# Patient Record
Sex: Male | Born: 1950 | Race: White | Hispanic: No | Marital: Married | Smoking: Never smoker
Health system: Southern US, Community
[De-identification: ages and names within clinical notes are randomized; demographics above are authoritative.]

## PROBLEM LIST (undated history)

## (undated) DIAGNOSIS — H269 Unspecified cataract: Secondary | ICD-10-CM

## (undated) DIAGNOSIS — Z789 Other specified health status: Secondary | ICD-10-CM

## (undated) DIAGNOSIS — K429 Umbilical hernia without obstruction or gangrene: Secondary | ICD-10-CM

## (undated) DIAGNOSIS — C61 Malignant neoplasm of prostate: Secondary | ICD-10-CM

## (undated) DIAGNOSIS — E785 Hyperlipidemia, unspecified: Secondary | ICD-10-CM

## (undated) HISTORY — DX: Umbilical hernia without obstruction or gangrene: K42.9

## (undated) HISTORY — PX: COLONOSCOPY W/ POLYPECTOMY: SHX1380

## (undated) HISTORY — PX: TONSILLECTOMY: SUR1361

## (undated) HISTORY — DX: Hyperlipidemia, unspecified: E78.5

## (undated) HISTORY — DX: Unspecified cataract: H26.9

## (undated) HISTORY — PX: TONSILLECTOMY AND ADENOIDECTOMY: SHX28

## (undated) HISTORY — DX: Malignant neoplasm of prostate: C61

---

## 2011-08-03 ENCOUNTER — Encounter: Payer: Self-pay | Admitting: Internal Medicine

## 2011-08-08 ENCOUNTER — Ambulatory Visit (INDEPENDENT_AMBULATORY_CARE_PROVIDER_SITE_OTHER): Payer: BC Managed Care – PPO | Admitting: Internal Medicine

## 2011-08-08 ENCOUNTER — Encounter: Payer: Self-pay | Admitting: Internal Medicine

## 2011-08-08 DIAGNOSIS — Z1211 Encounter for screening for malignant neoplasm of colon: Secondary | ICD-10-CM

## 2011-08-08 DIAGNOSIS — R5383 Other fatigue: Secondary | ICD-10-CM

## 2011-08-08 DIAGNOSIS — Z13220 Encounter for screening for lipoid disorders: Secondary | ICD-10-CM

## 2011-08-08 DIAGNOSIS — R5381 Other malaise: Secondary | ICD-10-CM

## 2011-08-08 DIAGNOSIS — Z125 Encounter for screening for malignant neoplasm of prostate: Secondary | ICD-10-CM

## 2011-08-08 MED ORDER — LORATADINE 10 MG PO TABS: 10.0000 mg | ORAL_TABLET | Freq: Every day | ORAL | Status: DC

## 2011-08-08 NOTE — Progress Notes (Signed)
  Subjective:    [name], male    DOB: 04/18/1951, 60 y.o.   MRN: [id number]  HPI  Mr. Carlberg is a helathy 60 yr old male who is  Here for his annual physical exam.  He has no medical history, no new complaints.    Review of Systems  Constitutional: Negative for fever, chills, diaphoresis, activity change, appetite change, fatigue and unexpected weight change.  HENT: Negative for hearing loss, ear pain, nosebleeds, congestion, sore throat, facial swelling, rhinorrhea, sneezing, drooling, mouth sores, trouble swallowing, neck pain, neck stiffness, dental problem, voice change, postnasal drip, sinus pressure, tinnitus and ear discharge.   Eyes: Negative for photophobia, pain, discharge, redness, itching and visual disturbance.  Respiratory: Negative for apnea, cough, choking, chest tightness, shortness of breath, wheezing and stridor.   Cardiovascular: Negative for chest pain, palpitations and leg swelling.  Gastrointestinal: Negative for nausea, vomiting, abdominal pain, diarrhea, constipation, blood in stool, abdominal distention, anal bleeding and rectal pain.  Genitourinary: Negative for dysuria, urgency, frequency, hematuria, flank pain, decreased urine volume, scrotal swelling, difficulty urinating and testicular pain.  Musculoskeletal: Negative for myalgias, back pain, joint swelling, arthralgias and gait problem.  Skin: Negative for color change, rash and wound.  Neurological: Negative for dizziness, tremors, seizures, syncope, speech difficulty, weakness, light-headedness, numbness and headaches.  Psychiatric/Behavioral: Negative for suicidal ideas, hallucinations, behavioral problems, confusion, sleep disturbance, dysphoric mood, decreased concentration and agitation. The patient is not nervous/anxious.        Objective:   Physical Exam  Constitutional: He is oriented to person, place, and time.  HENT:  Head: Normocephalic and atraumatic.  Mouth/Throat: Oropharynx  is clear and moist.  Eyes: Conjunctivae and EOM are normal.  Neck: Normal range of motion. Neck supple. No JVD present. No thyromegaly present.  Cardiovascular: Normal rate, regular rhythm and normal heart sounds.   Pulmonary/Chest: Effort normal and breath sounds normal. He has no wheezes. He has no rales.  Abdominal: Soft. Bowel sounds are normal. He exhibits no mass. There is no tenderness. There is no rebound.  Genitourinary: Prostate normal and penis normal.  Musculoskeletal: Normal range of motion. He exhibits no edema.  Neurological: He is alert and oriented to person, place, and time.  Skin: Skin is warm and dry.  Psychiatric: He has a normal mood and affect.          Assessment & Plan:

## 2011-08-09 DIAGNOSIS — Z1211 Encounter for screening for malignant neoplasm of colon: Secondary | ICD-10-CM | POA: Insufficient documentation

## 2011-08-09 DIAGNOSIS — Z125 Encounter for screening for malignant neoplasm of prostate: Secondary | ICD-10-CM | POA: Insufficient documentation

## 2011-08-09 DIAGNOSIS — E785 Hyperlipidemia, unspecified: Secondary | ICD-10-CM | POA: Insufficient documentation

## 2011-08-09 NOTE — Assessment & Plan Note (Signed)
Prostate exam was normal.  He will return for a PSA early next week

## 2011-08-09 NOTE — Assessment & Plan Note (Signed)
He is up to date with screening for colon CA

## 2011-08-09 NOTE — Assessment & Plan Note (Signed)
Lipids have been normal in th past.,  Repeat fasting lipids due.

## 2011-08-14 ENCOUNTER — Other Ambulatory Visit (INDEPENDENT_AMBULATORY_CARE_PROVIDER_SITE_OTHER): Payer: BC Managed Care – PPO | Admitting: *Deleted

## 2011-08-14 DIAGNOSIS — R5381 Other malaise: Secondary | ICD-10-CM

## 2011-08-14 DIAGNOSIS — Z13220 Encounter for screening for lipoid disorders: Secondary | ICD-10-CM

## 2011-08-14 DIAGNOSIS — Z125 Encounter for screening for malignant neoplasm of prostate: Secondary | ICD-10-CM

## 2011-08-14 DIAGNOSIS — R5383 Other fatigue: Secondary | ICD-10-CM

## 2011-08-15 LAB — COMPREHENSIVE METABOLIC PANEL
ALT: 22 U/L (ref 0–53)
AST: 24 U/L (ref 0–37)
Albumin: 4.2 g/dL (ref 3.5–5.2)
Alkaline Phosphatase: 73 U/L (ref 39–117)
BUN: 16 mg/dL (ref 6–23)
CO2: 22 mEq/L (ref 19–32)
Calcium: 9.1 mg/dL (ref 8.4–10.5)
Chloride: 106 mEq/L (ref 96–112)
Creatinine, Ser: 0.9 mg/dL (ref 0.4–1.5)
GFR: 92.06 mL/min (ref >60.00)
Glucose, Bld: 102 mg/dL — ABNORMAL HIGH (ref 70–99)
Potassium: 4.3 mEq/L (ref 3.5–5.1)
Sodium: 139 mEq/L (ref 135–145)
Total Bilirubin: 0.4 mg/dL (ref 0.3–1.2)
Total Protein: 6.8 g/dL (ref 6.0–8.3)

## 2011-08-15 LAB — LIPID PANEL
Cholesterol: 199 mg/dL (ref 0–200)
HDL: 43.2 mg/dL (ref >39.00)
LDL Cholesterol: 119 mg/dL — ABNORMAL HIGH (ref 0–99)
Total CHOL/HDL Ratio: 5
Triglycerides: 186 mg/dL — ABNORMAL HIGH (ref 0.0–149.0)
VLDL: 37.2 mg/dL (ref 0.0–40.0)

## 2011-08-15 LAB — PSA, TOTAL AND FREE
PSA, Free Pct: 13 % — ABNORMAL LOW (ref >25)
PSA, Free: 0.34 ng/mL
PSA: 2.67 ng/mL (ref <=4.00)

## 2011-10-19 ENCOUNTER — Other Ambulatory Visit: Payer: Self-pay | Admitting: Internal Medicine

## 2012-06-12 ENCOUNTER — Other Ambulatory Visit: Payer: Self-pay | Admitting: Internal Medicine

## 2012-10-22 ENCOUNTER — Encounter: Payer: Self-pay | Admitting: Internal Medicine

## 2012-10-22 ENCOUNTER — Ambulatory Visit (INDEPENDENT_AMBULATORY_CARE_PROVIDER_SITE_OTHER): Payer: BC Managed Care – PPO | Admitting: Internal Medicine

## 2012-10-22 VITALS — BP 110/64 | HR 66 | Temp 98.6°F | Resp 12 | Ht 69.0 in | Wt 181.0 lb

## 2012-10-22 DIAGNOSIS — R5383 Other fatigue: Secondary | ICD-10-CM

## 2012-10-22 DIAGNOSIS — R5381 Other malaise: Secondary | ICD-10-CM

## 2012-10-22 DIAGNOSIS — Z13220 Encounter for screening for lipoid disorders: Secondary | ICD-10-CM

## 2012-10-22 DIAGNOSIS — M25519 Pain in unspecified shoulder: Secondary | ICD-10-CM

## 2012-10-22 DIAGNOSIS — Z125 Encounter for screening for malignant neoplasm of prostate: Secondary | ICD-10-CM

## 2012-10-22 DIAGNOSIS — M25511 Pain in right shoulder: Secondary | ICD-10-CM

## 2012-10-22 DIAGNOSIS — R350 Frequency of micturition: Secondary | ICD-10-CM

## 2012-10-22 DIAGNOSIS — Z23 Encounter for immunization: Secondary | ICD-10-CM

## 2012-10-22 DIAGNOSIS — Z0000 Encounter for general adult medical examination without abnormal findings: Secondary | ICD-10-CM

## 2012-10-22 LAB — FECAL OCCULT BLOOD, GUAIAC: Fecal Occult Blood: NEGATIVE

## 2012-10-22 NOTE — Patient Instructions (Addendum)
Your fecal occult blood test  Was negative (normal)  Ibuprofen can be taken 800 mg every 8 hours  To heal your shoulder .  No weight lifting or strenous activities with shoulder for 6 weeks  Please take Omeprazole 20 mg daily to protect stomach.  Take 20 minutes prior to all food.   If shoulder has not imporved in 4 to 6 weeks ,  Call for referral to orthopedics for steroid injection   Return next week for fasting labs and PSA  You received your TDap vaccine today (tetanus diphtheria and pertussis)

## 2012-10-22 NOTE — Progress Notes (Signed)
[name], male   DOB: [birth year], 61 y.o.   MRN: [id number]  Patient Active Problem List  Diagnosis  . Screening for prostate cancer  . Screening for colon cancer  . Screening for lipoid disorders  . Routine general medical examination at a health care facility    Subjective:  CC:   Chief Complaint  Patient presents with  . Annual Exam    HPI:   Edward Scites Brooksis a 61 y.o. male who presents for his annual exam.  He has been having mild right shoulder pain which occurred initially one  week ago.  His evaluation included normal x rays, heating pads and ibuprofen 200 mg once daily  2)he has been having  urinary urgency without nocturia.  Wonders if prostate is enlarged.  3) He has been making a concerted effort to manage his  Weight with a carbohydrate restricted diet and regular exercise.    History reviewed. No pertinent past medical history.  History reviewed. No pertinent past surgical history.       The following portions of the patient's history were reviewed and updated as appropriate: Allergies, current medications, and problem list.    Review of Systems:   12 Pt  review of systems was negative except those addressed in the HPI,     History   Social History  . Marital Status: Married    Spouse Name: N/A    Number of Children: N/A  . Years of Education: N/A   Occupational History  . Not on file.   Social History Main Topics  . Smoking status: Never Smoker   . Smokeless tobacco: Never Used  . Alcohol Use: Yes     Comment: moderate  . Drug Use: No  . Sexually Active: Not on file   Other Topics Concern  . Not on file   Social History Narrative  . No narrative on file    Objective:  BP 110/64  Pulse 66  Temp 98.6 F (37 C) (Oral)  Resp 12  Ht 5\' 9"  (1.753 m)  Wt 181 lb (82.101 kg)  BMI 26.73 kg/m2  SpO2 97%   General Appearance:    Alert, cooperative, no distress, appears stated age  Head:    Normocephalic, without  obvious abnormality, atraumatic  Eyes:    PERRL, conjunctiva/corneas clear, EOM's intact, fundi    benign, both eyes       Ears:    Normal TM's and external ear canals, both ears  Nose:   Nares normal, septum midline, mucosa normal, no drainage   or sinus tenderness  Throat:   Lips, mucosa, and tongue normal; teeth and gums normal  Neck:   Supple, symmetrical, trachea midline, no adenopathy;       thyroid:  No enlargement/tenderness/nodules; no carotid   bruit or JVD  Back:     Symmetric, no curvature, ROM normal, no CVA tenderness  Lungs:     Clear to auscultation bilaterally, respirations unlabored  Chest wall:    No tenderness or deformity  Heart:    Regular rate and rhythm, S1 and S2 normal, no murmur, rub   or gallop  Abdomen:     Soft, non-tender, bowel sounds active all four quadrants,    no masses, no organomegaly  Genitalia:    Normal male without lesion, discharge or tenderness  Rectal:    Normal tone, normal prostate, no masses or tenderness;   guaiac negative stool  Extremities:   Extremities normal, atraumatic, no cyanosis  or edema  Pulses:   2+ and symmetric all extremities  Skin:   Skin color, texture, turgor normal, no rashes or lesions  Lymph nodes:   Cervical, supraclavicular, and axillary nodes normal  Neurologic:   CNII-XII intact. Normal strength, sensation and reflexes      throughout    Assessment and Plan:  Routine general medical examination at a health care facility Comprehensive exam including Testicular and DRE were normal. Return in one year.   Screening for lipoid disorders LDL was 119 and trigs 186 last year.  He will return for fasting lbs next week  Shoulder pain, right With normal exam.  Likely mild tendonitis from weight lifting.  NSAIDs rest of shoulder. prior films requested.   Screening for prostate cancer DRE was normal.  PSA to be done next week    Updated Medication List Outpatient Encounter Prescriptions as of 10/22/2012    Medication Sig Dispense Refill  . BEE POLLEN PO Take 1 tablet by mouth daily.        Marland Kitchen loratadine (CLARITIN) 10 MG tablet TAKE 1 TABLET BY MOUTH EVERY DAY FOR ALLERGIES  30 tablet  7  . Specialty Vitamins Products (ULTRA MAN PO) Take 1 tablet by mouth daily.

## 2012-10-25 ENCOUNTER — Encounter: Payer: Self-pay | Admitting: Internal Medicine

## 2012-10-25 DIAGNOSIS — M25511 Pain in right shoulder: Secondary | ICD-10-CM | POA: Insufficient documentation

## 2012-10-25 DIAGNOSIS — Z125 Encounter for screening for malignant neoplasm of prostate: Secondary | ICD-10-CM | POA: Insufficient documentation

## 2012-10-25 DIAGNOSIS — Z8546 Personal history of malignant neoplasm of prostate: Secondary | ICD-10-CM | POA: Insufficient documentation

## 2012-10-25 DIAGNOSIS — Z0000 Encounter for general adult medical examination without abnormal findings: Secondary | ICD-10-CM | POA: Insufficient documentation

## 2012-10-25 NOTE — Assessment & Plan Note (Signed)
Comprehensive exam including Testicular and DRE were normal. Return in one year.

## 2012-10-25 NOTE — Assessment & Plan Note (Signed)
LDL was 119 and trigs 186 last year.  He will return for fasting lbs next week

## 2012-10-25 NOTE — Assessment & Plan Note (Signed)
With normal exam.  Likely mild tendonitis from weight lifting.  NSAIDs rest of shoulder. prior films requested.

## 2012-10-25 NOTE — Assessment & Plan Note (Signed)
DRE was normal.  PSA to be done next week

## 2012-10-27 ENCOUNTER — Other Ambulatory Visit (INDEPENDENT_AMBULATORY_CARE_PROVIDER_SITE_OTHER): Payer: BC Managed Care – PPO

## 2012-10-27 DIAGNOSIS — Z13220 Encounter for screening for lipoid disorders: Secondary | ICD-10-CM

## 2012-10-27 DIAGNOSIS — R5381 Other malaise: Secondary | ICD-10-CM

## 2012-10-27 DIAGNOSIS — R5383 Other fatigue: Secondary | ICD-10-CM

## 2012-10-27 DIAGNOSIS — Z125 Encounter for screening for malignant neoplasm of prostate: Secondary | ICD-10-CM

## 2012-10-27 LAB — CBC WITH DIFFERENTIAL/PLATELET
Basophils Absolute: 0 10*3/uL (ref 0.0–0.1)
Basophils Relative: 0.3 % (ref 0.0–3.0)
Eosinophils Absolute: 0.2 10*3/uL (ref 0.0–0.7)
Eosinophils Relative: 2.9 % (ref 0.0–5.0)
HCT: 47.3 % (ref 39.0–52.0)
Hemoglobin: 15.7 g/dL (ref 13.0–17.0)
Lymphocytes Relative: 18.5 % (ref 12.0–46.0)
Lymphs Abs: 1 10*3/uL (ref 0.7–4.0)
MCHC: 33.2 g/dL (ref 30.0–36.0)
MCV: 92.9 fl (ref 78.0–100.0)
Monocytes Absolute: 0.7 10*3/uL (ref 0.1–1.0)
Monocytes Relative: 12.7 % — ABNORMAL HIGH (ref 3.0–12.0)
Neutro Abs: 3.5 10*3/uL (ref 1.4–7.7)
Neutrophils Relative %: 65.6 % (ref 43.0–77.0)
Platelets: 215 10*3/uL (ref 150.0–400.0)
RBC: 5.08 Mil/uL (ref 4.22–5.81)
RDW: 12.9 % (ref 11.5–14.6)
WBC: 5.3 10*3/uL (ref 4.5–10.5)

## 2012-10-27 LAB — COMPREHENSIVE METABOLIC PANEL
ALT: 27 U/L (ref 0–53)
AST: 27 U/L (ref 0–37)
Albumin: 4.1 g/dL (ref 3.5–5.2)
Alkaline Phosphatase: 68 U/L (ref 39–117)
BUN: 19 mg/dL (ref 6–23)
CO2: 28 mEq/L (ref 19–32)
Calcium: 9.4 mg/dL (ref 8.4–10.5)
Chloride: 104 mEq/L (ref 96–112)
Creatinine, Ser: 0.8 mg/dL (ref 0.4–1.5)
GFR: 102.07 mL/min (ref >60.00)
Glucose, Bld: 98 mg/dL (ref 70–99)
Potassium: 4.4 mEq/L (ref 3.5–5.1)
Sodium: 137 mEq/L (ref 135–145)
Total Bilirubin: 0.6 mg/dL (ref 0.3–1.2)
Total Protein: 7.2 g/dL (ref 6.0–8.3)

## 2012-10-27 LAB — LIPID PANEL
Cholesterol: 192 mg/dL (ref 0–200)
HDL: 43.6 mg/dL (ref >39.00)
LDL Cholesterol: 120 mg/dL — ABNORMAL HIGH (ref 0–99)
Total CHOL/HDL Ratio: 4
Triglycerides: 144 mg/dL (ref 0.0–149.0)
VLDL: 28.8 mg/dL (ref 0.0–40.0)

## 2012-10-27 LAB — PSA: PSA: 3.66 ng/mL (ref 0.10–4.00)

## 2012-10-27 LAB — TSH: TSH: 1.86 u[IU]/mL (ref 0.35–5.50)

## 2012-12-04 ENCOUNTER — Encounter: Payer: Self-pay | Admitting: Internal Medicine

## 2012-12-04 ENCOUNTER — Ambulatory Visit (INDEPENDENT_AMBULATORY_CARE_PROVIDER_SITE_OTHER): Payer: BC Managed Care – PPO | Admitting: Internal Medicine

## 2012-12-04 VITALS — BP 108/74 | HR 75 | Temp 98.6°F | Resp 16 | Ht 69.0 in | Wt 181.0 lb

## 2012-12-04 DIAGNOSIS — G8929 Other chronic pain: Secondary | ICD-10-CM | POA: Insufficient documentation

## 2012-12-04 DIAGNOSIS — M75100 Unspecified rotator cuff tear or rupture of unspecified shoulder, not specified as traumatic: Secondary | ICD-10-CM

## 2012-12-04 DIAGNOSIS — M25519 Pain in unspecified shoulder: Secondary | ICD-10-CM

## 2012-12-04 DIAGNOSIS — M25511 Pain in right shoulder: Secondary | ICD-10-CM

## 2012-12-04 DIAGNOSIS — M7552 Bursitis of left shoulder: Secondary | ICD-10-CM | POA: Insufficient documentation

## 2012-12-04 DIAGNOSIS — M7550 Bursitis of unspecified shoulder: Secondary | ICD-10-CM

## 2012-12-04 DIAGNOSIS — IMO0002 Reserved for concepts with insufficient information to code with codable children: Secondary | ICD-10-CM

## 2012-12-04 MED ORDER — METHYLPREDNISOLONE ACETATE 40 MG/ML IJ SUSP: 40.0000 mg | Freq: Once | INTRAMUSCULAR | Status: DC

## 2012-12-04 MED ORDER — LIDOCAINE HCL 2 % IJ SOLN: 5.0000 mL | Freq: Once | INTRAMUSCULAR | Status: DC

## 2012-12-04 NOTE — Progress Notes (Signed)
Patient ID: DYAMI UMBACH, male   DOB: 09-25-1951, 62 y.o.   MRN: [id number]   Patient Active Problem List  Diagnosis  . Screening for prostate cancer  . Screening for colon cancer  . Screening for lipoid disorders  . Routine general medical examination at a health care facility  . Shoulder pain, right  . Subacromial or subdeltoid bursitis    Subjective:  CC:   Chief Complaint  Patient presents with  . Shoulder Problem    right  . cortizone injection    HPI:   Nickson Middlesworth Brooksis a 62 y.o. male who presents  History reviewed. No pertinent past medical history.  History reviewed. No pertinent past surgical history.     . lidocaine  5 mL Infiltration Once  . methylPREDNISolone acetate  40 mg Intra-articular Once     The following portions of the patient's history were reviewed and updated as appropriate: Allergies, current medications, and problem list.    Review of Systems:   12 Pt  review of systems was negative except those addressed in the HPI,     History   Social History  . Marital Status: Married    Spouse Name: N/A    Number of Children: N/A  . Years of Education: N/A   Occupational History  . Not on file.   Social History Main Topics  . Smoking status: Never Smoker   . Smokeless tobacco: Never Used  . Alcohol Use: Yes     Comment: moderate  . Drug Use: No  . Sexually Active: Not on file   Other Topics Concern  . Not on file   Social History Narrative  . No narrative on file    Objective:  BP 108/74  Pulse 75  Temp 98.6 F (37 C) (Oral)  Resp 16  Ht 5\' 9"  (1.753 m)  Wt 181 lb (82.101 kg)  BMI 26.73 kg/m2  SpO2 97%  General appearance: alert, cooperative and appears stated age Ears: normal TM's and external ear canals both ears Throat: lips, mucosa, and tongue normal; teeth and gums normal Neck: no adenopathy, no carotid bruit, supple, symmetrical, trachea midline and thyroid not enlarged, symmetric, no  tenderness/mass/nodules Back: symmetric, no curvature. ROM normal. No CVA tenderness. Lungs: clear to auscultation bilaterally Heart: regular rate and rhythm, S1, S2 normal, no murmur, click, rub or gallop Abdomen: soft, non-tender; bowel sounds normal; no masses,  no organomegaly Pulses: 2+ and symmetric Skin: Skin color, texture, turgor normal. No rashes or lesions Lymph nodes: Cervical, supraclavicular, and axillary nodes normal.  Assessment and Plan:  Subacromial or subdeltoid bursitis Right shoulder,  Aggravated by cleaning of window sills several months ago, with history of remote high school injury which was never really addressed. Recent x-rays were normal. He's had no response to steroids. After informed consent was obtained the requested steroid injection was given in the subacromial bursa using sterile procedure. There are no immediate complications. He received 40 mg of Depo-Medrol and 4 mL of lidocaine was instructed to avoid all overhead lifting for 2 weeks and to ice the shoulder as needed.    Updated Medication List Outpatient Encounter Prescriptions as of 12/04/2012  Medication Sig Dispense Refill  . BEE POLLEN PO Take 1 tablet by mouth daily.        Marland Kitchen loratadine (CLARITIN) 10 MG tablet TAKE 1 TABLET BY MOUTH EVERY DAY FOR ALLERGIES  30 tablet  7  . Specialty Vitamins Products (ULTRA MAN PO) Take 1 tablet by mouth daily.  Facility-Administered Encounter Medications as of 12/04/2012  Medication Dose Route Frequency Provider Last Rate Last Dose  . lidocaine (XYLOCAINE) 2 % (with pres) injection 100 mg  5 mL Infiltration Once Sherlene Shams, MD      . methylPREDNISolone acetate (DEPO-MEDROL) injection 40 mg  40 mg Intra-articular Once Sherlene Shams, MD         No orders of the defined types were placed in this encounter.    No Follow-up on file.

## 2012-12-04 NOTE — Assessment & Plan Note (Signed)
Right shoulder,  Aggravated by cleaning of window sills several months ago, prior remote inhury i nhigh school which was never really addressed bc x rays were normal.  No response to NSAIDs.  Steroid injection requested and given today 40 mg depo medrol 4 ml xylocaine .  No complications

## 2012-12-15 ENCOUNTER — Telehealth: Payer: Self-pay | Admitting: General Practice

## 2012-12-15 NOTE — Telephone Encounter (Signed)
Pt called stating that he needs a Paramedical Exam/Blood draw/urine samples. Can you schedule a 30 min. Thanks

## 2012-12-16 NOTE — Telephone Encounter (Signed)
Left message for pt to call back and schedule

## 2013-03-02 ENCOUNTER — Telehealth: Payer: Self-pay | Admitting: Internal Medicine

## 2013-03-02 DIAGNOSIS — M7551 Bursitis of right shoulder: Secondary | ICD-10-CM

## 2013-03-02 NOTE — Telephone Encounter (Signed)
Pt called and he would like to get referral to [facility] for his right shoulder pain or does he need to see dr Darrick Huntsman

## 2013-03-02 NOTE — Telephone Encounter (Signed)
Pt states that he had received a cortisone a few months ago. States that the arm still feels weak,with a dull ache. States that at this appt you had suggested a referral. Please advise.

## 2013-03-02 NOTE — Telephone Encounter (Signed)
Referral is in process as requested 

## 2013-03-03 NOTE — Telephone Encounter (Signed)
Noted  

## 2013-03-10 ENCOUNTER — Other Ambulatory Visit: Payer: Self-pay | Admitting: Internal Medicine

## 2013-03-10 NOTE — Telephone Encounter (Signed)
Med filled.  

## 2013-05-10 ENCOUNTER — Other Ambulatory Visit: Payer: Self-pay | Admitting: Internal Medicine

## 2013-10-26 ENCOUNTER — Encounter: Payer: Self-pay | Admitting: Internal Medicine

## 2013-10-26 ENCOUNTER — Ambulatory Visit (INDEPENDENT_AMBULATORY_CARE_PROVIDER_SITE_OTHER): Payer: BC Managed Care – PPO | Admitting: Internal Medicine

## 2013-10-26 VITALS — BP 114/72 | HR 73 | Temp 98.3°F | Resp 12 | Ht 68.0 in | Wt 184.5 lb

## 2013-10-26 DIAGNOSIS — Z1211 Encounter for screening for malignant neoplasm of colon: Secondary | ICD-10-CM

## 2013-10-26 DIAGNOSIS — Z8249 Family history of ischemic heart disease and other diseases of the circulatory system: Secondary | ICD-10-CM

## 2013-10-26 DIAGNOSIS — Z823 Family history of stroke: Secondary | ICD-10-CM

## 2013-10-26 DIAGNOSIS — Z125 Encounter for screening for malignant neoplasm of prostate: Secondary | ICD-10-CM

## 2013-10-26 DIAGNOSIS — R51 Headache: Secondary | ICD-10-CM

## 2013-10-26 DIAGNOSIS — R5381 Other malaise: Secondary | ICD-10-CM

## 2013-10-26 DIAGNOSIS — R519 Headache, unspecified: Secondary | ICD-10-CM

## 2013-10-26 DIAGNOSIS — Z0000 Encounter for general adult medical examination without abnormal findings: Secondary | ICD-10-CM

## 2013-10-26 DIAGNOSIS — E785 Hyperlipidemia, unspecified: Secondary | ICD-10-CM

## 2013-10-26 MED ORDER — ZOSTER VACCINE LIVE 19400 UNT/0.65ML ~~LOC~~ SOLR: 0.6500 mL | Freq: Once | SUBCUTANEOUS | Status: DC

## 2013-10-26 NOTE — Patient Instructions (Signed)
You had your annual  wellness exam today  We will schedule your MRI soon.  Please use the stool kit to send [place] back a sample to test for blood.  This is your colon CA screening test.   You do not need the Pneumonia vaccine until 65!!  If you decide to get the Shingles vaccine.  I have given you prescriptions for it becauseitwill be cheaper at the health Dept or at your local pharmacy   Please return at your convenience for fasting blood work We will contact you with the bloodwork results

## 2013-10-26 NOTE — Assessment & Plan Note (Signed)
history of  polyps on last one ,  Coming up due for 5 ys.  FOBt was negative  Last year.

## 2013-10-26 NOTE — Progress Notes (Signed)
Pre-visit discussion using our clinic review tool. No additional management support is needed unless otherwise documented below in the visit note.  

## 2013-10-27 DIAGNOSIS — Z8249 Family history of ischemic heart disease and other diseases of the circulatory system: Secondary | ICD-10-CM | POA: Insufficient documentation

## 2013-10-27 DIAGNOSIS — R519 Headache, unspecified: Secondary | ICD-10-CM | POA: Insufficient documentation

## 2013-10-27 NOTE — Progress Notes (Signed)
[name], male   DOB: 1951/02/25, 62 y.o.   MRN: [id number]  The patient is here for his annual male physical examination and management of other chronic and acute problems.  He has been having recurrent headaches occurring two to three times per week, often accompanied by vertigo.  Have been occurring for the past 3 months. No chronic NSAID use  No history of cervical spinal stenosis or neck inury.  Has strong maternal histroy of cerebral aneurysm (mother and grandmother) no priro MRI    The risk factors are reflected in the social history.  The roster of all physicians providing medical care to patient - is listed in the Snapshot section of the chart.  Activities of daily living:  The patient is 100% independent in all ADLs: dressing, toileting, feeding as well as independent mobility  Home safety : The patient has smoke detectors in the home. He wears seatbelts.  There are no firearms at home. There is no violence in the home.   There is no risks for hepatitis, STDs or HIV. There is no   history of blood transfusion. There is no travel history to infectious disease endemic areas of the world.  The patient has seen their dentist in the last six month and  their eye doctor in the last year.  They do not  have excessive sun exposure. They have seen a dermatoloigist in the last year. Discussed the need for sun protection: hats, long sleeves and use of sunscreen if there is significant sun exposure.   Diet: the importance of a healthy diet is discussed. They do have a healthy diet.  The benefits of regular aerobic exercise were discussed. He exercises a minimum of 30 minutes  5 days per week. Depression screen: there are no signs or vegative symptoms of depression- irritability, change in appetite, anhedonia, sadness/tearfullness.  The following portions of the patient's history were reviewed and updated as appropriate: allergies, current medications, past family history, past  medical history,  past surgical history, past social history  and problem list.  Visual acuity was not assessed per patient preference since he has regular follow up with his ophthalmologist. Hearing and body mass index were assessed and reviewed.   During the course of the visit the patient was educated and counseled about appropriate screening and preventive services including :  nutrition counseling, colorectal cancer screening, and recommended immunizations.    Objective:   BP 114/72  Pulse 73  Temp(Src) 98.3 F (36.8 C) (Oral)  Resp 12  Ht 5\' 8"  (1.727 m)  Wt 184 lb 8 oz (83.689 kg)  BMI 28.06 kg/m2  SpO2 98%  General Appearance:    Alert, cooperative, no distress, appears stated age  Head:    Normocephalic, without obvious abnormality, atraumatic  Eyes:    PERRL, conjunctiva/corneas clear, EOM's intact, fundi    benign, both eyes       Ears:    Normal TM's and external ear canals, both ears  Nose:   Nares normal, septum midline, mucosa normal, no drainage   or sinus tenderness  Throat:   Lips, mucosa, and tongue normal; teeth and gums normal  Neck:   Supple, symmetrical, trachea midline, no adenopathy;       thyroid:  No enlargement/tenderness/nodules; no carotid   bruit or JVD  Back:     Symmetric, no curvature, ROM normal, no CVA tenderness  Lungs:     Clear to auscultation bilaterally, respirations unlabored  Chest wall:  No tenderness or deformity  Heart:    Regular rate and rhythm, S1 and S2 normal, no murmur, rub   or gallop  Abdomen:     Soft, non-tender, bowel sounds active all four quadrants,    no masses, no organomegaly  Genitalia:    Normal male without lesion, discharge or tenderness  Rectal:    Normal tone, normal prostate, no masses or tenderness;   guaiac negative stool  Extremities:   Extremities normal, atraumatic, no cyanosis or edema  Pulses:   2+ and symmetric all extremities  Skin:   Skin color, texture, turgor normal, no rashes or lesions   Lymph nodes:   Cervical, supraclavicular, and axillary nodes normal  Neurologic:   CNII-XII intact. Normal strength, sensation and reflexes      throughout   Assessment and plan:  Screening for colon cancer history of  polyps on last one ,  Coming up due for 5 ys.  FOBt was negative  Last year.   Routine general medical examination at a health care facility Annual male exam was done including testicular and prostate exam. PSA is pending .  Colon ca screening was reviewed and options given.    Headache behind the eyes Neuro exam normal and he has no signs of sinus disease. Or cervical spine arthritis on exam.   Given strong FH of aneurysm with mother having ICH at age 60.,  will refer for brain MR/MRA>    Updated Medication List Outpatient Encounter Prescriptions as of 10/26/2013  Medication Sig  . loratadine (CLARITIN) 10 MG tablet TAKE 1 TABLET BY MOUTH EVERY DAY FOR ALLERGIES  . zoster vaccine live, PF, (ZOSTAVAX) 29562 UNT/0.65ML injection Inject 19,400 Units into the skin once.  . [DISCONTINUED] BEE POLLEN PO Take 1 tablet by mouth daily.    . [DISCONTINUED] Specialty Vitamins Products (ULTRA MAN PO) Take 1 tablet by mouth daily.

## 2013-10-27 NOTE — Assessment & Plan Note (Signed)
Annual male exam was done including testicular and prostate exam. PSA is pending .  Colon ca screening was reviewed and options given.   

## 2013-10-27 NOTE — Assessment & Plan Note (Signed)
Neuro exam normal and he has no signs of sinus disease. Or cervical spine arthritis on exam.   Given strong FH of aneurysm with mother having ICH at age 60.,  will refer for brain MR/MRA>

## 2013-11-04 ENCOUNTER — Other Ambulatory Visit (INDEPENDENT_AMBULATORY_CARE_PROVIDER_SITE_OTHER): Payer: BC Managed Care – PPO

## 2013-11-04 DIAGNOSIS — R5381 Other malaise: Secondary | ICD-10-CM

## 2013-11-04 DIAGNOSIS — Z125 Encounter for screening for malignant neoplasm of prostate: Secondary | ICD-10-CM

## 2013-11-04 DIAGNOSIS — E785 Hyperlipidemia, unspecified: Secondary | ICD-10-CM

## 2013-11-04 LAB — LIPID PANEL
Cholesterol: 205 mg/dL — ABNORMAL HIGH (ref 0–200)
HDL: 42.9 mg/dL (ref >39.00)
Total CHOL/HDL Ratio: 5
Triglycerides: 115 mg/dL (ref 0.0–149.0)
VLDL: 23 mg/dL (ref 0.0–40.0)

## 2013-11-04 LAB — CBC WITH DIFFERENTIAL/PLATELET
Basophils Absolute: 0 10*3/uL (ref 0.0–0.1)
Basophils Relative: 0.3 % (ref 0.0–3.0)
Eosinophils Absolute: 0.1 10*3/uL (ref 0.0–0.7)
Eosinophils Relative: 1.6 % (ref 0.0–5.0)
HCT: 47.4 % (ref 39.0–52.0)
Hemoglobin: 16.3 g/dL (ref 13.0–17.0)
Lymphocytes Relative: 20.3 % (ref 12.0–46.0)
Lymphs Abs: 1.2 10*3/uL (ref 0.7–4.0)
MCHC: 34.5 g/dL (ref 30.0–36.0)
MCV: 90.3 fl (ref 78.0–100.0)
Monocytes Absolute: 0.8 10*3/uL (ref 0.1–1.0)
Monocytes Relative: 13.1 % — ABNORMAL HIGH (ref 3.0–12.0)
Neutro Abs: 3.8 10*3/uL (ref 1.4–7.7)
Neutrophils Relative %: 64.7 % (ref 43.0–77.0)
Platelets: 223 10*3/uL (ref 150.0–400.0)
RBC: 5.24 Mil/uL (ref 4.22–5.81)
RDW: 12 % (ref 11.5–14.6)
WBC: 5.9 10*3/uL (ref 4.5–10.5)

## 2013-11-04 LAB — COMPREHENSIVE METABOLIC PANEL
ALT: 31 U/L (ref 0–53)
AST: 21 U/L (ref 0–37)
Albumin: 4 g/dL (ref 3.5–5.2)
Alkaline Phosphatase: 65 U/L (ref 39–117)
BUN: 19 mg/dL (ref 6–23)
CO2: 28 mEq/L (ref 19–32)
Calcium: 9.6 mg/dL (ref 8.4–10.5)
Chloride: 105 mEq/L (ref 96–112)
Creatinine, Ser: 0.9 mg/dL (ref 0.4–1.5)
GFR: 93.76 mL/min (ref >60.00)
Glucose, Bld: 105 mg/dL — ABNORMAL HIGH (ref 70–99)
Potassium: 5.2 mEq/L — ABNORMAL HIGH (ref 3.5–5.1)
Sodium: 138 mEq/L (ref 135–145)
Total Bilirubin: 0.7 mg/dL (ref 0.3–1.2)
Total Protein: 6.5 g/dL (ref 6.0–8.3)

## 2013-11-04 LAB — TSH: TSH: 2.72 u[IU]/mL (ref 0.35–5.50)

## 2013-11-04 LAB — LDL CHOLESTEROL, DIRECT: Direct LDL: 146.7 mg/dL

## 2013-11-04 LAB — PSA: PSA: 2.24 ng/mL (ref 0.10–4.00)

## 2013-11-05 ENCOUNTER — Telehealth: Payer: Self-pay | Admitting: Internal Medicine

## 2013-11-05 DIAGNOSIS — Z8249 Family history of ischemic heart disease and other diseases of the circulatory system: Secondary | ICD-10-CM

## 2013-11-05 NOTE — Telephone Encounter (Signed)
His MRI of brain was normal.  No anuerysms.

## 2013-11-05 NOTE — Telephone Encounter (Signed)
Left message for patient to call office,

## 2013-11-05 NOTE — Assessment & Plan Note (Signed)
MRA of circle of [place] was normal

## 2013-11-06 ENCOUNTER — Other Ambulatory Visit (INDEPENDENT_AMBULATORY_CARE_PROVIDER_SITE_OTHER): Payer: BC Managed Care – PPO

## 2013-11-06 DIAGNOSIS — Z1211 Encounter for screening for malignant neoplasm of colon: Secondary | ICD-10-CM

## 2013-11-06 LAB — FECAL OCCULT BLOOD, IMMUNOCHEMICAL: Fecal Occult Bld: NEGATIVE

## 2013-11-06 NOTE — Telephone Encounter (Signed)
Patient notified of results.

## 2013-11-08 MED ORDER — SIMVASTATIN 20 MG PO TABS: 20.0000 mg | ORAL_TABLET | Freq: Every day | ORAL | Status: DC

## 2013-11-08 NOTE — Addendum Note (Signed)
Addended by: Sherlene Shams on: 11/08/2013 10:04 PM   Modules accepted: Orders

## 2013-11-09 ENCOUNTER — Telehealth: Payer: Self-pay | Admitting: Internal Medicine

## 2013-11-09 ENCOUNTER — Encounter: Payer: Self-pay | Admitting: *Deleted

## 2013-11-09 NOTE — Telephone Encounter (Signed)
Pt left vm.  States he agreed to begin Zocor.  Has not been called to [facility] [address] [place].  Asking for call [phone] to confirm it has been called in.

## 2013-11-10 NOTE — Telephone Encounter (Signed)
Spoke with pt, had picked up Rx yesterday and has started.

## 2013-11-19 ENCOUNTER — Encounter: Payer: Self-pay | Admitting: Internal Medicine

## 2013-12-17 ENCOUNTER — Telehealth: Payer: Self-pay | Admitting: Internal Medicine

## 2013-12-17 NOTE — Telephone Encounter (Signed)
The patient is wanting a prescription for his shingle shot called to the pharmacy.

## 2013-12-17 NOTE — Telephone Encounter (Signed)
Please advise 

## 2013-12-18 MED ORDER — ZOSTER VACCINE LIVE 19400 UNT/0.65ML ~~LOC~~ SOLR: 0.6500 mL | Freq: Once | SUBCUTANEOUS | Status: DC

## 2013-12-18 NOTE — Telephone Encounter (Signed)
Done,

## 2013-12-29 ENCOUNTER — Telehealth: Payer: Self-pay | Admitting: *Deleted

## 2013-12-29 ENCOUNTER — Other Ambulatory Visit (INDEPENDENT_AMBULATORY_CARE_PROVIDER_SITE_OTHER): Payer: BC Managed Care – PPO

## 2013-12-29 DIAGNOSIS — E785 Hyperlipidemia, unspecified: Secondary | ICD-10-CM

## 2013-12-29 DIAGNOSIS — Z79899 Other long term (current) drug therapy: Secondary | ICD-10-CM

## 2013-12-29 LAB — COMPREHENSIVE METABOLIC PANEL
ALT: 26 U/L (ref 0–53)
AST: 24 U/L (ref 0–37)
Albumin: 4.2 g/dL (ref 3.5–5.2)
Alkaline Phosphatase: 77 U/L (ref 39–117)
BUN: 16 mg/dL (ref 6–23)
CO2: 26 mEq/L (ref 19–32)
Calcium: 9.4 mg/dL (ref 8.4–10.5)
Chloride: 109 mEq/L (ref 96–112)
Creatinine, Ser: 1 mg/dL (ref 0.4–1.5)
GFR: 84.76 mL/min (ref >60.00)
Glucose, Bld: 96 mg/dL (ref 70–99)
Potassium: 5.2 mEq/L — ABNORMAL HIGH (ref 3.5–5.1)
Sodium: 142 mEq/L (ref 135–145)
Total Bilirubin: 0.8 mg/dL (ref 0.3–1.2)
Total Protein: 6.8 g/dL (ref 6.0–8.3)

## 2013-12-29 LAB — LIPID PANEL
Cholesterol: 135 mg/dL (ref 0–200)
HDL: 43.6 mg/dL (ref >39.00)
LDL Cholesterol: 79 mg/dL (ref 0–99)
Total CHOL/HDL Ratio: 3
Triglycerides: 61 mg/dL (ref 0.0–149.0)
VLDL: 12.2 mg/dL (ref 0.0–40.0)

## 2013-12-29 NOTE — Telephone Encounter (Signed)
What labs and dx?  

## 2013-12-31 ENCOUNTER — Encounter: Payer: Self-pay | Admitting: Internal Medicine

## 2014-04-20 ENCOUNTER — Encounter: Payer: Self-pay | Admitting: Internal Medicine

## 2014-04-20 MED ORDER — PHENTERMINE HCL 37.5 MG PO TABS: 37.5000 mg | ORAL_TABLET | Freq: Every day | ORAL | Status: DC

## 2014-04-20 NOTE — Telephone Encounter (Signed)
phetermine rx printed.  E mail sent to patient

## 2014-06-14 LAB — HM COLONOSCOPY

## 2014-06-16 ENCOUNTER — Encounter: Payer: Self-pay | Admitting: *Deleted

## 2014-06-20 ENCOUNTER — Other Ambulatory Visit: Payer: Self-pay | Admitting: Internal Medicine

## 2014-06-20 DIAGNOSIS — D126 Benign neoplasm of colon, unspecified: Secondary | ICD-10-CM

## 2014-07-06 ENCOUNTER — Encounter: Payer: Self-pay | Admitting: Internal Medicine

## 2014-07-15 ENCOUNTER — Encounter: Payer: Self-pay | Admitting: Internal Medicine

## 2014-07-17 ENCOUNTER — Encounter: Payer: Self-pay | Admitting: Internal Medicine

## 2014-07-19 ENCOUNTER — Telehealth: Payer: Self-pay | Admitting: *Deleted

## 2014-07-19 DIAGNOSIS — R5383 Other fatigue: Secondary | ICD-10-CM

## 2014-07-19 DIAGNOSIS — R5381 Other malaise: Secondary | ICD-10-CM

## 2014-07-19 DIAGNOSIS — E785 Hyperlipidemia, unspecified: Secondary | ICD-10-CM

## 2014-07-19 NOTE — Telephone Encounter (Signed)
Pt is coming in tomorrow what labs and dx?  

## 2014-07-20 ENCOUNTER — Other Ambulatory Visit (INDEPENDENT_AMBULATORY_CARE_PROVIDER_SITE_OTHER): Payer: BC Managed Care – PPO

## 2014-07-20 DIAGNOSIS — Z139 Encounter for screening, unspecified: Secondary | ICD-10-CM

## 2014-07-20 DIAGNOSIS — R5381 Other malaise: Secondary | ICD-10-CM

## 2014-07-20 DIAGNOSIS — E785 Hyperlipidemia, unspecified: Secondary | ICD-10-CM

## 2014-07-20 DIAGNOSIS — R5383 Other fatigue: Secondary | ICD-10-CM

## 2014-07-20 LAB — CBC WITH DIFFERENTIAL/PLATELET
Basophils Absolute: 0 10*3/uL (ref 0.0–0.1)
Basophils Relative: 0.5 % (ref 0.0–3.0)
Eosinophils Absolute: 0.1 10*3/uL (ref 0.0–0.7)
Eosinophils Relative: 2.1 % (ref 0.0–5.0)
HCT: 45.4 % (ref 39.0–52.0)
Hemoglobin: 15.7 g/dL (ref 13.0–17.0)
Lymphocytes Relative: 19.5 % (ref 12.0–46.0)
Lymphs Abs: 1.1 10*3/uL (ref 0.7–4.0)
MCHC: 34.6 g/dL (ref 30.0–36.0)
MCV: 92 fl (ref 78.0–100.0)
Monocytes Absolute: 0.7 10*3/uL (ref 0.1–1.0)
Monocytes Relative: 13 % — ABNORMAL HIGH (ref 3.0–12.0)
Neutro Abs: 3.5 10*3/uL (ref 1.4–7.7)
Neutrophils Relative %: 64.9 % (ref 43.0–77.0)
Platelets: 221 10*3/uL (ref 150.0–400.0)
RBC: 4.93 Mil/uL (ref 4.22–5.81)
RDW: 12.8 % (ref 11.5–15.5)
WBC: 5.5 10*3/uL (ref 4.0–10.5)

## 2014-07-20 LAB — COMPREHENSIVE METABOLIC PANEL
ALT: 27 U/L (ref 0–53)
AST: 26 U/L (ref 0–37)
Albumin: 4 g/dL (ref 3.5–5.2)
Alkaline Phosphatase: 73 U/L (ref 39–117)
BUN: 20 mg/dL (ref 6–23)
CO2: 29 mEq/L (ref 19–32)
Calcium: 9.2 mg/dL (ref 8.4–10.5)
Chloride: 104 mEq/L (ref 96–112)
Creatinine, Ser: 0.8 mg/dL (ref 0.4–1.5)
GFR: 104.41 mL/min (ref >60.00)
Glucose, Bld: 92 mg/dL (ref 70–99)
Potassium: 4.4 mEq/L (ref 3.5–5.1)
Sodium: 139 mEq/L (ref 135–145)
Total Bilirubin: 0.6 mg/dL (ref 0.2–1.2)
Total Protein: 6.5 g/dL (ref 6.0–8.3)

## 2014-07-20 LAB — LIPID PANEL
Cholesterol: 142 mg/dL (ref 0–200)
HDL: 49.1 mg/dL
LDL Cholesterol: 81 mg/dL (ref 0–99)
NonHDL: 92.9
Total CHOL/HDL Ratio: 3
Triglycerides: 62 mg/dL (ref 0.0–149.0)
VLDL: 12.4 mg/dL (ref 0.0–40.0)

## 2014-07-20 LAB — TSH: TSH: 3.36 u[IU]/mL (ref 0.35–4.50)

## 2014-07-21 ENCOUNTER — Encounter: Payer: Self-pay | Admitting: Internal Medicine

## 2014-07-31 ENCOUNTER — Encounter: Payer: Self-pay | Admitting: Internal Medicine

## 2014-08-02 NOTE — Telephone Encounter (Signed)
Pt called in to make an appt for cortisone shot but dont see no open spots till end of September. Please advise

## 2014-08-04 ENCOUNTER — Telehealth: Payer: Self-pay | Admitting: Internal Medicine

## 2014-08-04 NOTE — Telephone Encounter (Signed)
9:00 am this Friday

## 2014-08-04 NOTE — Telephone Encounter (Signed)
Pt called in to make an appt for cortisone shot but dont see no open spots till end of September. Please advise

## 2014-08-04 NOTE — Telephone Encounter (Signed)
Earliest I can find is 08/16/14 @ 10 am. For cortisone shot ?

## 2014-08-06 ENCOUNTER — Encounter: Payer: Self-pay | Admitting: Internal Medicine

## 2014-08-06 ENCOUNTER — Ambulatory Visit (INDEPENDENT_AMBULATORY_CARE_PROVIDER_SITE_OTHER): Payer: BC Managed Care – PPO | Admitting: Internal Medicine

## 2014-08-06 VITALS — BP 110/72 | HR 80 | Temp 98.7°F | Resp 14 | Wt 172.5 lb

## 2014-08-06 DIAGNOSIS — M7551 Bursitis of right shoulder: Secondary | ICD-10-CM

## 2014-08-06 DIAGNOSIS — M25511 Pain in right shoulder: Secondary | ICD-10-CM

## 2014-08-06 DIAGNOSIS — IMO0002 Reserved for concepts with insufficient information to code with codable children: Secondary | ICD-10-CM

## 2014-08-06 DIAGNOSIS — M25519 Pain in unspecified shoulder: Secondary | ICD-10-CM

## 2014-08-06 DIAGNOSIS — D126 Benign neoplasm of colon, unspecified: Secondary | ICD-10-CM

## 2014-08-06 DIAGNOSIS — M75100 Unspecified rotator cuff tear or rupture of unspecified shoulder, not specified as traumatic: Secondary | ICD-10-CM

## 2014-08-06 MED ORDER — PHENTERMINE HCL 37.5 MG PO TABS: 37.5000 mg | ORAL_TABLET | Freq: Every day | ORAL | Status: DC

## 2014-08-06 MED ORDER — LIDOCAINE HCL 1 % IJ SOLN: 4.0000 mL | Freq: Once | INTRAMUSCULAR | Status: DC

## 2014-08-06 MED ORDER — TRIAMCINOLONE ACETONIDE 40 MG/ML IJ SUSP: 40.0000 mg | Freq: Once | INTRAMUSCULAR | Status: DC

## 2014-08-06 NOTE — Progress Notes (Signed)
[name], male   DOB: 21-Apr-1951, 63 y.o.   MRN: [id number]   Patient Active Problem List   Diagnosis Date Noted  . Adenomatous polyp of colon 06/20/2014  . Family history of cerebral aneurysm 10/27/2013  . Headache behind the eyes 10/27/2013  . Subacromial or subdeltoid bursitis 12/04/2012  . Routine general medical examination at a health care facility 10/25/2012  . Shoulder pain, right 10/25/2012  . Screening for prostate cancer 08/09/2011  . Screening for colon cancer 08/09/2011  . Screening for lipoid disorders 08/09/2011    Subjective:  CC:   Chief Complaint  Patient presents with  . Injections    HPI:   Edward Tran is a 63 y.o. male who presents for   History reviewed. No pertinent past medical history.  History reviewed. No pertinent past surgical history.  . lidocaine  4 mL Infiltration Once  . lidocaine  5 mL Infiltration Once  . methylPREDNISolone acetate  40 mg Intra-articular Once  . triamcinolone acetonide  40 mg Intra-articular Once     The following portions of the patient's history were reviewed and updated as appropriate: Allergies, current medications, and problem list.    Review of Systems:   Patient denies headache, fevers, malaise, unintentional weight loss, skin rash, eye pain, sinus congestion and sinus pain, sore throat, dysphagia,  hemoptysis , cough, dyspnea, wheezing, chest pain, palpitations, orthopnea, edema, abdominal pain, nausea, melena, diarrhea, constipation, flank pain, dysuria, hematuria, urinary  Frequency, nocturia, numbness, tingling, seizures,  Focal weakness, Loss of consciousness,  Tremor, insomnia, depression, anxiety, and suicidal ideation.     History   Social History  . Marital Status: Married    Spouse Name: N/A    Number of Children: N/A  . Years of Education: N/A   Occupational History  . Not on file.   Social History Main Topics  . Smoking status: Never Smoker   . Smokeless tobacco:  Never Used  . Alcohol Use: 2.4 oz/week    4 Cans of beer per week     Comment: moderate  . Drug Use: No  . Sexual Activity: Not on file   Other Topics Concern  . Not on file   Social History Narrative  . No narrative on file    Objective:  Filed Vitals:   08/06/14 0942  BP: 110/72  Pulse: 80  Temp: 98.7 F (37.1 C)  Resp: 14     General appearance: alert, cooperative and appears stated age MSK:  Right shoulder without erythema or deformity.   Assessment and Plan:  Adenomatous polyp of colon Found on colonoscopy in 2010.  Repeat July 2015 found two hyperplastic polyps.  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  .Steroid injection requested .  After informed consent was obtained, right shoulder was cleaned with betadine and isopropyl alcohol.  Topical anesthetic was applied and subacromial bursa was injected under sterile conditions with 40 mg depo medrol and 4 ml xylocaine .  No immediate complications were noted,  Patient was instructed to avoid raising arm above shoulder for the next two weeks and to ice shoulder for 15 minutes every 4 hours as needed.  Oral NSAIDs and tylenol can be resumed.    Updated Medication List Outpatient Encounter Prescriptions as of 08/06/2014  Medication Sig  . loratadine (CLARITIN) 10 MG tablet TAKE 1 TABLET BY MOUTH EVERY DAY FOR ALLERGIES  . phentermine (ADIPEX-P) 37.5 MG tablet Take 1  tablet (37.5 mg total) by mouth daily before breakfast.  . simvastatin (ZOCOR) 20 MG tablet Take 1 tablet (20 mg total) by mouth at bedtime.  . zoster vaccine live, PF, (ZOSTAVAX) 74081 UNT/0.65ML injection Inject 19,400 Units into the skin once.  . zoster vaccine live, PF, (ZOSTAVAX) 44818 UNT/0.65ML injection Inject 19,400 Units into the skin once.  . [DISCONTINUED] phentermine (ADIPEX-P) 37.5 MG tablet Take 1 tablet (37.5 mg total) by mouth daily before breakfast.     No orders of the  defined types were placed in this encounter.    No Follow-up on file.

## 2014-08-06 NOTE — Progress Notes (Signed)
Pre visit review using our clinic review tool, if applicable. No additional management support is needed unless otherwise documented below in the visit note. 

## 2014-08-06 NOTE — Patient Instructions (Signed)
You received a steroid injection to your subacromial bursa today  Please avoid elevating the arm above the shoulder for two weeks to ensure resolution of your bursitis  If it resturns we should evaluate the shoulder with MRI and orthopedic referral

## 2014-08-08 ENCOUNTER — Encounter: Payer: Self-pay | Admitting: Internal Medicine

## 2014-08-08 NOTE — Assessment & Plan Note (Addendum)
Found on colonoscopy in 2010.  Repeat July 2015 found two hyperplastic polyps.  Repeat in 5 yrs [facility]

## 2014-08-08 NOTE — Assessment & Plan Note (Signed)
Recurrent, No response to NSAIDs.  Steroid injection requested and given today 40 mg depo medrol 4 ml xylocaine .  No complications

## 2014-11-08 ENCOUNTER — Other Ambulatory Visit: Payer: Self-pay | Admitting: *Deleted

## 2014-11-08 MED ORDER — SIMVASTATIN 20 MG PO TABS: 20.0000 mg | ORAL_TABLET | Freq: Every day | ORAL | Status: DC

## 2014-11-23 ENCOUNTER — Other Ambulatory Visit: Payer: Self-pay | Admitting: Internal Medicine

## 2014-12-03 HISTORY — PX: COLONOSCOPY: SHX174

## 2014-12-21 ENCOUNTER — Other Ambulatory Visit (INDEPENDENT_AMBULATORY_CARE_PROVIDER_SITE_OTHER): Payer: BLUE CROSS/BLUE SHIELD

## 2014-12-21 ENCOUNTER — Other Ambulatory Visit: Payer: Self-pay | Admitting: Internal Medicine

## 2014-12-21 DIAGNOSIS — Z79899 Other long term (current) drug therapy: Secondary | ICD-10-CM

## 2014-12-21 DIAGNOSIS — Z8249 Family history of ischemic heart disease and other diseases of the circulatory system: Secondary | ICD-10-CM

## 2014-12-21 DIAGNOSIS — R519 Headache, unspecified: Secondary | ICD-10-CM

## 2014-12-21 DIAGNOSIS — R51 Headache: Principal | ICD-10-CM

## 2014-12-21 DIAGNOSIS — Z1159 Encounter for screening for other viral diseases: Secondary | ICD-10-CM

## 2014-12-21 DIAGNOSIS — E785 Hyperlipidemia, unspecified: Secondary | ICD-10-CM

## 2014-12-21 LAB — LIPID PANEL
Cholesterol: 152 mg/dL (ref 0–200)
HDL: 51.1 mg/dL (ref >39.00)
LDL Cholesterol: 77 mg/dL (ref 0–99)
NonHDL: 100.9
Total CHOL/HDL Ratio: 3
Triglycerides: 118 mg/dL (ref 0.0–149.0)
VLDL: 23.6 mg/dL (ref 0.0–40.0)

## 2014-12-21 NOTE — Addendum Note (Signed)
Addended by: Johnsie Cancel on: 12/21/2014 01:28 PM   Modules accepted: Orders

## 2014-12-21 NOTE — Addendum Note (Signed)
Addended by: Johnsie Cancel on: 12/21/2014 08:33 AM   Modules accepted: Orders

## 2014-12-22 LAB — COMPLETE METABOLIC PANEL WITH GFR

## 2014-12-22 LAB — HEPATITIS C ANTIBODY: HCV Ab: NEGATIVE

## 2014-12-27 ENCOUNTER — Ambulatory Visit (INDEPENDENT_AMBULATORY_CARE_PROVIDER_SITE_OTHER): Payer: BLUE CROSS/BLUE SHIELD | Admitting: Internal Medicine

## 2014-12-27 ENCOUNTER — Encounter: Payer: Self-pay | Admitting: Internal Medicine

## 2014-12-27 VITALS — BP 114/78 | HR 59 | Temp 98.2°F | Resp 16 | Ht 68.0 in | Wt 172.5 lb

## 2014-12-27 DIAGNOSIS — M7551 Bursitis of right shoulder: Secondary | ICD-10-CM

## 2014-12-27 DIAGNOSIS — Z0000 Encounter for general adult medical examination without abnormal findings: Secondary | ICD-10-CM

## 2014-12-27 DIAGNOSIS — E663 Overweight: Secondary | ICD-10-CM

## 2014-12-27 DIAGNOSIS — E785 Hyperlipidemia, unspecified: Secondary | ICD-10-CM

## 2014-12-27 DIAGNOSIS — Z125 Encounter for screening for malignant neoplasm of prostate: Secondary | ICD-10-CM

## 2014-12-27 DIAGNOSIS — Z79899 Other long term (current) drug therapy: Secondary | ICD-10-CM

## 2014-12-27 MED ORDER — TRAMADOL HCL 50 MG PO TABS: 50.0000 mg | ORAL_TABLET | Freq: Three times a day (TID) | ORAL | Status: DC | PRN

## 2014-12-27 MED ORDER — PREDNISONE (PAK) 10 MG PO TABS: ORAL_TABLET | ORAL | Status: DC

## 2014-12-27 NOTE — Patient Instructions (Signed)
Let's try a six day prednisone taper for your shoulder pain before we resort to your third injection   Health Maintenance A healthy lifestyle and preventative care can promote health and wellness.  Maintain regular health, dental, and eye exams.  Eat a healthy diet. Foods like vegetables, fruits, whole grains, low-fat dairy products, and lean protein foods contain the nutrients you need and are low in calories. Decrease your intake of foods high in solid fats, added sugars, and salt. Get information about a proper diet from your health care provider, if necessary.  Regular physical exercise is one of the most important things you can do for your health. Most adults should get at least 150 minutes of moderate-intensity exercise (any activity that increases your heart rate and causes you to sweat) each week. In addition, most adults need muscle-strengthening exercises on 2 or more days a week.   Maintain a healthy weight. The body mass index (BMI) is a screening tool to identify possible weight problems. It provides an estimate of body fat based on height and weight. Your health care provider can find your BMI and can help you achieve or maintain a healthy weight. For males 20 years and older:  A BMI below 18.5 is considered underweight.  A BMI of 18.5 to 24.9 is normal.  A BMI of 25 to 29.9 is considered overweight.  A BMI of 30 and above is considered obese.  Maintain normal blood lipids and cholesterol by exercising and minimizing your intake of saturated fat. Eat a balanced diet with plenty of fruits and vegetables. Blood tests for lipids and cholesterol should begin at age 28 and be repeated every 5 years. If your lipid or cholesterol levels are high, you are over age 27, or you are at high risk for heart disease, you may need your cholesterol levels checked more frequently.Ongoing high lipid and cholesterol levels should be treated with medicines if diet and exercise are not working.  If  you smoke, find out from your health care provider how to quit. If you do not use tobacco, do not start.  Lung cancer screening is recommended for adults aged 12-80 years who are at high risk for developing lung cancer because of a history of smoking. A yearly low-dose CT scan of the lungs is recommended for people who have at least a 30-pack-year history of smoking and are current smokers or have quit within the past 15 years. A pack year of smoking is smoking an average of 1 pack of cigarettes a day for 1 year (for example, a 30-pack-year history of smoking could mean smoking 1 pack a day for 30 years or 2 packs a day for 15 years). Yearly screening should continue until the smoker has stopped smoking for at least 15 years. Yearly screening should be stopped for people who develop a health problem that would prevent them from having lung cancer treatment.  If you choose to drink alcohol, do not have more than 2 drinks per day. One drink is considered to be 12 oz (360 mL) of beer, 5 oz (150 mL) of wine, or 1.5 oz (45 mL) of liquor.  Avoid the use of street drugs. Do not share needles with anyone. Ask for help if you need support or instructions about stopping the use of drugs.  High blood pressure causes heart disease and increases the risk of stroke. Blood pressure should be checked at least every 1-2 years. Ongoing high blood pressure should be treated with medicines if  weight loss and exercise are not effective.  If you are 19-63 years old, ask your health care provider if you should take aspirin to prevent heart disease.  Diabetes screening involves taking a blood sample to check your fasting blood sugar level. This should be done once every 3 years after age 43 if you are at a normal weight and without risk factors for diabetes. Testing should be considered at a younger age or be carried out more frequently if you are overweight and have at least 1 risk factor for diabetes.  Colorectal cancer can  be detected and often prevented. Most routine colorectal cancer screening begins at the age of 58 and continues through age 70. However, your health care provider may recommend screening at an earlier age if you have risk factors for colon cancer. On a yearly basis, your health care provider may provide home test kits to check for hidden blood in the stool. A small camera at the end of a tube may be used to directly examine the colon (sigmoidoscopy or colonoscopy) to detect the earliest forms of colorectal cancer. Talk to your health care provider about this at age 41 when routine screening begins. A direct exam of the colon should be repeated every 5-10 years through age 29, unless early forms of precancerous polyps or small growths are found.  People who are at an increased risk for hepatitis B should be screened for this virus. You are considered at high risk for hepatitis B if:  You were born in a country where hepatitis B occurs often. Talk with your health care provider about which countries are considered high risk.  Your parents were born in a high-risk country and you have not received a shot to protect against hepatitis B (hepatitis B vaccine).  You have HIV or AIDS.  You use needles to inject street drugs.  You live with, or have sex with, someone who has hepatitis B.  You are a man who has sex with other men (MSM).  You get hemodialysis treatment.  You take certain medicines for conditions like cancer, organ transplantation, and autoimmune conditions.  Hepatitis C blood testing is recommended for all people born from [birth year] through 1963 and any individual with known risk factors for hepatitis C.  Healthy men should no longer receive prostate-specific antigen (PSA) blood tests as part of routine cancer screening. Talk to your health care provider about prostate cancer screening.  Testicular cancer screening is not recommended for adolescents or adult males who have no symptoms.  Screening includes self-exam, a health care provider exam, and other screening tests. Consult with your health care provider about any symptoms you have or any concerns you have about testicular cancer.  Practice safe sex. Use condoms and avoid high-risk sexual practices to reduce the spread of sexually transmitted infections (STIs).  You should be screened for STIs, including gonorrhea and chlamydia if:  You are sexually active and are younger than 24 years.  You are older than 24 years, and your health care provider tells you that you are at risk for this type of infection.  Your sexual activity has changed since you were last screened, and you are at an increased risk for chlamydia or gonorrhea. Ask your health care provider if you are at risk.  If you are at risk of being infected with HIV, it is recommended that you take a prescription medicine daily to prevent HIV infection. This is called pre-exposure prophylaxis (PrEP). You are considered at risk  if:  You are a man who has sex with other men (MSM).  You are a heterosexual man who is sexually active with multiple partners.  You take drugs by injection.  You are sexually active with a partner who has HIV.  Talk with your health care provider about whether you are at high risk of being infected with HIV. If you choose to begin PrEP, you should first be tested for HIV. You should then be tested every 3 months for as long as you are taking PrEP.  Use sunscreen. Apply sunscreen liberally and repeatedly throughout the day. You should seek shade when your shadow is shorter than you. Protect yourself by wearing long sleeves, pants, a wide-brimmed hat, and sunglasses year round whenever you are outdoors.  Tell your health care provider of new moles or changes in moles, especially if there is a change in shape or color. Also, tell your health care provider if a mole is larger than the size of a pencil eraser.  A one-time screening for  abdominal aortic aneurysm (AAA) and surgical repair of large AAAs by ultrasound is recommended for men aged 59-75 years who are current or former smokers.  Stay current with your vaccines (immunizations). Document Released: 05/17/2008 Document Revised: 11/24/2013 Document Reviewed: 04/16/2011 [facility] Patient Information 2015 [place], [place]. This information is not intended to replace advice given to you by your health care provider. Make sure you discuss any questions you have with your health care provider.

## 2014-12-27 NOTE — Progress Notes (Signed)
[name], male   DOB: December 08, 1950, 64 y.o.   MRN: [id number]   The patient is here for his annual male physical examination and management of other chronic and acute problems.      1) He had complete relief of right shoulder pain for 3 months after his last corticosteroid  subacromail injection in September,  But the pain has returned with his workouts and he wants another injection .  Last ortho evaluation with injection was by [facility] in January 2014 . Wants to avoid additional costs right   2) Weight gain.  Wants to resume phentermine for help in losing several pounds he has gained over the last month or so despite following a careful diet and exercising regularly.         The risk factors are reflected in the social history.  The roster of all physicians providing medical care to patient - is listed in the Snapshot section of the chart.  Activities of daily living:  The patient is 100% independent in all ADLs: dressing, toileting, feeding as well as independent mobility  Home safety : The patient has smoke detectors in the home. He wears seatbelts.  There are no firearms at home. There is no violence in the home.   There is no risks for hepatitis, STDs or HIV. There is no   history of blood transfusion. There is no travel history to infectious disease endemic areas of the world.  The patient has seen their dentist in the last six month and  their eye doctor in the last year.  They do not  have excessive sun exposure. They have seen a dermatoloigist in the last year. Discussed the need for sun protection: hats, long sleeves and use of sunscreen if there is significant sun exposure.   Diet: the importance of a healthy diet is discussed. They do have a healthy diet.  The benefits of regular aerobic exercise were discussed. He exercises a minimum of 30 minutes  5 days per week. Depression screen: there are no signs or vegative symptoms of depression- irritability,  change in appetite, anhedonia, sadness/tearfullness.  The following portions of the patient's history were reviewed and updated as appropriate: allergies, current medications, past family history, past medical history,  past surgical history, past social history  and problem list.  Visual acuity was not assessed per patient preference since he has regular follow up with his ophthalmologist. Hearing and body mass index were assessed and reviewed.   During the course of the visit the patient was educated and counseled about appropriate screening and preventive services including :  nutrition counseling, colorectal cancer screening, and recommended immunizations.     Review of Systems:  Patient denies headache, fevers, malaise, unintentional weight loss, skin rash, eye pain, sinus congestion and sinus pain, sore throat, dysphagia,  hemoptysis , cough, dyspnea, wheezing, chest pain, palpitations, orthopnea, edema, abdominal pain, nausea, melena, diarrhea, constipation, flank pain, dysuria, hematuria, urinary  Frequency, nocturia, numbness, tingling, seizures,  Focal weakness, Loss of consciousness,  Tremor, insomnia, depression, anxiety, and suicidal ideation.    Objective: BP 114/78 mmHg  Pulse 59  Temp(Src) 98.2 F (36.8 C) (Oral)  Resp 16  Ht 5' 8"  (1.727 m)  Wt 172 lb 8 oz (78.245 kg)  BMI 26.23 kg/m2  SpO2 98%  General Appearance:    Alert, cooperative, no distress, appears stated age  Head:    Normocephalic, without obvious abnormality, atraumatic  Eyes:    PERRL, conjunctiva/corneas clear,  EOM's intact, fundi    benign, both eyes       Ears:    Normal TM's and external ear canals, both ears  Nose:   Nares normal, septum midline, mucosa normal, no drainage   or sinus tenderness  Throat:   Lips, mucosa, and tongue normal; teeth and gums normal  Neck:   Supple, symmetrical, trachea midline, no adenopathy;       thyroid:  No enlargement/tenderness/nodules; no carotid   bruit or JVD   Back:     Symmetric, no curvature, ROM normal, no CVA tenderness  Lungs:     Clear to auscultation bilaterally, respirations unlabored  Chest wall:    No tenderness or deformity  Heart:    Regular rate and rhythm, S1 and S2 normal, no murmur, rub   or gallop  Abdomen:     Soft, non-tender, bowel sounds active all four quadrants,    no masses, no organomegaly  Genitalia:    Normal male without lesion, discharge or tenderness  Rectal:    Normal tone, normal prostate, no masses or tenderness;   guaiac negative stool  Extremities:   Extremities normal, atraumatic, no cyanosis or edema  Pulses:   2+ and symmetric all extremities  Skin:   Skin color, texture, turgor normal, no rashes or lesions  Lymph nodes:   Cervical, supraclavicular, and axillary nodes normal  Neurologic:   CNII-XII intact. Normal strength, sensation and reflexes      throughout     Assessment and Plan:  Problem List Items Addressed This Visit    Hyperlipidemia    LDL and triglycerides are at goal on current medications. He has no side effects and liver enzymes are normal. Will resume simvastatin at 20 mg daily .  Lab Results  Component Value Date   CHOL 152 12/21/2014   HDL 51.10 12/21/2014   LDLCALC 77 12/21/2014   LDLDIRECT 146.7 11/04/2013   TRIG 118.0 12/21/2014   CHOLHDL 3 12/21/2014   Lab Results  Component Value Date   ALT 20 12/27/2014   AST 21 12/27/2014   ALKPHOS 69 12/27/2014   BILITOT 0.3 12/27/2014         Relevant Medications   simvastatin (ZOCOR) tablet   Overweight    Request for phentermine refill denied and reasons for denial discussed with patient.  The risks of addiction outweigh the benefits      Routine general medical examination at a health care facility    Annual male exam was done including testicular and prostate exam. PSA is normal .  Annual wellness  exam was done as well as a comprehensive physical exam and management of acute and chronic conditions .  During the course  of the visit the patient was educated and counseled about appropriate screening and preventive services including :  diabetes screening, lipid analysis with projected  10 year  risk for CAD , nutrition counseling, colorectal cancer screening, and recommended immunizations.  Printed recommendations for health maintenance screenings was given.    Lab Results  Component Value Date   PSA 3.14 12/27/2014   PSA 2.24 11/04/2013   PSA 3.66 10/27/2012         Screening for prostate cancer   Subacromial or subdeltoid bursitis    I am reluctant to inject shoulder again without more information about the nature of the injury.  Records from [facility] requested.  Prednisone taper given..  Advised to suspend advil/aleve for two weeks.       Other Visit  Diagnoses    Long-term use of high-risk medication    -  Primary    Relevant Orders    Comp Met (CMET) (Completed)    Prostate cancer screening        Relevant Orders    PSA (Completed)

## 2014-12-27 NOTE — Progress Notes (Signed)
Pre-visit discussion using our clinic review tool. No additional management support is needed unless otherwise documented below in the visit note.  

## 2014-12-28 LAB — COMPREHENSIVE METABOLIC PANEL
ALT: 20 U/L (ref 0–53)
AST: 21 U/L (ref 0–37)
Albumin: 4.1 g/dL (ref 3.5–5.2)
Alkaline Phosphatase: 69 U/L (ref 39–117)
BUN: 17 mg/dL (ref 6–23)
CO2: 26 mEq/L (ref 19–32)
Calcium: 9.6 mg/dL (ref 8.4–10.5)
Chloride: 104 mEq/L (ref 96–112)
Creatinine, Ser: 0.81 mg/dL (ref 0.40–1.50)
GFR: 102.78 mL/min (ref >60.00)
Glucose, Bld: 89 mg/dL (ref 70–99)
Potassium: 4.3 mEq/L (ref 3.5–5.1)
Sodium: 139 mEq/L (ref 135–145)
Total Bilirubin: 0.3 mg/dL (ref 0.2–1.2)
Total Protein: 6.4 g/dL (ref 6.0–8.3)

## 2014-12-28 LAB — PSA: PSA: 3.14 ng/mL (ref 0.10–4.00)

## 2014-12-29 ENCOUNTER — Encounter: Payer: Self-pay | Admitting: Internal Medicine

## 2014-12-29 DIAGNOSIS — E663 Overweight: Secondary | ICD-10-CM | POA: Insufficient documentation

## 2014-12-29 MED ORDER — SIMVASTATIN 20 MG PO TABS: 20.0000 mg | ORAL_TABLET | Freq: Every day | ORAL | Status: DC

## 2014-12-29 NOTE — Assessment & Plan Note (Addendum)
LDL and triglycerides are at goal on current medications. He has no side effects and liver enzymes are normal. Will resume simvastatin at 20 mg daily .  Lab Results  Component Value Date   CHOL 152 12/21/2014   HDL 51.10 12/21/2014   LDLCALC 77 12/21/2014   LDLDIRECT 146.7 11/04/2013   TRIG 118.0 12/21/2014   CHOLHDL 3 12/21/2014   Lab Results  Component Value Date   ALT 20 12/27/2014   AST 21 12/27/2014   ALKPHOS 69 12/27/2014   BILITOT 0.3 12/27/2014

## 2014-12-29 NOTE — Assessment & Plan Note (Signed)
Annual male exam was done including testicular and prostate exam. PSA is normal .  Annual wellness  exam was done as well as a comprehensive physical exam and management of acute and chronic conditions .  During the course of the visit the patient was educated and counseled about appropriate screening and preventive services including :  diabetes screening, lipid analysis with projected  10 year  risk for CAD , nutrition counseling, colorectal cancer screening, and recommended immunizations.  Printed recommendations for health maintenance screenings was given.    Lab Results  Component Value Date   PSA 3.14 12/27/2014   PSA 2.24 11/04/2013   PSA 3.66 10/27/2012

## 2014-12-29 NOTE — Assessment & Plan Note (Signed)
I am reluctant to inject shoulder again without more information about the nature of the injury.  Records from [facility] requested.  Prednisone taper given..  Advised to suspend advil/aleve for two weeks.

## 2014-12-29 NOTE — Assessment & Plan Note (Signed)
Request for phentermine refill denied and reasons for denial discussed with patient.  The risks of addiction outweigh the benefits

## 2015-02-08 ENCOUNTER — Telehealth: Payer: Self-pay | Admitting: Internal Medicine

## 2015-02-08 NOTE — Telephone Encounter (Signed)
Yes.  DO NOT SCHEDULE FRO Friday MORNING, I AM ATTENDING A FUNERAL

## 2015-02-08 NOTE — Telephone Encounter (Signed)
Left message to return call to office.

## 2015-02-08 NOTE — Telephone Encounter (Signed)
Shoulder injection ok to schedule?

## 2015-02-08 NOTE — Telephone Encounter (Signed)
No,  Refill denied,  bc phentermine is reserved for patients with BMI > 28 ,  Since it is a controlled substance , it should not be used casually

## 2015-02-08 NOTE — Telephone Encounter (Signed)
Patient called C/O  Shoulder pain stated was prescribed a prednisone taper and was advised to call back if this did not improve right  shoulder for Cortisone injection, patient would like to know if he could get the phentermine refilled due to weight gain from the prednisone Please advise.

## 2015-02-09 NOTE — Telephone Encounter (Signed)
Patient notified and appointment set.

## 2015-02-17 ENCOUNTER — Ambulatory Visit: Payer: BLUE CROSS/BLUE SHIELD | Admitting: Internal Medicine

## 2015-02-23 ENCOUNTER — Ambulatory Visit (INDEPENDENT_AMBULATORY_CARE_PROVIDER_SITE_OTHER): Payer: BLUE CROSS/BLUE SHIELD | Admitting: Internal Medicine

## 2015-02-23 ENCOUNTER — Encounter: Payer: Self-pay | Admitting: Internal Medicine

## 2015-02-23 VITALS — BP 114/60 | HR 58 | Temp 98.3°F | Resp 14 | Ht 68.0 in | Wt 176.0 lb

## 2015-02-23 DIAGNOSIS — M7551 Bursitis of right shoulder: Secondary | ICD-10-CM | POA: Diagnosis not present

## 2015-02-23 MED ORDER — TRIAMCINOLONE ACETONIDE 40 MG/ML IJ SUSP
20.0000 mg | Freq: Once | INTRAMUSCULAR | Status: DC
Start: 2015-02-23 — End: 2018-11-17

## 2015-02-23 MED ORDER — LIDOCAINE HCL 1 % IJ SOLN: 4.0000 mL | Freq: Once | INTRAMUSCULAR | Status: DC

## 2015-02-23 NOTE — Progress Notes (Signed)
[name], male   DOB: 1951-05-05, 64 y.o.   MRN: [id number]  Patient Active Problem List   Diagnosis Date Noted  . Overweight 12/29/2014  . Adenomatous polyp of colon 06/20/2014  . Family history of cerebral aneurysm 10/27/2013  . Headache behind the eyes 10/27/2013  . Subacromial or subdeltoid bursitis 12/04/2012  . Routine general medical examination at a health care facility 10/25/2012  . Shoulder pain, right 10/25/2012  . Screening for prostate cancer 08/09/2011  . Screening for colon cancer 08/09/2011  . Hyperlipidemia 08/09/2011    Subjective:  CC:   Chief Complaint  Patient presents with  . Shoulder Pain    Right shoulder.    HPI:   Edward Tran is a 64 y.o. male who presents for treatment of chronic Right shoulder pain,  secondary to subacromial bursitis. Presumed.  equesting steroid injection   No improvement after NSAIDs . Has not been lifting anything heavy or overusing arm.    No past medical history on file.  No past surgical history on file.  . lidocaine  4 mL Infiltration Once  . lidocaine  4 mL Other Once  . lidocaine  5 mL Infiltration Once  . methylPREDNISolone acetate  40 mg Intra-articular Once  . triamcinolone acetonide  20 mg Intramuscular Once  . triamcinolone acetonide  40 mg Intra-articular Once     The following portions of the patient's history were reviewed and updated as appropriate: Allergies, current medications, and problem list.    Review of Systems:   Patient denies headache, fevers, malaise, unintentional weight loss, skin rash, eye pain, sinus congestion and sinus pain, sore throat, dysphagia,  hemoptysis , cough, dyspnea, wheezing, chest pain, palpitations, orthopnea, edema, abdominal pain, nausea, melena, diarrhea, constipation, flank pain, dysuria, hematuria, urinary  Frequency, nocturia, numbness, tingling, seizures,  Focal weakness, Loss of consciousness,  Tremor, insomnia, depression, anxiety, and suicidal  ideation.     History   Social History  . Marital Status: Married    Spouse Name: N/A  . Number of Children: N/A  . Years of Education: N/A   Occupational History  . Not on file.   Social History Main Topics  . Smoking status: Never Smoker   . Smokeless tobacco: Never Used  . Alcohol Use: 2.4 oz/week    4 Cans of beer per week     Comment: moderate  . Drug Use: No  . Sexual Activity: Not on file   Other Topics Concern  . Not on file   Social History Narrative    Objective:  Filed Vitals:   02/23/15 1558  BP: 114/60  Pulse: 58  Temp: 98.3 F (36.8 C)  Resp: 14     General appearance: alert, cooperative and appears stated age Neck: no adenopathy, no carotid bruit, supple, symmetrical, trachea midline and thyroid not enlarged, symmetric, no tenderness/mass/nodules Back: symmetric, no curvature. ROM normal. No CVA tenderness. Lungs: clear to auscultation bilaterally Heart: regular rate and rhythm, S1, S2 normal, no murmur, click, rub or gallop Right shoulder: maximum tenderness at lateral subacromial space.  Assessment and Plan:  Subacromial or subdeltoid bursitis Patient requested a steroid injection due to persistent pain despite regular use of NSAIDs and activity modification.  Afer informed consent was obtained, Subacromial space was injected under sterile conditions with kenalog and  Xylocaine.  No immediate complications .     Updated Medication List Outpatient Encounter Prescriptions as of 02/23/2015  Medication Sig  . loratadine (CLARITIN) 10 MG tablet TAKE  1 TABLET BY MOUTH EVERY DAY FOR ALLERGIES  . Omega-3 Fatty Acids (FISH OIL PO) Take 1,400 mg by mouth daily.   . simvastatin (ZOCOR) 20 MG tablet Take 1 tablet (20 mg total) by mouth at bedtime.  . predniSONE (STERAPRED UNI-PAK) 10 MG tablet 6 tablets on Day 1 , then reduce by 1 tablet daily until gone (Patient not taking: Reported on 02/23/2015)  . traMADol (ULTRAM) 50 MG tablet Take 1 tablet (50 mg  total) by mouth every 8 (eight) hours as needed. (Patient not taking: Reported on 02/23/2015)     No orders of the defined types were placed in this encounter.    No Follow-up on file.

## 2015-02-23 NOTE — Patient Instructions (Signed)
Bursitis Bursitis is inflammation of a bursa. A bursa is a soft, fluid-filled sac. It cushions the soft tissue around a bone. Bursitis often occurs in the bursas near the shoulders, elbows, knees, pelvis, hips, heel, and Achilles tendon.  SYMPTOMS   Pain and tenderness in the affected area. Sometimes, pain radiates into surrounding areas. Specifically, pain with movement.  Limited range of motion of the affected joint.  Sometimes, painless swelling of the bursa.  Fever (when infected). CAUSES   Injury to a joint or bursa.  Overuse or strenuous exercise of a joint.  Gout (disease with inflamed joints).  Prolonged pressure on a joint containing bursas (resting on an elbow or kneeling).  Arthritis.  Acute or chronic infection.  Calcium deposits in shoulder tendons, with degeneration of the tendon. RISK INCREASES WITH:  Vigorous, repeated, or sudden increase in athletic training or activity level.  Failure to warm up properly.  Overstretching.  Improper exercise technique.  Playing sports on [place]f. PREVENTION  Avoid injuries or overuse of muscles.  Warm up and cool down properly. Do this before and after physical activity.  Maintain proper conditioning:  Joint flexibility.  Muscle strength and endurance.  Cardiovascular fitness.  Learn and use proper technique.  Wear protective equipment. PROGNOSIS  With proper treatment, symptoms often go away within 7 to 14 days.  RELATED COMPLICATIONS   Frequent recurrence of symptoms. This can result in a chronic, repetitive problem.  Joint stiffness.  Limited joint movement.  Infection of bursa.  Chronic inflammation or scarring of bursa. TREATMENT Treatment first involves protecting and resting the bursa and its joint. You may use ice or an elastic bandage to reduce inflammation. Anti-inflammatory medicines may help resolve the swelling. If symptoms persist despite treatment, a caregiver may withdraw fluid  from the bursa. They might also consider a corticosteroid injection. Sometimes, bursitis will persist in spite of nonsurgical treatment or will become infected. These cases may require removal (surgical excision) of the bursa.  MEDICATION   If pain medicine is needed, nonsteroidal anti-inflammatory medicines, such as aspirin and ibuprofen, or other minor pain relievers, such as acetaminophen, are often recommended.  Do not take pain medicine for 7 days before surgery.  Prescription pain relievers are usually only prescribed after surgery. Use only as directed and only as much as you need.  Ointments applied to the skin may be helpful.  Corticosteroid injections may be given. This is done to reduce inflammation in the bursa. HEAT AND COLD:  Cold treatment (icing) relieves pain and reduces inflammation. Cold treatment should be applied for 10 to 15 minutes every 2 to 3 hours for inflammation and pain, and immediately after any activity that aggravates your symptoms. Use ice packs or an ice massage.  Heat treatment may be used prior to performing the stretching and strengthening activities prescribed by your caregiver, physical therapist, or athletic trainer. Use a heat pack or a warm soak. SEEK MEDICAL CARE IF:   Symptoms get worse or do not improve in 2 weeks, despite treatment.  New, unexplained symptoms develop. (Drugs used in treatment may produce side effects.) Document Released: 11/19/2005 Document Revised: 04/05/2014 Document Reviewed: 03/03/2009 [facility] Patient Information 2015 [place], [place]. This information is not intended to replace advice given to you by your health care provider. Make sure you discuss any questions you have with your health care provider.

## 2015-02-26 NOTE — Assessment & Plan Note (Signed)
Patient requested a sterid injection due to persistent pain despite regular use of NSAIDs and activity modification.  Afer infomred consent was obtained, Subacromial space was injected under sterile conditions with kenalog and  Xylocaine.  No immediate complications .

## 2015-04-04 ENCOUNTER — Other Ambulatory Visit: Payer: Self-pay | Admitting: Internal Medicine

## 2015-06-19 ENCOUNTER — Other Ambulatory Visit: Payer: Self-pay | Admitting: Internal Medicine

## 2015-09-23 ENCOUNTER — Other Ambulatory Visit: Payer: Self-pay | Admitting: Internal Medicine

## 2015-12-26 ENCOUNTER — Other Ambulatory Visit: Payer: Self-pay | Admitting: Internal Medicine

## 2015-12-26 ENCOUNTER — Encounter: Payer: Self-pay | Admitting: Internal Medicine

## 2015-12-26 MED ORDER — SIMVASTATIN 20 MG PO TABS: 20.0000 mg | ORAL_TABLET | Freq: Every day | ORAL | Status: DC

## 2015-12-30 ENCOUNTER — Ambulatory Visit (INDEPENDENT_AMBULATORY_CARE_PROVIDER_SITE_OTHER): Payer: BLUE CROSS/BLUE SHIELD | Admitting: Internal Medicine

## 2015-12-30 ENCOUNTER — Encounter: Payer: Self-pay | Admitting: Internal Medicine

## 2015-12-30 VITALS — BP 120/82 | HR 70 | Temp 98.2°F | Resp 12 | Ht 68.0 in | Wt 183.0 lb

## 2015-12-30 DIAGNOSIS — E663 Overweight: Secondary | ICD-10-CM

## 2015-12-30 DIAGNOSIS — E559 Vitamin D deficiency, unspecified: Secondary | ICD-10-CM

## 2015-12-30 DIAGNOSIS — E785 Hyperlipidemia, unspecified: Secondary | ICD-10-CM | POA: Diagnosis not present

## 2015-12-30 DIAGNOSIS — Z114 Encounter for screening for human immunodeficiency virus [HIV]: Secondary | ICD-10-CM | POA: Diagnosis not present

## 2015-12-30 DIAGNOSIS — R5383 Other fatigue: Secondary | ICD-10-CM

## 2015-12-30 DIAGNOSIS — Z0000 Encounter for general adult medical examination without abnormal findings: Secondary | ICD-10-CM

## 2015-12-30 LAB — COMPREHENSIVE METABOLIC PANEL
ALT: 22 U/L (ref 0–53)
AST: 21 U/L (ref 0–37)
Albumin: 4.6 g/dL (ref 3.5–5.2)
Alkaline Phosphatase: 73 U/L (ref 39–117)
BUN: 20 mg/dL (ref 6–23)
CO2: 30 mEq/L (ref 19–32)
Calcium: 9.7 mg/dL (ref 8.4–10.5)
Chloride: 103 mEq/L (ref 96–112)
Creatinine, Ser: 1.06 mg/dL (ref 0.40–1.50)
GFR: 75.1 mL/min (ref >60.00)
Glucose, Bld: 99 mg/dL (ref 70–99)
Potassium: 4.5 mEq/L (ref 3.5–5.1)
Sodium: 138 mEq/L (ref 135–145)
Total Bilirubin: 0.4 mg/dL (ref 0.2–1.2)
Total Protein: 7.1 g/dL (ref 6.0–8.3)

## 2015-12-30 LAB — LIPID PANEL
Cholesterol: 150 mg/dL (ref 0–200)
HDL: 45.3 mg/dL (ref >39.00)
LDL Cholesterol: 71 mg/dL (ref 0–99)
NonHDL: 105.1
Total CHOL/HDL Ratio: 3
Triglycerides: 170 mg/dL — ABNORMAL HIGH (ref 0.0–149.0)
VLDL: 34 mg/dL (ref 0.0–40.0)

## 2015-12-30 LAB — VITAMIN D 25 HYDROXY (VIT D DEFICIENCY, FRACTURES): VITD: 22.06 ng/mL — ABNORMAL LOW (ref 30.00–100.00)

## 2015-12-30 LAB — TSH: TSH: 3.05 u[IU]/mL (ref 0.35–4.50)

## 2015-12-30 LAB — LDL CHOLESTEROL, DIRECT: Direct LDL: 79 mg/dL

## 2015-12-30 NOTE — Progress Notes (Signed)
[name], male    DOB: 1951-05-14  Age: 65 y.o. MRN: [id number]  The patient is here for annual  wellness examination and management of other chronic and acute problems.  Colonoscopy  2015 Hep c screen jan 2016 Weight gain of 7 lbs noted,     The risk factors are reflected in the social history.  The roster of all physicians providing medical care to patient - is listed in the Snapshot section of the chart.  Activities of daily living:  The patient is 100% independent in all ADLs: dressing, toileting, feeding as well as independent mobility  Home safety : The patient has smoke detectors in the home. They wear seatbelts.  There are no firearms at home. There is no violence in the home.   There is no risks for hepatitis, STDs or HIV. There is no   history of blood transfusion. They have no travel history to infectious disease endemic areas of the world.  The patient has seen their dentist in the last six month. They have seen their eye doctor in the last year. They admit to slight hearing difficulty with regard to whispered voices and some television programs.  They have deferred audiologic testing in the last year.  They do not  have excessive sun exposure. Discussed the need for sun protection: hats, long sleeves and use of sunscreen if there is significant sun exposure.   Diet: the importance of a healthy diet is discussed. They do have a healthy diet.  The benefits of regular aerobic exercise were discussed. He exercises at the gym doing cardiovascular activities  3 times per week ,  60 minutes.   Depression screen: there are no signs or vegative symptoms of depression- irritability, change in appetite, anhedonia, sadness/tearfullness.  Cognitive assessment: the patient manages all their financial and personal affairs and is actively engaged. They could relate day,date,year and events; recalled 2/3 objects at 3 minutes; performed clock-face test normally.  The following  portions of the patient's history were reviewed and updated as appropriate: allergies, current medications, past family history, past medical history,  past surgical history, past social history  and problem list.  Visual acuity was not assessed per patient preference since she has regular follow up with her ophthalmologist. Hearing and body mass index were assessed and reviewed.   During the course of the visit the patient was educated and counseled about appropriate screening and preventive services including : fall prevention , diabetes screening, nutrition counseling, colorectal cancer screening, and recommended immunizations.    CC: There were no encounter diagnoses.  History Edward Tran has no past medical history on file.   He has no past surgical history on file.   His family history includes COPD in his mother and sister; Cancer in his maternal grandmother and sister; Early death in his father; Stroke (age of onset: 76) in his mother; Stroke (age of onset: 25) in his maternal grandmother.He reports that he has never smoked. He has never used smokeless tobacco. He reports that he drinks about 2.4 oz of alcohol per week. He reports that he does not use illicit drugs.  Outpatient Prescriptions Prior to Visit  Medication Sig Dispense Refill  . loratadine (CLARITIN) 10 MG tablet TAKE 1 TABLET BY MOUTH EVERY DAY FOR ALLERGIES (OTC NOT COVERED) 30 tablet 5  . Omega-3 Fatty Acids (FISH OIL PO) Take 1,400 mg by mouth daily.     . simvastatin (ZOCOR) 20 MG tablet Take 1 tablet (20 mg total)  by mouth at bedtime. 30 tablet 0  . traMADol (ULTRAM) 50 MG tablet Take 1 tablet (50 mg total) by mouth every 8 (eight) hours as needed. (Patient not taking: Reported on 02/23/2015) 90 tablet 0  . predniSONE (STERAPRED UNI-PAK) 10 MG tablet 6 tablets on Day 1 , then reduce by 1 tablet daily until gone (Patient not taking: Reported on 02/23/2015) 21 tablet 0   Facility-Administered Medications Prior to Visit   Medication Dose Route Frequency Provider Last Rate Last Dose  . lidocaine (XYLOCAINE) 1 % (with pres) injection 4 mL  4 mL Infiltration Once Crecencio Mc, MD      . lidocaine (XYLOCAINE) 1 % (with pres) injection 4 mL  4 mL Other Once Crecencio Mc, MD      . lidocaine (XYLOCAINE) 2 % (with pres) injection 100 mg  5 mL Infiltration Once Crecencio Mc, MD      . methylPREDNISolone acetate (DEPO-MEDROL) injection 40 mg  40 mg Intra-articular Once Crecencio Mc, MD      . triamcinolone acetonide (KENALOG-40) injection 20 mg  20 mg Intramuscular Once Crecencio Mc, MD      . triamcinolone acetonide (KENALOG-40) injection 40 mg  40 mg Intra-articular Once Crecencio Mc, MD        Review of Systems   Patient denies headache, fevers, malaise, unintentional weight loss, skin rash, eye pain, sinus congestion and sinus pain, sore throat, dysphagia,  hemoptysis , cough, dyspnea, wheezing, chest pain, palpitations, orthopnea, edema, abdominal pain, nausea, melena, diarrhea, constipation, flank pain, dysuria, hematuria, urinary  Frequency, nocturia, numbness, tingling, seizures,  Focal weakness, Loss of consciousness,  Tremor, insomnia, depression, anxiety, and suicidal ideation.      Objective:  BP 120/82 mmHg  Pulse 70  Temp(Src) 98.2 F (36.8 C) (Oral)  Resp 12  Ht 5\' 8"  (1.727 m)  Wt 183 lb (83.008 kg)  BMI 27.83 kg/m2  SpO2 94%  Physical Exam   General appearance: alert, cooperative and appears stated age Ears: normal TM's and external ear canals both ears Throat: lips, mucosa, and tongue normal; teeth and gums normal Neck: no adenopathy, no carotid bruit, supple, symmetrical, trachea midline and thyroid not enlarged, symmetric, no tenderness/mass/nodules Back: symmetric, no curvature. ROM normal. No CVA tenderness. Lungs: clear to auscultation bilaterally Heart: regular rate and rhythm, S1, S2 normal, no murmur, click, rub or gallop Abdomen: soft, non-tender; bowel sounds normal;  no masses,  no organomegaly Pulses: 2+ and symmetric Skin: Skin color, texture, turgor normal. No rashes or lesions Lymph nodes: Cervical, supraclavicular, and axillary nodes normal.   Assessment & Plan:   Problem List Items Addressed This Visit    None      I have discontinued Mr. Golaszewski predniSONE. I am also having him maintain his Omega-3 Fatty Acids (FISH OIL PO), traMADol, loratadine, simvastatin, and Biotin. We will continue to administer methylPREDNISolone acetate, lidocaine, lidocaine, triamcinolone acetonide, triamcinolone acetonide, and lidocaine.  Meds ordered this encounter  Medications  . Biotin 5000 MCG CAPS    Sig: Take 1 capsule by mouth daily.    Medications Discontinued During This Encounter  Medication Reason  . predniSONE (STERAPRED UNI-PAK) 10 MG tablet Completed Course    Follow-up: No Follow-up on file.   Crecencio Mc, MD

## 2015-12-30 NOTE — Progress Notes (Signed)
Pre-visit discussion using our clinic review tool. No additional management support is needed unless otherwise documented below in the visit note.  

## 2015-12-30 NOTE — Patient Instructions (Signed)
It was good to see you!   Edward Tran now makes a frozen breakfast frittata that can be microwaved in 2 minutes and is very low carb.Edward Tran are similar to quiches without the crust)  Health Maintenance, Male A healthy lifestyle and preventative care can promote health and wellness.  Maintain regular health, dental, and eye exams.  Eat a healthy diet. Foods like vegetables, fruits, whole grains, low-fat dairy products, and lean protein foods contain the nutrients you need and are low in calories. Decrease your intake of foods high in solid fats, added sugars, and salt. Get information about a proper diet from your health care provider, if necessary.  Regular physical exercise is one of the most important things you can do for your health. Most adults should get at least 150 minutes of moderate-intensity exercise (any activity that increases your heart rate and causes you to sweat) each week. In addition, most adults need muscle-strengthening exercises on 2 or more days a week.   Maintain a healthy weight. The body mass index (BMI) is a screening tool to identify possible weight problems. It provides an estimate of body fat based on height and weight. Your health care provider can find your BMI and can help you achieve or maintain a healthy weight. For males 20 years and older:  A BMI below 18.5 is considered underweight.  A BMI of 18.5 to 24.9 is normal.  A BMI of 25 to 29.9 is considered overweight.  A BMI of 30 and above is considered obese.  Maintain normal blood lipids and cholesterol by exercising and minimizing your intake of saturated fat. Eat a balanced diet with plenty of fruits and vegetables. Blood tests for lipids and cholesterol should begin at age 13 and be repeated every 5 years. If your lipid or cholesterol levels are high, you are over age 75, or you are at high risk for heart disease, you may need your cholesterol levels checked more frequently.Ongoing high lipid and  cholesterol levels should be treated with medicines if diet and exercise are not working.  If you smoke, find out from your health care provider how to quit. If you do not use tobacco, do not start.  Lung cancer screening is recommended for adults aged 73-80 years who are at high risk for developing lung cancer because of a history of smoking. A yearly low-dose CT scan of the lungs is recommended for people who have at least a 30-pack-year history of smoking and are current smokers or have quit within the past 15 years. A pack year of smoking is smoking an average of 1 pack of cigarettes a day for 1 year (for example, a 30-pack-year history of smoking could mean smoking 1 pack a day for 30 years or 2 packs a day for 15 years). Yearly screening should continue until the smoker has stopped smoking for at least 15 years. Yearly screening should be stopped for people who develop a health problem that would prevent them from having lung cancer treatment.  If you choose to drink alcohol, do not have more than 2 drinks per day. One drink is considered to be 12 oz (360 mL) of beer, 5 oz (150 mL) of wine, or 1.5 oz (45 mL) of liquor.  Avoid the use of street drugs. Do not share needles with anyone. Ask for help if you need support or instructions about stopping the use of drugs.  High blood pressure causes heart disease and increases the risk of stroke. High blood  pressure is more likely to develop in:  People who have blood pressure in the end of the normal range (100-139/85-89 mm Hg).  People who are overweight or obese.  People who are African American.  If you are 52-59 years of age, have your blood pressure checked every 3-5 years. If you are 45 years of age or older, have your blood pressure checked every year. You should have your blood pressure measured twice--once when you are at a hospital or clinic, and once when you are not at a hospital or clinic. Record the average of the two measurements. To  check your blood pressure when you are not at a hospital or clinic, you can use:  An automated blood pressure machine at a pharmacy.  A home blood pressure monitor.  If you are 47-33 years old, ask your health care provider if you should take aspirin to prevent heart disease.  Diabetes screening involves taking a blood sample to check your fasting blood sugar level. This should be done once every 3 years after age 14 if you are at a normal weight and without risk factors for diabetes. Testing should be considered at a younger age or be carried out more frequently if you are overweight and have at least 1 risk factor for diabetes.  Colorectal cancer can be detected and often prevented. Most routine colorectal cancer screening begins at the age of 27 and continues through age 37. However, your health care provider may recommend screening at an earlier age if you have risk factors for colon cancer. On a yearly basis, your health care provider may provide home test kits to check for hidden blood in the stool. A small camera at the end of a tube may be used to directly examine the colon (sigmoidoscopy or colonoscopy) to detect the earliest forms of colorectal cancer. Talk to your health care provider about this at age 102 when routine screening begins. A direct exam of the colon should be repeated every 5-10 years through age 3, unless early forms of precancerous polyps or small growths are found.  People who are at an increased risk for hepatitis B should be screened for this virus. You are considered at high risk for hepatitis B if:  You were born in a country where hepatitis B occurs often. Talk with your health care provider about which countries are considered high risk.  Your parents were born in a high-risk country and you have not received a shot to protect against hepatitis B (hepatitis B vaccine).  You have HIV or AIDS.  You use needles to inject street drugs.  You live with, or have sex  with, someone who has hepatitis B.  You are a man who has sex with other men (MSM).  You get hemodialysis treatment.  You take certain medicines for conditions like cancer, organ transplantation, and autoimmune conditions.  Hepatitis C blood testing is recommended for all people born from [birth year] through 1963 and any individual with known risk factors for hepatitis C.  Healthy men should no longer receive prostate-specific antigen (PSA) blood tests as part of routine cancer screening. Talk to your health care provider about prostate cancer screening.  Testicular cancer screening is not recommended for adolescents or adult males who have no symptoms. Screening includes self-exam, a health care provider exam, and other screening tests. Consult with your health care provider about any symptoms you have or any concerns you have about testicular cancer.  Practice safe sex. Use condoms and avoid  high-risk sexual practices to reduce the spread of sexually transmitted infections (STIs).  You should be screened for STIs, including gonorrhea and chlamydia if:  You are sexually active and are younger than 24 years.  You are older than 24 years, and your health care provider tells you that you are at risk for this type of infection.  Your sexual activity has changed since you were last screened, and you are at an increased risk for chlamydia or gonorrhea. Ask your health care provider if you are at risk.  If you are at risk of being infected with HIV, it is recommended that you take a prescription medicine daily to prevent HIV infection. This is called pre-exposure prophylaxis (PrEP). You are considered at risk if:  You are a man who has sex with other men (MSM).  You are a heterosexual man who is sexually active with multiple partners.  You take drugs by injection.  You are sexually active with a partner who has HIV.  Talk with your health care provider about whether you are at high risk of being  infected with HIV. If you choose to begin PrEP, you should first be tested for HIV. You should then be tested every 3 months for as long as you are taking PrEP.  Use sunscreen. Apply sunscreen liberally and repeatedly throughout the day. You should seek shade when your shadow is shorter than you. Protect yourself by wearing long sleeves, pants, a wide-brimmed hat, and sunglasses year round whenever you are outdoors.  Tell your health care provider of new moles or changes in moles, especially if there is a change in shape or color. Also, tell your health care provider if a mole is larger than the size of a pencil eraser.  A one-time screening for abdominal aortic aneurysm (AAA) and surgical repair of large AAAs by ultrasound is recommended for men aged 78-75 years who are current or former smokers.  Stay current with your vaccines (immunizations).   This information is not intended to replace advice given to you by your health care provider. Make sure you discuss any questions you have with your health care provider.   Document Released: 05/17/2008 Document Revised: 12/10/2014 Document Reviewed: 04/16/2011 Elsevier Interactive Patient Education [employer].

## 2015-12-31 LAB — HIV ANTIBODY (ROUTINE TESTING W REFLEX): HIV 1&2 Ab, 4th Generation: NONREACTIVE

## 2016-01-01 ENCOUNTER — Encounter: Payer: Self-pay | Admitting: Internal Medicine

## 2016-01-01 ENCOUNTER — Other Ambulatory Visit: Payer: Self-pay | Admitting: Internal Medicine

## 2016-01-01 DIAGNOSIS — E559 Vitamin D deficiency, unspecified: Secondary | ICD-10-CM

## 2016-01-01 MED ORDER — ERGOCALCIFEROL 1.25 MG (50000 UT) PO CAPS: 50000.0000 [IU] | ORAL_CAPSULE | ORAL | Status: DC

## 2016-01-01 NOTE — Assessment & Plan Note (Signed)
LDL and triglycerides are at goal on current medications. He has no side effects and liver enzymes are normal. Will resume simvastatin at 20 mg daily .  Lab Results  Component Value Date   CHOL 150 12/30/2015   HDL 45.30 12/30/2015   LDLCALC 71 12/30/2015   LDLDIRECT 79.0 12/30/2015   TRIG 170.0* 12/30/2015   CHOLHDL 3 12/30/2015   Lab Results  Component Value Date   ALT 22 12/30/2015   AST 21 12/30/2015   ALKPHOS 73 12/30/2015   BILITOT 0.4 12/30/2015

## 2016-01-01 NOTE — Assessment & Plan Note (Signed)
I have addressed  BMI and recommended wt loss of 10% of body weigh over the next 6 months using a low glycemic index diet and regular exercise a minimum of 5 days per week.   

## 2016-01-01 NOTE — Assessment & Plan Note (Signed)

## 2016-01-18 NOTE — Telephone Encounter (Signed)
Mailed unread message to patient.  

## 2016-01-22 ENCOUNTER — Encounter: Payer: Self-pay | Admitting: Internal Medicine

## 2016-01-22 ENCOUNTER — Other Ambulatory Visit: Payer: Self-pay | Admitting: Internal Medicine

## 2016-01-26 ENCOUNTER — Encounter: Payer: Self-pay | Admitting: Internal Medicine

## 2016-01-26 ENCOUNTER — Other Ambulatory Visit: Payer: Self-pay

## 2016-01-26 MED ORDER — SIMVASTATIN 20 MG PO TABS: ORAL_TABLET | ORAL | Status: DC

## 2016-03-06 ENCOUNTER — Other Ambulatory Visit: Payer: Self-pay | Admitting: Internal Medicine

## 2016-05-18 ENCOUNTER — Other Ambulatory Visit: Payer: Self-pay | Admitting: Internal Medicine

## 2016-07-19 ENCOUNTER — Other Ambulatory Visit: Payer: Self-pay | Admitting: *Deleted

## 2016-07-19 ENCOUNTER — Other Ambulatory Visit: Payer: Self-pay | Admitting: Internal Medicine

## 2016-07-19 MED ORDER — LORATADINE 10 MG PO TABS: ORAL_TABLET | ORAL | 5 refills | Status: DC

## 2016-07-19 MED ORDER — SIMVASTATIN 20 MG PO TABS: ORAL_TABLET | ORAL | 1 refills | Status: DC

## 2016-07-19 NOTE — Progress Notes (Signed)
Refill loratadine request.

## 2016-07-20 ENCOUNTER — Other Ambulatory Visit: Payer: Self-pay

## 2016-07-20 MED ORDER — SIMVASTATIN 20 MG PO TABS: ORAL_TABLET | ORAL | 0 refills | Status: DC

## 2016-11-18 ENCOUNTER — Other Ambulatory Visit: Payer: Self-pay | Admitting: Internal Medicine

## 2016-12-17 ENCOUNTER — Other Ambulatory Visit: Payer: Self-pay | Admitting: Internal Medicine

## 2017-01-15 ENCOUNTER — Other Ambulatory Visit: Payer: Self-pay | Admitting: Radiology

## 2017-01-15 MED ORDER — SIMVASTATIN 20 MG PO TABS: ORAL_TABLET | ORAL | 0 refills | Status: DC

## 2017-01-16 ENCOUNTER — Other Ambulatory Visit: Payer: Self-pay | Admitting: Internal Medicine

## 2017-01-18 ENCOUNTER — Telehealth: Payer: Self-pay | Admitting: Internal Medicine

## 2017-01-18 ENCOUNTER — Encounter: Payer: Self-pay | Admitting: Internal Medicine

## 2017-01-18 ENCOUNTER — Ambulatory Visit (INDEPENDENT_AMBULATORY_CARE_PROVIDER_SITE_OTHER): Payer: BLUE CROSS/BLUE SHIELD | Admitting: Internal Medicine

## 2017-01-18 VITALS — BP 130/80 | HR 77 | Resp 16 | Ht 68.0 in | Wt 187.0 lb

## 2017-01-18 DIAGNOSIS — R5383 Other fatigue: Secondary | ICD-10-CM | POA: Diagnosis not present

## 2017-01-18 DIAGNOSIS — Z125 Encounter for screening for malignant neoplasm of prostate: Secondary | ICD-10-CM | POA: Diagnosis not present

## 2017-01-18 DIAGNOSIS — E7800 Pure hypercholesterolemia, unspecified: Secondary | ICD-10-CM | POA: Diagnosis not present

## 2017-01-18 DIAGNOSIS — Z79899 Other long term (current) drug therapy: Secondary | ICD-10-CM | POA: Diagnosis not present

## 2017-01-18 DIAGNOSIS — K429 Umbilical hernia without obstruction or gangrene: Secondary | ICD-10-CM

## 2017-01-18 DIAGNOSIS — E559 Vitamin D deficiency, unspecified: Secondary | ICD-10-CM | POA: Diagnosis not present

## 2017-01-18 DIAGNOSIS — Z0000 Encounter for general adult medical examination without abnormal findings: Secondary | ICD-10-CM | POA: Diagnosis not present

## 2017-01-18 DIAGNOSIS — E663 Overweight: Secondary | ICD-10-CM | POA: Diagnosis not present

## 2017-01-18 LAB — PSA: PSA: 2.4 ng/mL (ref <=4.0)

## 2017-01-18 LAB — COMPREHENSIVE METABOLIC PANEL
ALT: 22 U/L (ref 9–46)
AST: 23 U/L (ref 10–35)
Albumin: 4.6 g/dL (ref 3.6–5.1)
Alkaline Phosphatase: 66 U/L (ref 40–115)
BUN: 18 mg/dL (ref 7–25)
CO2: 29 mmol/L (ref 20–31)
Calcium: 9.6 mg/dL (ref 8.6–10.3)
Chloride: 103 mmol/L (ref 98–110)
Creat: 0.99 mg/dL (ref 0.70–1.25)
Glucose, Bld: 91 mg/dL (ref 65–99)
Potassium: 4.3 mmol/L (ref 3.5–5.3)
Sodium: 140 mmol/L (ref 135–146)
Total Bilirubin: 0.5 mg/dL (ref 0.2–1.2)
Total Protein: 7.2 g/dL (ref 6.1–8.1)

## 2017-01-18 LAB — TSH: TSH: 3.47 mIU/L (ref 0.40–4.50)

## 2017-01-18 NOTE — Progress Notes (Signed)
[name], male    DOB: 15-Feb-1951  Age: 66 y.o. MRN: [id number]  The patient is here for annual  examination and management of other chronic and acute problems.  Labs due Wt gain discussed "I eat healthy" all the time.  No snacking after dinner    The risk factors are reflected in the social history.  The roster of all physicians providing medical care to patient - is listed in the Snapshot section of the chart.  Home safety : The patient has smoke detectors in the home. They wear seatbelts.  There are no firearms at home. There is no violence in the home.   There is no risks for hepatitis, STDs or HIV. There is no   history of blood transfusion. They have no travel history to infectious disease endemic areas of the world.  The patient has seen their dentist in the last six month. They have seen their eye doctor in the last year.  They do not  have excessive sun exposure. Discussed the need for sun protection: hats, long sleeves and use of sunscreen if there is significant sun exposure.   Diet: the importance of a healthy diet is discussed. They do have a healthy diet.  The benefits of regular aerobic exercise were discussed.   Depression screen: there are no signs or vegative symptoms of depression- irritability, change in appetite, anhedonia, sadness/tearfullness.  The following portions of the patient's history were reviewed and updated as appropriate: allergies, current medications, past family history, past medical history,  past surgical history, past social history  and problem list.  Visual acuity was not assessed per patient preference since she has regular follow up with her ophthalmologist. Hearing and body mass index were assessed and reviewed.   During the course of the visit the patient was educated and counseled about appropriate screening and preventive services including : fall prevention , diabetes screening, nutrition counseling, colorectal cancer  screening, and recommended immunizations.    CC: The primary encounter diagnosis was Long-term use of high-risk medication. Diagnoses of Prostate cancer screening, Vitamin D deficiency, Fatigue, unspecified type, Umbilical hernia without obstruction and without gangrene, Overweight, Visit for preventive health examination, and Pure hypercholesterolemia were also pertinent to this visit.  History Edward Tran has no past medical history on file.   He has no past surgical history on file.   His family history includes COPD in his mother and sister; Cancer in his maternal grandmother and sister; Early death in his father; Stroke (age of onset: 55) in his mother; Stroke (age of onset: 62) in his maternal grandmother.He reports that he has never smoked. He has never used smokeless tobacco. He reports that he drinks about 2.4 oz of alcohol per week . He reports that he does not use drugs.  Outpatient Medications Prior to Visit  Medication Sig Dispense Refill  . Biotin 5000 MCG CAPS Take 1 capsule by mouth daily.    Marland Kitchen loratadine (CLARITIN) 10 MG tablet TAKE 1 TABLET BY MOUTH EVERY DAY FOR ALLERGIES (OTC NOT COVERED) 30 tablet 5  . Omega-3 Fatty Acids (FISH OIL PO) Take 1,400 mg by mouth daily.     . traMADol (ULTRAM) 50 MG tablet Take 1 tablet (50 mg total) by mouth every 8 (eight) hours as needed. 90 tablet 0  . ergocalciferol (DRISDOL) 50000 units capsule Take 1 capsule (50,000 Units total) by mouth once a week. 12 capsule 0  . simvastatin (ZOCOR) 20 MG tablet NEEDS TO KEEP APT FOR  FURTHER REFILLS  TAKE 1 TABLET (20 MG TOTAL) BY MOUTH AT BEDTIME. 30 tablet 0   Facility-Administered Medications Prior to Visit  Medication Dose Route Frequency Provider Last Rate Last Dose  . lidocaine (XYLOCAINE) 1 % (with pres) injection 4 mL  4 mL Infiltration Once Crecencio Mc, MD      . lidocaine (XYLOCAINE) 1 % (with pres) injection 4 mL  4 mL Other Once Crecencio Mc, MD      . lidocaine (XYLOCAINE) 2 % (with  pres) injection 100 mg  5 mL Infiltration Once Crecencio Mc, MD      . methylPREDNISolone acetate (DEPO-MEDROL) injection 40 mg  40 mg Intra-articular Once Crecencio Mc, MD      . triamcinolone acetonide (KENALOG-40) injection 20 mg  20 mg Intramuscular Once Crecencio Mc, MD      . triamcinolone acetonide (KENALOG-40) injection 40 mg  40 mg Intra-articular Once Crecencio Mc, MD        Review of Systems   Patient denies headache, fevers, malaise, unintentional weight loss, skin rash, eye pain, sinus congestion and sinus pain, sore throat, dysphagia,  hemoptysis , cough, dyspnea, wheezing, chest pain, palpitations, orthopnea, edema, abdominal pain, nausea, melena, diarrhea, constipation, flank pain, dysuria, hematuria, urinary  Frequency, nocturia, numbness, tingling, seizures,  Focal weakness, Loss of consciousness,  Tremor, insomnia, depression, anxiety, and suicidal ideation.      Objective:  BP 130/80   Pulse 77   Resp 16   Ht 5\' 8"  (1.727 m)   Wt 187 lb (84.8 kg)   SpO2 95%   BMI 28.43 kg/m   Physical Exam   General appearance: alert, cooperative and appears stated age Ears: normal TM's and external ear canals both ears Throat: lips, mucosa, and tongue normal; teeth and gums normal Neck: no adenopathy, no carotid bruit, supple, symmetrical, trachea midline and thyroid not enlarged, symmetric, no tenderness/mass/nodules Back: symmetric, no curvature. ROM normal. No CVA tenderness. Lungs: clear to auscultation bilaterally Heart: regular rate and rhythm, S1, S2 normal, no murmur, click, rub or gallop Abdomen: soft, non-tender; bowel sounds normal; small non reducible umbilical hernia no masses,  no organomegaly Pulses: 2+ and symmetric Skin: Skin color, texture, turgor normal. No rashes or lesions Lymph nodes: Cervical, supraclavicular, and axillary nodes normal.    Assessment & Plan:   Problem List Items Addressed This Visit    Hyperlipidemia    LDL and  triglycerides are overdue .  He has no side effects from simvastatin .  Will assess liver function and if normal resume/refill simvastatin at 20 mg daily .  Lab Results  Component Value Date   CHOL 150 12/30/2015   HDL 45.30 12/30/2015   LDLCALC 71 12/30/2015   LDLDIRECT 79.0 12/30/2015   TRIG 170.0 (H) 12/30/2015   CHOLHDL 3 12/30/2015   Lab Results  Component Value Date   ALT 22 01/18/2017   AST 23 01/18/2017   ALKPHOS 66 01/18/2017   BILITOT 0.5 01/18/2017           Relevant Medications   simvastatin (ZOCOR) 20 MG tablet   Overweight    I spent 15 minutes addressing  BMI and recommended wt loss of 10% of body weight over the next 6 months using a low fat, low starch, high protein  fruit/vegetable based Mediterranean diet and 30 minutes of aerobic exercise a minimum of 5 days per week.        Umbilical hernia without obstruction and without gangrene  Referring to Twilight for evaluation       Relevant Orders   Ambulatory referral to General Surgery   Visit for preventive health examination    Annual comprehensive preventive exam was done as well as an evaluation and management of chronic conditions .  During the course of the visit the patient was educated and counseled about appropriate screening and preventive services including :  diabetes screening, lipid analysis with projected  10 year  risk for CAD , nutrition counseling, breast, cervical and colorectal cancer screening, and recommended immunizations.  Printed recommendations for health maintenance screenings was given.      Vitamin D deficiency   Relevant Orders   VITAMIN D 25 Hydroxy (Vit-D Deficiency, Fractures) (Completed)    Other Visit Diagnoses    Long-term use of high-risk medication    -  Primary   Relevant Orders   Comprehensive metabolic panel (Completed)   Prostate cancer screening       Relevant Orders   PSA (Completed)   Fatigue, unspecified type       Relevant Orders   TSH (Completed)       I have discontinued Mr. Blish ergocalciferol. I have also changed his simvastatin. Additionally, I am having him maintain his Omega-3 Fatty Acids (FISH OIL PO), traMADol, Biotin, loratadine, and Cholecalciferol (VITAMIN D3 PO). We will continue to administer methylPREDNISolone acetate, lidocaine, lidocaine, triamcinolone acetonide, triamcinolone acetonide, and lidocaine.  Meds ordered this encounter  Medications  . Cholecalciferol (VITAMIN D3 PO)    Sig: Take 1 each by mouth daily.  . simvastatin (ZOCOR) 20 MG tablet    Sig: Take 1 tablet (20 mg total) by mouth daily at 6 PM.    Dispense:  90 tablet    Refill:  1    KEEP ON FILE FOR FUTURE REFILLS    Medications Discontinued During This Encounter  Medication Reason  . ergocalciferol (DRISDOL) 50000 units capsule Completed Course  . simvastatin (ZOCOR) 20 MG tablet Reorder    Follow-up: No Follow-up on file.   Crecencio Mc, MD

## 2017-01-18 NOTE — Patient Instructions (Addendum)
You are 15 lbs heaver than Sept 2015 !  Commit to 5 days of exercise per week   I will refill your simvastatin once I confirm that your lfts are normal   We did all your non fasting labs today so there  Is no rush to do the lipids    Referral to dr. Bary Castilla in process

## 2017-01-18 NOTE — Progress Notes (Signed)
Pre visit review using our clinic review tool, if applicable. No additional management support is needed unless otherwise documented below in the visit note. 

## 2017-01-18 NOTE — Telephone Encounter (Signed)
Pt stated he needs fasting labs. Need order for fasting lab. Pt is scheduled on 01/22/2017. Thank you!

## 2017-01-19 LAB — VITAMIN D 25 HYDROXY (VIT D DEFICIENCY, FRACTURES): Vit D, 25-Hydroxy: 37 ng/mL (ref 30–100)

## 2017-01-20 ENCOUNTER — Other Ambulatory Visit: Payer: Self-pay | Admitting: Internal Medicine

## 2017-01-20 DIAGNOSIS — K429 Umbilical hernia without obstruction or gangrene: Secondary | ICD-10-CM

## 2017-01-20 HISTORY — DX: Umbilical hernia without obstruction or gangrene: K42.9

## 2017-01-20 MED ORDER — SIMVASTATIN 20 MG PO TABS: 20.0000 mg | ORAL_TABLET | Freq: Every day | ORAL | 1 refills | Status: DC

## 2017-01-20 NOTE — Assessment & Plan Note (Signed)
I spent 15 minutes addressing  BMI and recommended wt loss of 10% of body weight over the next 6 months using a low fat, low starch, high protein  fruit/vegetable based Mediterranean diet and 30 minutes of aerobic exercise a minimum of 5 days per week.

## 2017-01-20 NOTE — Assessment & Plan Note (Signed)
Referring to Edward Tran for evaluation

## 2017-01-20 NOTE — Assessment & Plan Note (Addendum)
Annual comprehensive preventive exam was done as well as an evaluation and management of chronic conditions .  During the course of the visit the patient was educated and counseled about appropriate screening and preventive services including :  diabetes screening, lipid analysis with projected  10 year  risk for CAD , nutrition counseling, breast, cervical and colorectal cancer screening, and recommended immunizations.  Printed recommendations for health maintenance screenings was given 

## 2017-01-20 NOTE — Assessment & Plan Note (Signed)
LDL and triglycerides are overdue .  He has no side effects from simvastatin .  Will assess liver function and if normal resume/refill simvastatin at 20 mg daily .  Lab Results  Component Value Date   CHOL 150 12/30/2015   HDL 45.30 12/30/2015   LDLCALC 71 12/30/2015   LDLDIRECT 79.0 12/30/2015   TRIG 170.0 (H) 12/30/2015   CHOLHDL 3 12/30/2015   Lab Results  Component Value Date   ALT 22 01/18/2017   AST 23 01/18/2017   ALKPHOS 66 01/18/2017   BILITOT 0.5 01/18/2017

## 2017-01-21 ENCOUNTER — Encounter: Payer: Self-pay | Admitting: Internal Medicine

## 2017-01-21 ENCOUNTER — Telehealth: Payer: Self-pay | Admitting: Radiology

## 2017-01-21 DIAGNOSIS — E782 Mixed hyperlipidemia: Secondary | ICD-10-CM

## 2017-01-21 NOTE — Telephone Encounter (Signed)
See 01/18/17 labs. Does he need anything additional? Lipids, etc.

## 2017-01-21 NOTE — Telephone Encounter (Signed)
ordered

## 2017-01-21 NOTE — Telephone Encounter (Signed)
Pt coming in for labs tomorrow, please place future orders. Thank you.  

## 2017-01-21 NOTE — Addendum Note (Signed)
Addended by: Crecencio Mc on: 01/21/2017 11:41 AM   Modules accepted: Orders

## 2017-01-22 ENCOUNTER — Telehealth: Payer: Self-pay | Admitting: General Surgery

## 2017-01-22 ENCOUNTER — Other Ambulatory Visit (INDEPENDENT_AMBULATORY_CARE_PROVIDER_SITE_OTHER): Payer: BLUE CROSS/BLUE SHIELD

## 2017-01-22 DIAGNOSIS — E782 Mixed hyperlipidemia: Secondary | ICD-10-CM | POA: Diagnosis not present

## 2017-01-22 LAB — LIPID PANEL
Cholesterol: 143 mg/dL (ref 0–200)
HDL: 46.8 mg/dL (ref >39.00)
LDL Cholesterol: 78 mg/dL (ref 0–99)
NonHDL: 96.19
Total CHOL/HDL Ratio: 3
Triglycerides: 91 mg/dL (ref 0.0–149.0)
VLDL: 18.2 mg/dL (ref 0.0–40.0)

## 2017-01-22 NOTE — Telephone Encounter (Signed)
no

## 2017-01-22 NOTE — Telephone Encounter (Signed)
L/m for pt to ret call to schedule an appointment with Dr Bary Castilla for umbilical hernia ref from dr tullo/mth

## 2017-01-22 NOTE — Telephone Encounter (Signed)
Pt informed

## 2017-01-23 ENCOUNTER — Encounter: Payer: Self-pay | Admitting: Internal Medicine

## 2017-01-24 ENCOUNTER — Encounter: Payer: Self-pay | Admitting: Internal Medicine

## 2017-02-14 ENCOUNTER — Other Ambulatory Visit: Payer: Self-pay | Admitting: Internal Medicine

## 2017-03-21 ENCOUNTER — Other Ambulatory Visit: Payer: Self-pay | Admitting: Internal Medicine

## 2017-06-19 ENCOUNTER — Other Ambulatory Visit: Payer: Self-pay | Admitting: Internal Medicine

## 2017-07-17 ENCOUNTER — Other Ambulatory Visit: Payer: Self-pay | Admitting: Internal Medicine

## 2017-09-13 ENCOUNTER — Other Ambulatory Visit: Payer: Self-pay | Admitting: Internal Medicine

## 2017-09-14 ENCOUNTER — Other Ambulatory Visit: Payer: Self-pay | Admitting: Internal Medicine

## 2017-10-11 ENCOUNTER — Encounter: Payer: Self-pay | Admitting: Internal Medicine

## 2017-10-16 ENCOUNTER — Ambulatory Visit: Payer: BLUE CROSS/BLUE SHIELD | Admitting: Family Medicine

## 2017-10-16 NOTE — Telephone Encounter (Signed)
Patient was scheduled to see Delano Metz NP, reviewed mychart message and called patient because it seems as though the patient would like a cortisone injection. Delano Metz states she does not do cortisone injection. Patient states he does not see a reason to still keep appointment and would like to see Dr.Tullo for an injection as soon as possible.

## 2017-10-18 ENCOUNTER — Telehealth: Payer: Self-pay

## 2017-10-18 NOTE — Telephone Encounter (Signed)
Error

## 2017-10-23 NOTE — Telephone Encounter (Signed)
Pt is scheduled for 10/28/2017 with Dr. Derrel Nip for left shoulder injection. Pt is aware of appt date and time.

## 2017-10-28 ENCOUNTER — Encounter: Payer: Self-pay | Admitting: Internal Medicine

## 2017-10-28 ENCOUNTER — Ambulatory Visit: Payer: BLUE CROSS/BLUE SHIELD | Admitting: Internal Medicine

## 2017-10-28 DIAGNOSIS — M7552 Bursitis of left shoulder: Secondary | ICD-10-CM

## 2017-10-28 MED ORDER — MELOXICAM 15 MG PO TABS: 15.0000 mg | ORAL_TABLET | Freq: Every day | ORAL | 5 refills | Status: DC

## 2017-10-28 NOTE — Patient Instructions (Signed)
You received a steroid injection in your left subacromial bursa.  Feel free to use ice on your shoulder tonight if needed for  Soreness  Avoid strenuous use of your left arm (no overhead activity) for 48 hours,  And no weight lifting for one to two weeks  You can use the meloxicam once daily instead of motrin .  Ok to add up to 2000 mg tylenol daily as well

## 2017-10-28 NOTE — Progress Notes (Addendum)
Subjective:  [name], male    DOB: [birth year]  Age: 66 y.o. MRN: [id number]  CC: The encounter diagnosis was Subacromial bursitis of left shoulder joint.  HPI [name] presents for evaluation and treatment of left shoulder pain that has been present for 2 months.  The pain developed after working out with nautilus machines,. Has been persistent .   Denies neck pain, numbness and tingling.    Aggravated by  falling down a flight of stairs during an [place] stay [place] , had a right heel bruise.  Has been slow to resolve for the past 4 weeks.     Outpatient Medications Prior to Visit  Medication Sig Dispense Refill  . Cholecalciferol (VITAMIN D3 PO) Take 1 each by mouth daily.    Marland Kitchen loratadine (CLARITIN) 10 MG tablet TAKE 1 TABLET BY MOUTH EVERY DAY FOR ALLERGIES (OTC NOT COVERED) 30 tablet 1  . Omega-3 Fatty Acids (FISH OIL PO) Take 1,400 mg by mouth daily.     . simvastatin (ZOCOR) 20 MG tablet Take 1 tablet (20 mg total) by mouth daily at 6 PM. 90 tablet 1  . simvastatin (ZOCOR) 20 MG tablet TAKE 1 TABLET (20 MG TOTAL) BY MOUTH AT BEDTIME. 30 tablet 1  . Biotin 5000 MCG CAPS Take 1 capsule by mouth daily.    . traMADol (ULTRAM) 50 MG tablet Take 1 tablet (50 mg total) by mouth every 8 (eight) hours as needed. (Patient not taking: Reported on 10/28/2017) 90 tablet 0   Facility-Administered Medications Prior to Visit  Medication Dose Route Frequency Provider Last Rate Last Dose  . lidocaine (XYLOCAINE) 1 % (with pres) injection 4 mL  4 mL Infiltration Once Crecencio Mc, MD      . lidocaine (XYLOCAINE) 1 % (with pres) injection 4 mL  4 mL Other Once Crecencio Mc, MD      . triamcinolone acetonide (KENALOG-40) injection 20 mg  20 mg Intramuscular Once Crecencio Mc, MD      . triamcinolone acetonide (KENALOG-40) injection 40 mg  40 mg Intra-articular Once Crecencio Mc, MD      . lidocaine (XYLOCAINE) 2 % (with pres) injection 100 mg  5  mL Infiltration Once Crecencio Mc, MD      . methylPREDNISolone acetate (DEPO-MEDROL) injection 40 mg  40 mg Intra-articular Once Crecencio Mc, MD        Review of Systems;  Patient denies headache, fevers, malaise, unintentional weight loss, skin rash, eye pain, sinus congestion and sinus pain, sore throat, dysphagia,  hemoptysis , cough, dyspnea, wheezing, chest pain, palpitations, orthopnea, edema, abdominal pain, nausea, melena, diarrhea, constipation, flank pain, dysuria, hematuria, urinary  Frequency, nocturia, numbness, tingling, seizures,  Focal weakness, Loss of consciousness,  Tremor, insomnia, depression, anxiety, and suicidal ideation.      Objective:  BP 138/84 (BP Location: Right Arm, Patient Position: Sitting, Cuff Size: Normal)   Pulse 71   Temp 98.1 F (36.7 C) (Oral)   Resp 16   Wt 188 lb 6 oz (85.4 kg)   SpO2 96%   BMI 28.64 kg/m   BP Readings from Last 3 Encounters:  10/28/17 138/84  01/18/17 130/80  12/30/15 120/82    Wt Readings from Last 3 Encounters:  10/28/17 188 lb 6 oz (85.4 kg)  01/18/17 187 lb (84.8 kg)  12/30/15 183 lb (83 kg)    General appearance: alert, cooperative and appears stated age Neck: no  adenopathy, no carotid bruit, supple, symmetrical, trachea midline and thyroid not enlarged, symmetric, no tenderness/mass/nodules Back: symmetric, no curvature. ROM normal. No CVA tenderness. Lungs: clear to auscultation bilaterally Heart: regular rate and rhythm, S1, S2 normal, no murmur, click, rub or gallop MSK: left shoulder with point tenderness over the lateral shoulder,  triggered by abduction of arm. ROM limited to 60 degrees.    No results found for: HGBA1C  Lab Results  Component Value Date   CREATININE 0.99 01/18/2017   CREATININE 1.06 12/30/2015   CREATININE 0.81 12/27/2014    Lab Results  Component Value Date   WBC 5.5 07/20/2014   HGB 15.7 07/20/2014   HCT 45.4 07/20/2014   PLT 221.0 07/20/2014   GLUCOSE 91  01/18/2017   CHOL 143 01/22/2017   TRIG 91.0 01/22/2017   HDL 46.80 01/22/2017   LDLDIRECT 79.0 12/30/2015   LDLCALC 78 01/22/2017   ALT 22 01/18/2017   AST 23 01/18/2017   NA 140 01/18/2017   K 4.3 01/18/2017   CL 103 01/18/2017   CREATININE 0.99 01/18/2017   BUN 18 01/18/2017   CO2 29 01/18/2017   TSH 3.47 01/18/2017   PSA 2.4 01/18/2017    Patient was never admitted.  Assessment & Plan:   Problem List Items Addressed This Visit    Subacromial bursitis of left shoulder joint    Left shoulder,  Patient requesting steroid injection .   Informed consent for joint injection obtained.  Area was cleaned with betadine.  Subacromial bursa was injected with 20 mg Kenalog and 4 ml 1%  Xylocaine .  Procedure was tolerated well and shoulder was feeling less painful by the time she left the office.  he was advised to rest the arm for 48 hours , apply ice packs every 6 hours for 15 minutes, advised to call if she develops signs of infection  Kenalog [phone]         Relevant Medications   lidocaine (XYLOCAINE) 1 % (with pres) injection 5 mL   triamcinolone acetonide (KENALOG-40) injection 20 mg (Completed)      I have discontinued Mikai C. Nuttall's traMADol and Biotin. I am also having him start on meloxicam. Additionally, I am having him maintain his Omega-3 Fatty Acids (FISH OIL PO), Cholecalciferol (VITAMIN D3 PO), simvastatin, simvastatin, and loratadine. We will stop administering methylPREDNISolone acetate and lidocaine. Additionally, we administered triamcinolone acetonide. Additionally, we will continue to administer lidocaine, triamcinolone acetonide, triamcinolone acetonide, lidocaine, and lidocaine.  Meds ordered this encounter  Medications  . meloxicam (MOBIC) 15 MG tablet    Sig: Take 1 tablet (15 mg total) by mouth daily.    Dispense:  30 tablet    Refill:  5  . lidocaine (XYLOCAINE) 1 % (with pres) injection 5 mL  . triamcinolone acetonide (KENALOG-40) injection 20  mg    Medications Discontinued During This Encounter  Medication Reason  . Biotin 5000 MCG CAPS Patient Preference  . traMADol (ULTRAM) 50 MG tablet No longer needed (for PRN medications)  . methylPREDNISolone acetate (DEPO-MEDROL) injection 40 mg   . lidocaine (XYLOCAINE) 2 % (with pres) injection 100 mg     Follow-up: No Follow-up on file.   Crecencio Mc, MD

## 2017-10-29 ENCOUNTER — Encounter: Payer: Self-pay | Admitting: Internal Medicine

## 2017-10-29 MED ORDER — TRIAMCINOLONE ACETONIDE 40 MG/ML IJ SUSP
20.0000 mg | Freq: Once | INTRAMUSCULAR | Status: AC
  Administered 2017-11-01: 20 mg via INTRAMUSCULAR

## 2017-10-29 MED ORDER — LIDOCAINE HCL 1 % IJ SOLN: 5.0000 mL | Freq: Once | INTRAMUSCULAR | Status: DC

## 2017-10-29 NOTE — Assessment & Plan Note (Addendum)
Left shoulder,  Patient requesting steroid injection .   Informed consent for joint injection obtained.  Area was cleaned with betadine.  Subacromial bursa was injected with 20 mg Kenalog and 4 ml 1%  Xylocaine .  Procedure was tolerated well and shoulder was feeling less painful by the time she left the office.  he was advised to rest the arm for 48 hours , apply ice packs every 6 hours for 15 minutes, advised to call if she develops signs of infection  Kenalog [phone]

## 2017-11-01 DIAGNOSIS — M7552 Bursitis of left shoulder: Secondary | ICD-10-CM | POA: Diagnosis not present

## 2017-11-13 ENCOUNTER — Other Ambulatory Visit: Payer: Self-pay | Admitting: Internal Medicine

## 2017-11-15 ENCOUNTER — Other Ambulatory Visit: Payer: Self-pay | Admitting: Internal Medicine

## 2018-01-12 ENCOUNTER — Other Ambulatory Visit: Payer: Self-pay | Admitting: Internal Medicine

## 2018-01-28 ENCOUNTER — Ambulatory Visit (INDEPENDENT_AMBULATORY_CARE_PROVIDER_SITE_OTHER): Payer: BLUE CROSS/BLUE SHIELD | Admitting: Internal Medicine

## 2018-01-28 ENCOUNTER — Encounter: Payer: Self-pay | Admitting: Internal Medicine

## 2018-01-28 VITALS — BP 120/82 | HR 73 | Temp 98.4°F | Resp 15 | Ht 68.0 in | Wt 185.8 lb

## 2018-01-28 DIAGNOSIS — R5383 Other fatigue: Secondary | ICD-10-CM | POA: Diagnosis not present

## 2018-01-28 DIAGNOSIS — Z0000 Encounter for general adult medical examination without abnormal findings: Secondary | ICD-10-CM | POA: Diagnosis not present

## 2018-01-28 DIAGNOSIS — E559 Vitamin D deficiency, unspecified: Secondary | ICD-10-CM | POA: Diagnosis not present

## 2018-01-28 DIAGNOSIS — Z125 Encounter for screening for malignant neoplasm of prostate: Secondary | ICD-10-CM

## 2018-01-28 DIAGNOSIS — E7800 Pure hypercholesterolemia, unspecified: Secondary | ICD-10-CM

## 2018-01-28 DIAGNOSIS — K429 Umbilical hernia without obstruction or gangrene: Secondary | ICD-10-CM

## 2018-01-28 DIAGNOSIS — M7552 Bursitis of left shoulder: Secondary | ICD-10-CM

## 2018-01-28 MED ORDER — ZOSTER VAC RECOMB ADJUVANTED 50 MCG/0.5ML IM SUSR: 0.5000 mL | Freq: Once | INTRAMUSCULAR | 1 refills | Status: AC

## 2018-01-28 MED ORDER — SILDENAFIL CITRATE 20 MG PO TABS: 20.0000 mg | ORAL_TABLET | Freq: Three times a day (TID) | ORAL | 0 refills | Status: DC

## 2018-01-28 MED ORDER — OSELTAMIVIR PHOSPHATE 75 MG PO CAPS: 75.0000 mg | ORAL_CAPSULE | Freq: Every day | ORAL | 0 refills | Status: DC

## 2018-01-28 NOTE — Assessment & Plan Note (Signed)
He deferred surgical evaluation last year,  He remains asymptomatic and the hernia has not enlarged

## 2018-01-28 NOTE — Assessment & Plan Note (Signed)
Improved,  With avoidance of aggravating workouts.  Prn meloxicam

## 2018-01-28 NOTE — Assessment & Plan Note (Signed)
Annual comprehensive preventive exam was done as well as an evaluation and management of chronic conditions .  During the course of the visit the patient was educated and counseled about appropriate screening and preventive services including :  diabetes screening, lipid analysis with projected  10 year  risk for CAD  Which is 4.7 % using the Framingham risk calculator for women, , nutrition counseling, colorectal cancer screening, and recommended immunizations.  Printed recommendations for health maintenance screenings was given.  

## 2018-01-28 NOTE — Progress Notes (Signed)
[name], male    DOB: 1951/02/17  Age: 67 y.o. MRN: [id number]  The patient is here for annual PREVENTIVE  examination and management of other chronic and acute problems.   The risk factors are reflected in the social history.  The roster of all physicians providing medical care to patient - is listed in the Snapshot section of the chart.  Activities of daily living:  The patient is 100% independent in all ADLs: dressing, toileting, feeding as well as independent mobility  Home safety : The patient has smoke detectors in the home. They wear seatbelts.  There are no firearms at home. There is no violence in the home.   There is no risks for hepatitis, STDs or HIV. There is no   history of blood transfusion. They have no travel history to infectious disease endemic areas of the world.  The patient has seen their dentist in the last six month. They have seen their eye doctor in the last year.    Discussed the need for sun protection: hats, long sleeves and use of sunscreen if there is significant sun exposure.   Diet: the importance of a healthy diet is discussed. They do have a healthy diet.  The benefits of regular aerobic exercise were discussed. He exercises 4 times per week ,  60 minutes.   Depression screen: there are no signs or vegative symptoms of depression- irritability, change in appetite, anhedonia, sadness/tearfullness.  Cognitive assessment: the patient manages all their financial and personal affairs and is actively engaged. They could relate day,date,year and events; recalled 2/3 objects at 3 minutes; performed clock-face test normally.  The following portions of the patient's history were reviewed and updated as appropriate: allergies, current medications, past family history, past medical history,  past surgical history, past social history  and problem list.  Visual acuity was not assessed per patient preference since she has regular follow up with her  ophthalmologist. Hearing and body mass index were assessed and reviewed.   During the course of the visit the patient was educated and counseled about appropriate screening and preventive services including : fall prevention , diabetes screening, nutrition counseling, colorectal cancer screening, and recommended immunizations.    CC: The primary encounter diagnosis was Prostate cancer screening. Diagnoses of Pure hypercholesterolemia, Vitamin D deficiency, Fatigue, unspecified type, Visit for preventive health examination, Subacromial bursitis of left shoulder joint, and Umbilical hernia without obstruction and without gangrene were also pertinent to this visit.  BILATERAL SHOULDER PAIN has improved .  Uses meloxicam prn    History Edward Tran has no past medical history on file.   He has no past surgical history on file.   His family history includes COPD in his mother and sister; Cancer in his maternal grandmother and sister; Early death in his father; Stroke (age of onset: 55) in his mother; Stroke (age of onset: 19) in his maternal grandmother.He reports that  has never smoked. he has never used smokeless tobacco. He reports that he drinks about 2.4 oz of alcohol per week. He reports that he does not use drugs.  Outpatient Medications Prior to Visit  Medication Sig Dispense Refill  . Cholecalciferol (VITAMIN D3 PO) Take 1 each by mouth daily.    Marland Kitchen loratadine (CLARITIN) 10 MG tablet TAKE 1 TABLET BY MOUTH EVERY DAY FOR ALLERGIES (OTC NOT COVERED) 30 tablet 5  . meloxicam (MOBIC) 15 MG tablet Take 1 tablet (15 mg total) by mouth daily. 30 tablet 5  .  Omega-3 Fatty Acids (FISH OIL PO) Take 1,400 mg by mouth daily.     . simvastatin (ZOCOR) 20 MG tablet Take 1 tablet (20 mg total) by mouth daily at 6 PM. 90 tablet 1  . simvastatin (ZOCOR) 20 MG tablet TAKE 1 TABLET (20 MG TOTAL) BY MOUTH AT BEDTIME. (Patient not taking: Reported on 01/28/2018) 90 tablet 1   Facility-Administered Medications Prior  to Visit  Medication Dose Route Frequency Provider Last Rate Last Dose  . lidocaine (XYLOCAINE) 1 % (with pres) injection 4 mL  4 mL Infiltration Once Crecencio Mc, MD      . lidocaine (XYLOCAINE) 1 % (with pres) injection 4 mL  4 mL Other Once Crecencio Mc, MD      . lidocaine (XYLOCAINE) 1 % (with pres) injection 5 mL  5 mL Other Once Crecencio Mc, MD      . triamcinolone acetonide (KENALOG-40) injection 20 mg  20 mg Intramuscular Once Crecencio Mc, MD      . triamcinolone acetonide (KENALOG-40) injection 40 mg  40 mg Intra-articular Once Crecencio Mc, MD        Review of Systems   Patient denies headache, fevers, malaise, unintentional weight loss, skin rash, eye pain, sinus congestion and sinus pain, sore throat, dysphagia,  hemoptysis , cough, dyspnea, wheezing, chest pain, palpitations, orthopnea, edema, abdominal pain, nausea, melena, diarrhea, constipation, flank pain, dysuria, hematuria, urinary  Frequency, nocturia, numbness, tingling, seizures,  Focal weakness, Loss of consciousness,  Tremor, insomnia, depression, anxiety, and suicidal ideation.     Objective:  BP 120/82 (BP Location: Left Arm, Patient Position: Sitting, Cuff Size: Normal)   Pulse 73   Temp 98.4 F (36.9 C) (Oral)   Resp 15   Ht 5\' 8"  (1.727 m)   Wt 185 lb 12.8 oz (84.3 kg)   SpO2 96%   BMI 28.25 kg/m   Physical Exam   General appearance: alert, cooperative and appears stated age Ears: normal TM's and external ear canals both ears Throat: lips, mucosa, and tongue normal; teeth and gums normal Neck: no adenopathy, no carotid bruit, supple, symmetrical, trachea midline and thyroid not enlarged, symmetric, no tenderness/mass/nodules Back: symmetric, no curvature. ROM normal. No CVA tenderness. Lungs: clear to auscultation bilaterally Heart: regular rate and rhythm, S1, S2 normal, no murmur, click, rub or gallop Abdomen: soft, non-tender; bowel sounds normal; no masses,  no  organomegaly Pulses: 2+ and symmetric Skin: Skin color, texture, turgor normal. No rashes or lesions Lymph nodes: Cervical, supraclavicular, and axillary nodes normal.    Assessment & Plan:   Problem List Items Addressed This Visit    Hyperlipidemia    LDL and triglycerides are overdue .  He has no side effects from simvastatin .  Will assess liver function and if normal resume/refill simvastatin at 20 mg daily .        Relevant Medications   sildenafil (REVATIO) 20 MG tablet   Other Relevant Orders   Lipid Profile   Comprehensive metabolic panel   Visit for preventive health examination    Annual comprehensive preventive exam was done as well as an evaluation and management of chronic conditions .  During the course of the visit the patient was educated and counseled about appropriate screening and preventive services including :  diabetes screening, lipid analysis with projected  10 year  risk for CAD  Which is 4.7 % using the Framingham risk calculator for women, , nutrition counseling, colorectal cancer screening, and recommended immunizations.  Printed recommendations for health maintenance screenings was given.        Subacromial bursitis of left shoulder joint    Improved,  With avoidance of aggravating workouts.  Prn meloxicam       Vitamin D deficiency   Relevant Orders   VITAMIN D 25 Hydroxy (Vit-D Deficiency, Fractures)   Umbilical hernia without obstruction and without gangrene    He deferred surgical evaluation last year,  He remains asymptomatic and the hernia has not enlarged       Other Visit Diagnoses    Prostate cancer screening    -  Primary   Relevant Orders   PSA   Fatigue, unspecified type       Relevant Orders   TSH      I am having Keedan C. Chaloux start on sildenafil, oseltamivir, and Zoster Vaccine Adjuvanted. I am also having him maintain his Omega-3 Fatty Acids (FISH OIL PO), Cholecalciferol (VITAMIN D3 PO), simvastatin, meloxicam, and  loratadine. We will continue to administer lidocaine, triamcinolone acetonide, triamcinolone acetonide, lidocaine, and lidocaine.  Meds ordered this encounter  Medications  . sildenafil (REVATIO) 20 MG tablet    Sig: Take 1 tablet (20 mg total) by mouth 3 (three) times daily.    Dispense:  10 tablet    Refill:  0  . oseltamivir (TAMIFLU) 75 MG capsule    Sig: Take 1 capsule (75 mg total) by mouth daily.    Dispense:  10 capsule    Refill:  0  . Zoster Vaccine Adjuvanted [facility]) injection    Sig: Inject 0.5 mLs into the muscle once for 1 dose.    Dispense:  1 each    Refill:  1    Medications Discontinued During This Encounter  Medication Reason  . simvastatin (ZOCOR) 20 MG tablet Duplicate    Follow-up: Return in about 6 months (around 07/28/2018) for nofasting labs only .   Crecencio Mc, MD

## 2018-01-28 NOTE — Patient Instructions (Addendum)
Start the tamiflu if you have a  flu exposure (once daily for ten days)   increase dose to twice daily if you develop flu symptoms, and call for refill    Trial of generic Viagra . Start with one tablet 30 minutes prior to intercourse   Your next colonoscopy is due  In July 2020   Sildenafil tablets (Viagra) What is this medicine? SILDENAFIL (sil DEN a fil) is used to treat erection problems in men. This medicine may be used for other purposes; ask your health care provider or pharmacist if you have questions. COMMON BRAND NAME(S): Viagra What should I tell my health care provider before I take this medicine? They need to know if you have any of these conditions: -bleeding disorders -eye or vision problems, including a rare inherited eye disease called retinitis pigmentosa -anatomical deformation of the penis, Peyronie's disease, or history of priapism (painful and prolonged erection) -heart disease, angina, a history of heart attack, irregular heart beats, or other heart problems -high or low blood pressure -history of blood diseases, like sickle cell anemia or leukemia -history of stomach bleeding -kidney disease -liver disease -stroke -an unusual or allergic reaction to sildenafil, other medicines, foods, dyes, or preservatives -pregnant or trying to get pregnant -breast-feeding How should I use this medicine? Take this medicine by mouth with a glass of water. Follow the directions on the prescription label. The dose is usually taken 1 hour before sexual activity. You should not take the dose more than once per day. Do not take your medicine more often than directed. Talk to your pediatrician regarding the use of this medicine in children. This medicine is not used in children for this condition. Overdosage: If you think you have taken too much of this medicine contact a poison control center or emergency room at once. NOTE: This medicine is only for you. Do not share this  medicine with others. What if I miss a dose? This does not apply. Do not take double or extra doses. What may interact with this medicine? Do not take this medicine with any of the following medications: -cisapride -nitrates like amyl nitrite, isosorbide dinitrate, isosorbide mononitrate, nitroglycerin -riociguat This medicine may also interact with the following medications: -antiviral medicines for HIV or AIDS -bosentan -certain medicines for benign prostatic hyperplasia (BPH) -certain medicines for blood pressure -certain medicines for fungal infections like ketoconazole and itraconazole -cimetidine -erythromycin -rifampin This list may not describe all possible interactions. Give your health care provider a list of all the medicines, herbs, non-prescription drugs, or dietary supplements you use. Also tell them if you smoke, drink alcohol, or use illegal drugs. Some items may interact with your medicine. What should I watch for while using this medicine? If you notice any changes in your vision while taking this drug, call your doctor or health care professional as soon as possible. Stop using this medicine and call your health care provider right away if you have a loss of sight in one or both eyes. Contact your doctor or health care professional right away if you have an erection that lasts longer than 4 hours or if it becomes painful. This may be a sign of a serious problem and must be treated right away to prevent permanent damage. If you experience symptoms of nausea, dizziness, chest pain or arm pain upon initiation of sexual activity after taking this medicine, you should refrain from further activity and call your doctor or health care professional as soon as possible. Do  not drink alcohol to excess (examples, 5 glasses of wine or 5 shots of whiskey) when taking this medicine. When taken in excess, alcohol can increase your chances of getting a headache or getting dizzy, increasing  your heart rate or lowering your blood pressure. Using this medicine does not protect you or your partner against HIV infection (the virus that causes AIDS) or other sexually transmitted diseases. What side effects may I notice from receiving this medicine? Side effects that you should report to your doctor or health care professional as soon as possible: -allergic reactions like skin rash, itching or hives, swelling of the face, lips, or tongue -breathing problems -changes in hearing -changes in vision -chest pain -fast, irregular heartbeat -prolonged or painful erection -seizures Side effects that usually do not require medical attention (report to your doctor or health care professional if they continue or are bothersome): -back pain -dizziness -flushing -headache -indigestion -muscle aches -nausea -stuffy or runny nose This list may not describe all possible side effects. Call your doctor for medical advice about side effects. You may report side effects to FDA at 1-800-FDA-1088. Where should I keep my medicine? Keep out of reach of children. Store at room temperature between 15 and 30 degrees C (59 and 86 degrees F). Throw away any unused medicine after the expiration date. NOTE: This sheet is a summary. It may not cover all possible information. If you have questions about this medicine, talk to your doctor, pharmacist, or health care provider.  2018 Elsevier/Gold Standard (2015-11-02 12:00:25)

## 2018-01-28 NOTE — Assessment & Plan Note (Signed)
LDL and triglycerides are overdue .  He has no side effects from simvastatin .  Will assess liver function and if normal resume/refill simvastatin at 20 mg daily .

## 2018-01-29 ENCOUNTER — Encounter: Payer: Self-pay | Admitting: Internal Medicine

## 2018-01-29 DIAGNOSIS — Z125 Encounter for screening for malignant neoplasm of prostate: Secondary | ICD-10-CM

## 2018-01-29 LAB — COMPREHENSIVE METABOLIC PANEL
AG Ratio: 1.4 (calc) (ref 1.0–2.5)
ALT: 21 U/L (ref 9–46)
AST: 24 U/L (ref 10–35)
Albumin: 4.3 g/dL (ref 3.6–5.1)
Alkaline phosphatase (APISO): 70 U/L (ref 40–115)
BUN: 15 mg/dL (ref 7–25)
CO2: 24 mmol/L (ref 20–32)
Calcium: 9.9 mg/dL (ref 8.6–10.3)
Chloride: 104 mmol/L (ref 98–110)
Creat: 0.97 mg/dL (ref 0.70–1.25)
Globulin: 3 g/dL (calc) (ref 1.9–3.7)
Glucose, Bld: 95 mg/dL (ref 65–99)
Potassium: 4.5 mmol/L (ref 3.5–5.3)
Sodium: 140 mmol/L (ref 135–146)
Total Bilirubin: 0.5 mg/dL (ref 0.2–1.2)
Total Protein: 7.3 g/dL (ref 6.1–8.1)

## 2018-01-29 LAB — LIPID PANEL
Cholesterol: 157 mg/dL (ref <200)
HDL: 53 mg/dL (ref >40)
LDL Cholesterol (Calc): 72 mg/dL (calc)
Non-HDL Cholesterol (Calc): 104 mg/dL (calc) (ref <130)
Total CHOL/HDL Ratio: 3 (calc) (ref <5.0)
Triglycerides: 228 mg/dL — ABNORMAL HIGH (ref <150)

## 2018-01-29 LAB — TSH: TSH: 2.95 mIU/L (ref 0.40–4.50)

## 2018-01-29 LAB — PSA: PSA: 3.6 ng/mL (ref <=4.0)

## 2018-01-29 LAB — VITAMIN D 25 HYDROXY (VIT D DEFICIENCY, FRACTURES): Vit D, 25-Hydroxy: 32 ng/mL (ref 30–100)

## 2018-02-01 ENCOUNTER — Encounter: Payer: Self-pay | Admitting: Internal Medicine

## 2018-02-03 ENCOUNTER — Other Ambulatory Visit: Payer: Self-pay | Admitting: Internal Medicine

## 2018-02-03 MED ORDER — SILDENAFIL CITRATE 20 MG PO TABS: 20.0000 mg | ORAL_TABLET | Freq: Three times a day (TID) | ORAL | 0 refills | Status: DC

## 2018-02-03 NOTE — Telephone Encounter (Signed)
script has been mailed.

## 2018-02-07 ENCOUNTER — Encounter: Payer: Self-pay | Admitting: Internal Medicine

## 2018-02-08 ENCOUNTER — Encounter: Payer: Self-pay | Admitting: Internal Medicine

## 2018-02-24 ENCOUNTER — Encounter: Payer: Self-pay | Admitting: Urology

## 2018-02-24 ENCOUNTER — Ambulatory Visit (INDEPENDENT_AMBULATORY_CARE_PROVIDER_SITE_OTHER): Payer: BLUE CROSS/BLUE SHIELD | Admitting: Urology

## 2018-02-24 VITALS — BP 132/77 | HR 75 | Resp 16 | Ht 69.0 in | Wt 187.8 lb

## 2018-02-24 DIAGNOSIS — R972 Elevated prostate specific antigen [PSA]: Secondary | ICD-10-CM | POA: Diagnosis not present

## 2018-02-24 MED ORDER — TAMSULOSIN HCL 0.4 MG PO CAPS: 0.4000 mg | ORAL_CAPSULE | Freq: Every day | ORAL | 0 refills | Status: DC

## 2018-02-24 NOTE — Progress Notes (Signed)
02/24/2018 3:45 PM   Edward Tran 03-30-1951 [id number]  Referring provider: Crecencio Mc, MD [facility] [place], [place] [address]  Chief complaint: Sent by Dr. Derrel Nip for abnormal PSA  HPI: Edward Tran is a 67 year old male seen in consultation at the request of Dr. Derrel Nip for evaluation of an abnormal PSA velocity.    His most recent PSA results are as follows:  11/2013    2.24 12/2014      3.14 01/2017      2.4 01/2018      3.6  He denies previous history of urologic problems or prior urologic evaluation.  He has mild lower urinary tract symptoms which are not bothersome including nocturia x1 and occasional decreased force and caliber of his urinary stream.  There is no family history of prostate cancer.   PMH: History reviewed. No pertinent past medical history.  Surgical History: Past Surgical History:  Procedure Laterality Date  . TONSILLECTOMY AND ADENOIDECTOMY      Home Medications:  Allergies as of 02/24/2018   No Known Allergies     Medication List        Accurate as of 02/24/18  3:45 PM. Always use your most recent med list.          FISH OIL PO Take 1,400 mg by mouth daily.   loratadine 10 MG tablet Commonly known as:  CLARITIN TAKE 1 TABLET BY MOUTH EVERY DAY FOR ALLERGIES (OTC NOT COVERED)   meloxicam 15 MG tablet Commonly known as:  MOBIC Take 1 tablet (15 mg total) by mouth daily.   oseltamivir 75 MG capsule Commonly known as:  TAMIFLU Take 1 capsule (75 mg total) by mouth daily.   sildenafil 20 MG tablet Commonly known as:  REVATIO Take 1 tablet (20 mg total) by mouth 3 (three) times daily.   simvastatin 20 MG tablet Commonly known as:  ZOCOR Take 1 tablet (20 mg total) by mouth daily at 6 PM.   VITAMIN D3 PO Take 1 each by mouth daily.       Allergies: No Known Allergies  Family History: Family History  Problem Relation Age of Onset  . Stroke Mother 24       brain aneurysm rupture   . COPD Mother   .  Early death Father   . Stroke Maternal Grandmother 62  . Cancer Maternal Grandmother   . Cancer Sister        cervical ca mets to lung   . COPD Sister     Social History:  reports that he has never smoked. He has never used smokeless tobacco. He reports that he drinks about 2.4 oz of alcohol per week. He reports that he does not use drugs.  ROS: UROLOGY Frequent Urination?: No Hard to postpone urination?: No Burning/pain with urination?: No Get up at night to urinate?: Yes Leakage of urine?: No Urine stream starts and stops?: No Trouble starting stream?: No Do you have to strain to urinate?: No Blood in urine?: No Urinary tract infection?: No Sexually transmitted disease?: No Injury to kidneys or bladder?: No Painful intercourse?: No Weak stream?: No Erection problems?: No Penile pain?: No  Gastrointestinal Nausea?: No Vomiting?: No Indigestion/heartburn?: No Diarrhea?: No Constipation?: No  Constitutional Fever: No Night sweats?: No Weight loss?: No Fatigue?: No  Skin Skin rash/lesions?: No Itching?: No  Eyes Blurred vision?: No Double vision?: No  Ears/Nose/Throat Sore throat?: No Sinus problems?: No  Hematologic/Lymphatic Swollen glands?: No Easy bruising?: No  Cardiovascular Leg swelling?: No Chest pain?: No  Respiratory Cough?: No Shortness of breath?: No  Endocrine Excessive thirst?: No  Musculoskeletal Back pain?: No Joint pain?: No  Neurological Headaches?: No Dizziness?: No  Psychologic Depression?: No Anxiety?: No  Physical Exam: BP 132/77   Pulse 75   Resp 16   Ht 5\' 9"  (1.753 m)   Wt 187 lb 12.8 oz (85.2 kg)   SpO2 98%   BMI 27.73 kg/m   Constitutional:  Alert and oriented, No acute distress. HEENT: [place] AT, moist mucus membranes.  Trachea midline, no masses. Cardiovascular: No clubbing, cyanosis, or edema. Respiratory: Normal respiratory effort, no increased work of breathing. GI: Abdomen is soft, nontender,  nondistended, no abdominal masses GU: No CVA tenderness.  Prostate 50 g, smooth without nodules or induration.  Slight asymmetry present with L>R however consistency is normal and uniform throughout Lymph: No cervical or inguinal lymphadenopathy. Skin: No rashes, bruises or suspicious lesions. Neurologic: Grossly intact, no focal deficits, moving all 4 extremities. Psychiatric: Normal mood and affect.  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  We discussed potential causes including BPH, inflammation and prostate cancer which would be less common.  Since PSA velocity is based on 3 successive readings will repeat his PSA in approximately 1 month after a 30-day alpha-blocker course.    Edward Tran, [facility] [address], [place], [place] [address] [phone]

## 2018-03-25 ENCOUNTER — Other Ambulatory Visit: Payer: BLUE CROSS/BLUE SHIELD

## 2018-03-25 DIAGNOSIS — R972 Elevated prostate specific antigen [PSA]: Secondary | ICD-10-CM

## 2018-03-26 ENCOUNTER — Encounter: Payer: Self-pay | Admitting: Urology

## 2018-03-26 ENCOUNTER — Telehealth: Payer: Self-pay | Admitting: Urology

## 2018-03-26 ENCOUNTER — Telehealth: Payer: Self-pay

## 2018-03-26 ENCOUNTER — Ambulatory Visit (INDEPENDENT_AMBULATORY_CARE_PROVIDER_SITE_OTHER): Payer: BLUE CROSS/BLUE SHIELD | Admitting: Urology

## 2018-03-26 VITALS — BP 132/84 | HR 69 | Resp 16 | Ht 69.0 in | Wt 186.4 lb

## 2018-03-26 DIAGNOSIS — R972 Elevated prostate specific antigen [PSA]: Secondary | ICD-10-CM | POA: Diagnosis not present

## 2018-03-26 LAB — PSA: Prostate Specific Ag, Serum: 4 ng/mL (ref 0.0–4.0)

## 2018-03-26 NOTE — Telephone Encounter (Signed)
Pt informed and has an appointment scheduled for today 03/26/18.

## 2018-03-26 NOTE — Telephone Encounter (Signed)
lmom for pt to call office and schedule appointment to discuss lab work

## 2018-03-26 NOTE — Telephone Encounter (Signed)
Pt called office after he left his appointment, stated on his drive home he thought about what was talked about during his appt, pt would like to proceed with Biopsy instead of waiting to be seen in July.  Please advise. Thanks.

## 2018-03-26 NOTE — Telephone Encounter (Signed)
-----   Message from Abbie Sons, MD sent at 03/26/2018  7:01 AM EDT ----- Repeat PSA was higher at 4.0.  His PSA is elevated above baseline.  Would recommend follow-up appointment to discuss options.

## 2018-03-27 ENCOUNTER — Encounter: Payer: Self-pay | Admitting: Urology

## 2018-03-27 NOTE — Telephone Encounter (Signed)
Pt called today and stated that he never got his rx for Tamsulosin, I checked your last notes and no mention of new rx was documented. Okay to send?  Also pt has been scheduled for biopsy for first week of June.

## 2018-03-27 NOTE — Progress Notes (Signed)
03/26/2018 7:29 AM   Edward Tran 31-Aug-1951 [id number]  Referring provider: Crecencio Mc, MD [place], [place] [address]  Chief Complaint  Patient presents with  . Follow-up    HPI: 67 year old male presents for follow-up and abnormal PSA velocity.  He was initially seen on 02/24/2018 with a rise in his PSA from 2.4-3.6 over the past year.  His DRE was benign.  He had mild lower urinary tract symptoms and was started on tamsulosin x30 days with recommendations of a follow-up PSA.  His repeat PSA was higher at 4.0.  He presents today to discuss management options.   PMH: History reviewed. No pertinent past medical history.  Surgical History: Past Surgical History:  Procedure Laterality Date  . TONSILLECTOMY AND ADENOIDECTOMY      Home Medications:  Allergies as of 03/26/2018   No Known Allergies     Medication List        Accurate as of 03/26/18 11:59 PM. Always use your most recent med list.          FISH OIL PO Take 1,400 mg by mouth daily.   loratadine 10 MG tablet Commonly known as:  CLARITIN TAKE 1 TABLET BY MOUTH EVERY DAY FOR ALLERGIES (OTC NOT COVERED)   meloxicam 15 MG tablet Commonly known as:  MOBIC Take 1 tablet (15 mg total) by mouth daily.   oseltamivir 75 MG capsule Commonly known as:  TAMIFLU Take 1 capsule (75 mg total) by mouth daily.   sildenafil 20 MG tablet Commonly known as:  REVATIO Take 1 tablet (20 mg total) by mouth 3 (three) times daily.   simvastatin 20 MG tablet Commonly known as:  ZOCOR Take 1 tablet (20 mg total) by mouth daily at 6 PM.   tamsulosin 0.4 MG Caps capsule Commonly known as:  FLOMAX Take 1 capsule (0.4 mg total) by mouth daily after breakfast.   VITAMIN D3 PO Take 1 each by mouth daily.       Allergies: No Known Allergies  Family History: Family History  Problem Relation Age of Onset  . Stroke Mother 58       brain aneurysm rupture   . COPD Mother   . Early death  Father   . Stroke Maternal Grandmother 62  . Cancer Maternal Grandmother   . Cancer Sister        cervical ca mets to lung   . COPD Sister     Social History:  reports that he has never smoked. He has never used smokeless tobacco. He reports that he drinks about 2.4 oz of alcohol per week. He reports that he does not use drugs.  ROS: UROLOGY Frequent Urination?: No Hard to postpone urination?: No Burning/pain with urination?: No Get up at night to urinate?: No Leakage of urine?: No Urine stream starts and stops?: No Trouble starting stream?: No Do you have to strain to urinate?: No Blood in urine?: No Urinary tract infection?: No Sexually transmitted disease?: No Injury to kidneys or bladder?: No Painful intercourse?: No Weak stream?: No Erection problems?: No Penile pain?: No  Gastrointestinal Nausea?: No Vomiting?: No Indigestion/heartburn?: No Diarrhea?: No Constipation?: No  Constitutional Fever: No Night sweats?: No Weight loss?: No Fatigue?: No  Skin Skin rash/lesions?: No Itching?: No  Eyes Blurred vision?: No Double vision?: No  Ears/Nose/Throat Sore throat?: No Sinus problems?: No  Hematologic/Lymphatic Swollen glands?: No Easy bruising?: No  Cardiovascular Leg swelling?: No Chest pain?: No  Respiratory Cough?: No  Shortness of breath?: No  Endocrine Excessive thirst?: No  Musculoskeletal Back pain?: No Joint pain?: No  Neurological Headaches?: No Dizziness?: No  Psychologic Depression?: No Anxiety?: No  Physical Exam: BP 132/84   Pulse 69   Resp 16   Ht 5\' 9"  (1.753 m)   Wt 186 lb 6.4 oz (84.6 kg)   SpO2 98%   BMI 27.53 kg/m   Constitutional:  Alert and oriented, No acute distress. HEENT: [place] AT, moist mucus membranes.  Trachea midline, no masses. Cardiovascular: No clubbing, cyanosis, or edema. Respiratory: Normal respiratory effort, no increased work of breathing. GI: Abdomen is soft, nontender, nondistended, no  abdominal masses GU: No CVA tenderness Lymph: No cervical or inguinal lymphadenopathy. Skin: No rashes, bruises or suspicious lesions. Neurologic: Grossly intact, no focal deficits, moving all 4 extremities. Psychiatric: Normal mood and affect.  Laboratory Data: Lab Results  Component Value Date   WBC 5.5 07/20/2014   HGB 15.7 07/20/2014   HCT 45.4 07/20/2014   MCV 92.0 07/20/2014   PLT 221.0 07/20/2014    Lab Results  Component Value Date   CREATININE 0.97 01/28/2018    Lab Results  Component Value Date   PSA 3.6 01/28/2018   PSA 2.4 01/18/2017   PSA 3.14 12/27/2014      Assessment & Plan:   67 year old male with an abnormal PSA velocity.  Although PSA is a prostate cancer screening test he was informed that cancer is not the most common cause of an elevated PSA. Other potential causes including BPH and inflammation were discussed. He was informed that the only way to adequately diagnose prostate cancer would be a transrectal ultrasound and biopsy of the prostate. The procedure was discussed including potential risks of bleeding and infection/sepsis. He was also informed that a negative biopsy does not conclusively rule out the possibility that prostate cancer may be present and that continued monitoring is required. The use of newer adjunctive blood tests including PHI and 4kScore were discussed. The use of multiparametric prostate MRI was also discussed however is not typically used for initial evaluation of an elevated PSA. Continued periodic surveillance was also discussed.  He initially opted for surveillance with a follow-up PSA/possible 4K in 3 months however after he got home call back and decided he wants to proceed with prostate biopsy.    Abbie Sons, [place] [address] [place], [place] [address] [phone]

## 2018-03-28 MED ORDER — TAMSULOSIN HCL 0.4 MG PO CAPS: 0.4000 mg | ORAL_CAPSULE | Freq: Every day | ORAL | 5 refills | Status: DC

## 2018-03-28 NOTE — Telephone Encounter (Signed)
I sent the Rx.

## 2018-03-28 NOTE — Addendum Note (Signed)
Addended by: Abbie Sons on: 03/28/2018 07:52 AM   Modules accepted: Orders

## 2018-05-07 ENCOUNTER — Encounter: Payer: Self-pay | Admitting: Urology

## 2018-05-07 ENCOUNTER — Other Ambulatory Visit: Payer: Self-pay | Admitting: Urology

## 2018-05-07 ENCOUNTER — Ambulatory Visit (INDEPENDENT_AMBULATORY_CARE_PROVIDER_SITE_OTHER): Payer: BLUE CROSS/BLUE SHIELD | Admitting: Urology

## 2018-05-07 VITALS — BP 164/78 | HR 72 | Ht 69.0 in | Wt 182.6 lb

## 2018-05-07 DIAGNOSIS — R972 Elevated prostate specific antigen [PSA]: Secondary | ICD-10-CM | POA: Diagnosis not present

## 2018-05-07 MED ORDER — GENTAMICIN SULFATE 40 MG/ML IJ SOLN
80.0000 mg | Freq: Once | INTRAMUSCULAR | Status: AC
  Administered 2018-05-07: 80 mg via INTRAMUSCULAR

## 2018-05-07 MED ORDER — LEVOFLOXACIN 500 MG PO TABS: 500.0000 mg | ORAL_TABLET | Freq: Once | ORAL | Status: DC

## 2018-05-07 NOTE — Progress Notes (Signed)
Prostate Biopsy Procedure   Informed consent was obtained after discussing risks/benefits of the procedure.  A time out was performed to ensure correct patient identity.  Pre-Procedure: - Last PSA Level: 03/2018   4.0 Lab Results  Component Value Date   PSA 3.6 01/28/2018   PSA 2.4 01/18/2017   PSA 3.14 12/27/2014   - Gentamicin given prophylactically - Levaquin 500 mg administered PO -Transrectal Ultrasound performed revealing a 52 gm prostate -No significant hypoechoic or median lobe noted  Procedure: - Prostate block performed using 10 cc 1% lidocaine and biopsies taken from sextant areas, a total of 12 under ultrasound guidance.  Post-Procedure: - Patient tolerated the procedure well - He was counseled to seek immediate medical attention if experiences any severe pain, significant bleeding, or fevers - Return in one week to discuss biopsy results  John Giovanni, MD

## 2018-05-09 ENCOUNTER — Other Ambulatory Visit: Payer: Self-pay | Admitting: Internal Medicine

## 2018-05-14 ENCOUNTER — Other Ambulatory Visit: Payer: Self-pay | Admitting: Urology

## 2018-05-14 LAB — PATHOLOGY REPORT

## 2018-05-20 ENCOUNTER — Telehealth: Payer: Self-pay | Admitting: Urology

## 2018-05-20 NOTE — Telephone Encounter (Signed)
Pt LM stating he sees his bx results on his mychart but doesn't understand them. Pt asks if he can get his results over the phone and should he keep his appt for tomorrow. Please advise pt at [phone]. Thanks.

## 2018-05-21 ENCOUNTER — Ambulatory Visit (INDEPENDENT_AMBULATORY_CARE_PROVIDER_SITE_OTHER): Payer: BLUE CROSS/BLUE SHIELD | Admitting: Urology

## 2018-05-21 ENCOUNTER — Encounter: Payer: Self-pay | Admitting: Urology

## 2018-05-21 VITALS — BP 150/90 | HR 63 | Ht 69.0 in | Wt 186.0 lb

## 2018-05-21 DIAGNOSIS — C61 Malignant neoplasm of prostate: Secondary | ICD-10-CM

## 2018-05-21 NOTE — Telephone Encounter (Signed)
He was seen today in follow-up

## 2018-05-22 ENCOUNTER — Encounter: Payer: Self-pay | Admitting: Urology

## 2018-05-22 DIAGNOSIS — C61 Malignant neoplasm of prostate: Secondary | ICD-10-CM | POA: Insufficient documentation

## 2018-05-22 NOTE — Progress Notes (Signed)
05/21/2018 8:09 AM   Edward Tran [birth year] [id number]  Referring provider: Crecencio Mc, MD [place],  [address]  Chief Complaint  Patient presents with  . Results    HPI: 67 year old male presents for prostate biopsy follow-up.  Biopsy was performed on 05/07/2018 for PSA of 4.0.  Prostate volume was calculated at 52 g.  He had no post biopsy complaints.  Standard 12 core biopsies were performed.  Pathology: The left mid core showed focal Gleason 3+3 adenocarcinoma involving 5% of the submitted tissue.  There was a small focus of atypical glands suspicious for carcinoma at the left base.  The remaining biopsies were benign.   PMH: History reviewed. No pertinent past medical history.  Surgical History: Past Surgical History:  Procedure Laterality Date  . TONSILLECTOMY AND ADENOIDECTOMY      Home Medications:  Allergies as of 05/21/2018   No Known Allergies     Medication List        Accurate as of 05/21/18 11:59 PM. Always use your most recent med list.          FISH OIL PO Take 1,400 mg by mouth daily.   loratadine 10 MG tablet Commonly known as:  CLARITIN TAKE 1 TABLET BY MOUTH EVERY DAY FOR ALLERGIES (OTC NOT COVERED)   meloxicam 15 MG tablet Commonly known as:  MOBIC Take 1 tablet (15 mg total) by mouth daily.   oseltamivir 75 MG capsule Commonly known as:  TAMIFLU Take 1 capsule (75 mg total) by mouth daily.   sildenafil 20 MG tablet Commonly known as:  REVATIO Take 1 tablet (20 mg total) by mouth 3 (three) times daily.   simvastatin 20 MG tablet Commonly known as:  ZOCOR Take 1 tablet (20 mg total) by mouth daily at 6 PM.   tamsulosin 0.4 MG Caps capsule Commonly known as:  FLOMAX Take 1 capsule (0.4 mg total) by mouth daily after breakfast.   VITAMIN D3 PO Take 1 each by mouth daily.       Allergies: No Known Allergies  Family History: Family History  Problem Relation Age of Onset  . Stroke  Mother 48       brain aneurysm rupture   . COPD Mother   . Early death Father   . Stroke Maternal Grandmother 62  . Cancer Maternal Grandmother   . Cancer Sister        cervical ca mets to lung   . COPD Sister     Social History:  reports that he has never smoked. He has never used smokeless tobacco. He reports that he drinks about 2.4 oz of alcohol per week. He reports that he does not use drugs.  ROS: UROLOGY Frequent Urination?: No Hard to postpone urination?: No Burning/pain with urination?: No Get up at night to urinate?: No Leakage of urine?: No Urine stream starts and stops?: No Trouble starting stream?: No Do you have to strain to urinate?: No Blood in urine?: No Urinary tract infection?: No Sexually transmitted disease?: No Injury to kidneys or bladder?: No Painful intercourse?: No Weak stream?: No Erection problems?: No Penile pain?: No  Gastrointestinal Nausea?: No Vomiting?: No Indigestion/heartburn?: No Diarrhea?: No Constipation?: No  Constitutional Fever: No Night sweats?: No Weight loss?: No Fatigue?: No  Skin Skin rash/lesions?: No Itching?: No  Eyes Blurred vision?: No Double vision?: No  Ears/Nose/Throat Sore throat?: No Sinus problems?: No  Hematologic/Lymphatic Swollen glands?: No Easy bruising?: No  Cardiovascular Leg swelling?:  No Chest pain?: No  Respiratory Cough?: No Shortness of breath?: No  Endocrine Excessive thirst?: No  Musculoskeletal Back pain?: No Joint pain?: No  Neurological Headaches?: No Dizziness?: No  Psychologic Depression?: No Anxiety?: No  Physical Exam: BP (!) 150/90 (BP Location: Left Arm, Patient Position: Sitting, Cuff Size: Normal)   Pulse 63   Ht 5\' 9"  (1.753 m)   Wt 186 lb (84.4 kg)   BMI 27.47 kg/m   Constitutional:  Alert and oriented, No acute distress.   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  The pathology report was discussed in detail.  Curative treatment options were discussed including radical prostatectomy, radiation modalities including brachytherapy and HIFU.  Active surveillance was also discussed.  He would like to think over these options but is leaning towards active surveillance.  He was provided literature on prostate cancer treatments.  He will call back if he elects to proceed with curative treatment and will otherwise return in 3 months for a PSA/DRE.   Return in about 3 months (around 08/21/2018) for Recheck, PSA.  Greater than 50% of this 15-minute visit was spent counseling the patient.   Abbie Sons, [place] [address] [place], [place] [address] [phone]

## 2018-05-26 ENCOUNTER — Encounter: Payer: Self-pay | Admitting: Internal Medicine

## 2018-05-26 DIAGNOSIS — C61 Malignant neoplasm of prostate: Secondary | ICD-10-CM

## 2018-06-08 ENCOUNTER — Encounter: Payer: Self-pay | Admitting: Internal Medicine

## 2018-06-10 ENCOUNTER — Encounter: Payer: Self-pay | Admitting: Internal Medicine

## 2018-06-12 ENCOUNTER — Encounter: Payer: Self-pay | Admitting: Internal Medicine

## 2018-06-12 ENCOUNTER — Other Ambulatory Visit: Payer: Self-pay | Admitting: Internal Medicine

## 2018-06-25 ENCOUNTER — Other Ambulatory Visit: Payer: BLUE CROSS/BLUE SHIELD

## 2018-06-30 ENCOUNTER — Ambulatory Visit: Payer: BLUE CROSS/BLUE SHIELD | Admitting: Urology

## 2018-07-08 ENCOUNTER — Encounter: Payer: Self-pay | Admitting: Internal Medicine

## 2018-07-28 ENCOUNTER — Other Ambulatory Visit: Payer: BLUE CROSS/BLUE SHIELD

## 2018-08-14 ENCOUNTER — Other Ambulatory Visit: Payer: Self-pay | Admitting: Family Medicine

## 2018-08-14 DIAGNOSIS — C61 Malignant neoplasm of prostate: Secondary | ICD-10-CM

## 2018-08-15 ENCOUNTER — Encounter: Payer: Self-pay | Admitting: Internal Medicine

## 2018-08-15 ENCOUNTER — Ambulatory Visit (INDEPENDENT_AMBULATORY_CARE_PROVIDER_SITE_OTHER): Payer: BLUE CROSS/BLUE SHIELD | Admitting: Internal Medicine

## 2018-08-15 DIAGNOSIS — M65311 Trigger thumb, right thumb: Secondary | ICD-10-CM | POA: Diagnosis not present

## 2018-08-15 MED ORDER — DICLOFENAC SODIUM 75 MG PO TBEC: 75.0000 mg | DELAYED_RELEASE_TABLET | Freq: Two times a day (BID) | ORAL | 0 refills | Status: DC

## 2018-08-15 NOTE — Progress Notes (Signed)
Subjective:  [name], male    DOB: 04/13/1951  Age: 67 y.o. MRN: [id number]  CC: The encounter diagnosis was Trigger finger of right thumb.  HPI Edward Tran presents for evaluation of right thumb pain accompanied by restricted ROM.  No antecedent injury .  Wakes up in the morning and cannot extend thumb without assistance. Then thumb "pops' open. Similar mobility issues developing with index and middle finger of same hand.  Denies joint swellling,  Redness        Outpatient Medications Prior to Visit  Medication Sig Dispense Refill  . Cholecalciferol (VITAMIN D3 PO) Take 1 each by mouth daily.    Marland Kitchen loratadine (CLARITIN) 10 MG tablet TAKE 1 TABLET BY MOUTH EVERY DAY FOR ALLERGIES (OTC NOT COVERED) 30 tablet 5  . Omega-3 Fatty Acids (FISH OIL PO) Take 1,400 mg by mouth daily.     . sildenafil (REVATIO) 20 MG tablet Take 1 tablet (20 mg total) by mouth 3 (three) times daily. 30 tablet 0  . simvastatin (ZOCOR) 20 MG tablet Take 1 tablet (20 mg total) by mouth daily at 6 PM. 90 tablet 1  . tamsulosin (FLOMAX) 0.4 MG CAPS capsule Take 1 capsule (0.4 mg total) by mouth daily after breakfast. 30 capsule 5  . meloxicam (MOBIC) 15 MG tablet Take 1 tablet (15 mg total) by mouth daily. (Patient not taking: Reported on 08/15/2018) 30 tablet 5  . oseltamivir (TAMIFLU) 75 MG capsule Take 1 capsule (75 mg total) by mouth daily. (Patient not taking: Reported on 08/15/2018) 10 capsule 0  . simvastatin (ZOCOR) 20 MG tablet TAKE 1 TABLET BY MOUTH EVERYDAY AT BEDTIME (Patient not taking: Reported on 08/15/2018) 90 tablet 1   Facility-Administered Medications Prior to Visit  Medication Dose Route Frequency Provider Last Rate Last Dose  . levofloxacin (LEVAQUIN) tablet 500 mg  500 mg Oral Once Stoioff, Scott C, MD      . lidocaine (XYLOCAINE) 1 % (with pres) injection 4 mL  4 mL Infiltration Once Crecencio Mc, MD      . lidocaine (XYLOCAINE) 1 % (with pres) injection 4 mL  4 mL Other Once  Deborra Medina L, MD      . lidocaine (XYLOCAINE) 1 % (with pres) injection 5 mL  5 mL Other Once Crecencio Mc, MD      . triamcinolone acetonide (KENALOG-40) injection 20 mg  20 mg Intramuscular Once Crecencio Mc, MD      . triamcinolone acetonide (KENALOG-40) injection 40 mg  40 mg Intra-articular Once Crecencio Mc, MD        Review of Systems;  Patient denies headache, fevers, malaise, unintentional weight loss, skin rash, eye pain, sinus congestion and sinus pain, sore throat, dysphagia,  hemoptysis , cough, dyspnea, wheezing, chest pain, palpitations, orthopnea, edema, abdominal pain, nausea, melena, diarrhea, constipation, flank pain, dysuria, hematuria, urinary  Frequency, nocturia, numbness, tingling, seizures,  Focal weakness, Loss of consciousness,  Tremor, insomnia, depression, anxiety, and suicidal ideation.      Objective:  BP 136/88 (BP Location: Left Arm, Patient Position: Sitting, Cuff Size: Normal)   Pulse 60   Temp 98 F (36.7 C) (Oral)   Resp 14   Ht 5\' 9"  (1.753 m)   Wt 188 lb (85.3 kg)   SpO2 97%   BMI 27.76 kg/m   BP Readings from Last 3 Encounters:  08/15/18 136/88  05/21/18 (!) 150/90  05/07/18 (!) 164/78    Wt Readings from  Last 3 Encounters:  08/15/18 188 lb (85.3 kg)  05/21/18 186 lb (84.4 kg)  05/07/18 182 lb 9.6 oz (82.8 kg)    General appearance: alert, cooperative and appears stated age Lungs: clear to auscultation bilaterally Heart: regular rate and rhythm, S1, S2 normal, no murmur, click, rub or gallop Skin: Skin color, texture, turgor normal. No rashes or lesions Lymph nodes: Cervical, supraclavicular, and axillary nodes normal. MSK: trigger finger behavior noted with thumb and fingers of right hand.    No results found for: HGBA1C  Lab Results  Component Value Date   CREATININE 0.97 01/28/2018   CREATININE 0.99 01/18/2017   CREATININE 1.06 12/30/2015    Lab Results  Component Value Date   WBC 5.5 07/20/2014   HGB 15.7  07/20/2014   HCT 45.4 07/20/2014   PLT 221.0 07/20/2014   GLUCOSE 95 01/28/2018   CHOL 157 01/28/2018   TRIG 228 (H) 01/28/2018   HDL 53 01/28/2018   LDLDIRECT 79.0 12/30/2015   LDLCALC 72 01/28/2018   ALT 21 01/28/2018   AST 24 01/28/2018   NA 140 01/28/2018   K 4.5 01/28/2018   CL 104 01/28/2018   CREATININE 0.97 01/28/2018   BUN 15 01/28/2018   CO2 24 01/28/2018   TSH 2.95 01/28/2018   PSA 3.6 01/28/2018    Patient was never admitted.  Assessment & Plan:   Problem List Items Addressed This Visit    Trigger finger of thumb    Trial of NSAID (diclofenac) bid and ice.  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  Behavior modification   Refer to Ortho if no improvement in 3 weeks          I have discontinued [name]'s meloxicam and oseltamivir. I am also having him start on diclofenac. Additionally, I am having him maintain his Omega-3 Fatty Acids (FISH OIL PO), Cholecalciferol (VITAMIN D3 PO), simvastatin, sildenafil, tamsulosin, and loratadine. We will continue to administer lidocaine, triamcinolone acetonide, triamcinolone acetonide, lidocaine, lidocaine, and levofloxacin.  Meds ordered this encounter  Medications  . diclofenac (VOLTAREN) 75 MG EC tablet    Sig: Take 1 tablet (75 mg total) by mouth 2 (two) times daily.    Dispense:  60 tablet    Refill:  0    Medications Discontinued During This Encounter  Medication Reason  . meloxicam (MOBIC) 15 MG tablet Patient has not taken in last 30 days  . oseltamivir (TAMIFLU) 75 MG capsule Patient has not taken in last 30 days  . simvastatin (ZOCOR) 20 MG tablet Duplicate  . meloxicam (MOBIC) 15 MG tablet Patient has not taken in last 30 days    Follow-up: No follow-ups on file.   Crecencio Mc, MD

## 2018-08-15 NOTE — Patient Instructions (Signed)
Trial of diclofenac instead of ibuprofen ,  Twice daily   You can add up to 2000 mg of acetominophen (tylenol) every day safely  In divided doses (500 mg every 6 hours  Or 1000 mg every 12 hours.)   If no improvement  in 3 weeks,  Call for orthopedics referral    Trigger Finger Trigger finger (stenosing tenosynovitis) is a condition that causes a finger to get stuck in a bent position. Each finger has a tough, cord-like tissue that connects muscle to bone (tendon), and each tendon is surrounded by a tunnel of tissue (tendon sheath). To move your finger, your tendon needs to slide freely through the sheath. Trigger finger happens when the tendon or the sheath thickens, making it difficult to move your finger. Trigger finger can affect any finger or a thumb. It may affect more than one finger. Mild cases may clear up with rest and medicine. Severe cases require more treatment. What are the causes? Trigger finger is caused by a thickened finger tendon or tendon sheath. The cause of this thickening is not known. What increases the risk? The following factors may make you more likely to develop this condition:  Doing activities that require a strong grip.  Having rheumatoid arthritis, gout, or diabetes.  Being 56-52 years old.  Being a woman.  What are the signs or symptoms? Symptoms of this condition include:  Pain when bending or straightening your finger.  Tenderness or swelling where your finger attaches to the palm of your hand.  A lump in the palm of your hand or on the inside of your finger.  Hearing a popping sound when you try to straighten your finger.  Feeling a popping, catching, or locking sensation when you try to straighten your finger.  Being unable to straighten your finger.  How is this diagnosed? This condition is diagnosed based on your symptoms and a physical exam. How is this treated? This condition may be treated by:  Resting your finger and avoiding  activities that make symptoms worse.  Wearing a finger splint to keep your finger in a slightly bent position.  Taking NSAIDs to relieve pain and swelling.  Injecting medicine (steroids) into the tendon sheath to reduce swelling and irritation. Injections may need to be repeated.  Having surgery to open the tendon sheath. This may be done if other treatments do not work and you cannot straighten your finger. You may need physical therapy after surgery.  Follow these instructions at home:  Use moist heat to help reduce pain and swelling as told by your health care provider.  Rest your finger and avoid activities that make pain worse. Return to normal activities as told by your health care provider.  If you have a splint, wear it as told by your health care provider.  Take over-the-counter and prescription medicines only as told by your health care provider.  Keep all follow-up visits as told by your health care provider. This is important. Contact a health care provider if:  Your symptoms are not improving with home care. Summary  Trigger finger (stenosing tenosynovitis) causes your finger to get stuck in a bent position, and it can make it difficult and painful to straighten your finger.  This condition develops when a finger tendon or tendon sheath thickens.  Treatment starts with resting, wearing a splint, and taking NSAIDs.  In severe cases, surgery to open the tendon sheath may be needed. This information is not intended to replace advice given to  you by your health care provider. Make sure you discuss any questions you have with your health care provider. Document Released: 09/08/2004 Document Revised: 10/30/2016 Document Reviewed: 10/30/2016 Elsevier Interactive Patient Education  2017 [employer].

## 2018-08-17 DIAGNOSIS — M65319 Trigger thumb, unspecified thumb: Secondary | ICD-10-CM | POA: Insufficient documentation

## 2018-08-17 NOTE — Assessment & Plan Note (Signed)
Trial of NSAID (diclofenac) bid and ice.  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  Behavior modification   Refer to Ortho if no improvement in 3 weeks

## 2018-08-18 ENCOUNTER — Other Ambulatory Visit: Payer: BLUE CROSS/BLUE SHIELD

## 2018-08-18 DIAGNOSIS — C61 Malignant neoplasm of prostate: Secondary | ICD-10-CM

## 2018-08-19 LAB — PSA: Prostate Specific Ag, Serum: 3.5 ng/mL (ref 0.0–4.0)

## 2018-08-20 ENCOUNTER — Encounter: Payer: Self-pay | Admitting: Urology

## 2018-08-20 ENCOUNTER — Ambulatory Visit (INDEPENDENT_AMBULATORY_CARE_PROVIDER_SITE_OTHER): Payer: BLUE CROSS/BLUE SHIELD | Admitting: Urology

## 2018-08-20 VITALS — BP 145/95 | HR 71 | Ht 69.0 in | Wt 186.2 lb

## 2018-08-20 DIAGNOSIS — C61 Malignant neoplasm of prostate: Secondary | ICD-10-CM | POA: Diagnosis not present

## 2018-08-20 NOTE — Progress Notes (Signed)
08/20/2018 3:27 PM   Edward Tran 01/02/1951 [id number]  Referring provider: Crecencio Mc, MD [address] [place], [place] [address]  Chief Complaint  Patient presents with  . Elevated PSA   Urologic history: 1. cT1c adenocarcinoma prostate -Biopsy June 2019; PSA 4.0; volume 52 g -Left mid core with focal Gleason 3+3 at 5% -Elected active surveillance  2.  BPH with lower urinary tract symptoms -On tamsulosin   HPI: 67 year old male presents for follow-up of prostate cancer.  Biopsy was performed June 2019 with findings of low risk prostate cancer as above.  He has no complaints since his last visit.  He did see Dr. Darcus Austin at [facility] for a second opinion and it was agreed to continue active surveillance.  They recommended PSA DRE q. 6 months and a follow-up biopsy approximately 18 months after initial diagnosis.  He has no complaints today.  PSA drawn earlier this week was 3.5.   PMH: No past medical history on file.  Surgical History: Past Surgical History:  Procedure Laterality Date  . TONSILLECTOMY AND ADENOIDECTOMY      Home Medications:  Allergies as of 08/20/2018   No Known Allergies     Medication List        Accurate as of 08/20/18  3:27 PM. Always use your most recent med list.          diclofenac 75 MG EC tablet Commonly known as:  VOLTAREN Take 1 tablet (75 mg total) by mouth 2 (two) times daily.   FISH OIL PO Take 1,400 mg by mouth daily.   loratadine 10 MG tablet Commonly known as:  CLARITIN TAKE 1 TABLET BY MOUTH EVERY DAY FOR ALLERGIES (OTC NOT COVERED)   sildenafil 20 MG tablet Commonly known as:  REVATIO Take 1 tablet (20 mg total) by mouth 3 (three) times daily.   simvastatin 20 MG tablet Commonly known as:  ZOCOR Take 1 tablet (20 mg total) by mouth daily at 6 PM.   tamsulosin 0.4 MG Caps capsule Commonly known as:  FLOMAX Take 1 capsule (0.4 mg total) by mouth daily after breakfast.   VITAMIN D3 PO Take 1 each  by mouth daily.       Allergies: No Known Allergies  Family History: Family History  Problem Relation Age of Onset  . Stroke Mother 40       brain aneurysm rupture   . COPD Mother   . Early death Father   . Stroke Maternal Grandmother 62  . Cancer Maternal Grandmother   . Cancer Sister        cervical ca mets to lung   . COPD Sister     Social History:  reports that he has never smoked. He has never used smokeless tobacco. He reports that he drinks about 4.0 standard drinks of alcohol per week. He reports that he does not use drugs.  ROS: UROLOGY Frequent Urination?: No Hard to postpone urination?: No Burning/pain with urination?: No Get up at night to urinate?: No Leakage of urine?: No Urine stream starts and stops?: No Trouble starting stream?: No Do you have to strain to urinate?: No Blood in urine?: No Urinary tract infection?: No Sexually transmitted disease?: No Injury to kidneys or bladder?: No Painful intercourse?: No Weak stream?: No Erection problems?: No Penile pain?: No  Gastrointestinal Nausea?: No Vomiting?: No Indigestion/heartburn?: No Diarrhea?: No Constipation?: No  Constitutional Fever: No Night sweats?: No Weight loss?: No Fatigue?: No  Skin Skin rash/lesions?: No  Itching?: No  Eyes Blurred vision?: No Double vision?: No  Ears/Nose/Throat Sore throat?: No Sinus problems?: No  Hematologic/Lymphatic Swollen glands?: No Easy bruising?: No  Cardiovascular Leg swelling?: No Chest pain?: No  Respiratory Cough?: No Shortness of breath?: No  Endocrine Excessive thirst?: No  Musculoskeletal Back pain?: No Joint pain?: No  Neurological Headaches?: No Dizziness?: No  Psychologic Depression?: No Anxiety?: No  Physical Exam: BP (!) 145/95 (BP Location: Left Arm, Patient Position: Sitting, Cuff Size: Normal)   Pulse 71   Ht 5\' 9"  (1.753 m)   Wt 186 lb 3.2 oz (84.5 kg)   BMI 27.50 kg/m   Constitutional:  Alert  and oriented, No acute distress. HEENT: [place] AT, moist mucus membranes.  Trachea midline, no masses. Cardiovascular: No clubbing, cyanosis, or edema. Respiratory: Normal respiratory effort, no increased work of breathing. GI: Abdomen is soft, nontender, nondistended, no abdominal masses GU: No CVA tenderness.  He declined DRE as it was performed by Dr. Darcus Austin last month and was benign. Lymph: No cervical or inguinal lymphadenopathy. Skin: No rashes, bruises or suspicious lesions. Neurologic: Grossly intact, no focal deficits, moving all 4 extremities. Psychiatric: Normal mood and affect.   Assessment & Plan:   67 year old male with low risk prostate cancer on active surveillance which he desires to continue.  We again discussed confirmatory biopsy approximately 1 year post procedure although [facility] recommended 18-24 months.   Return in about 6 months (around 02/18/2019) for Recheck, PSA.  Abbie Sons, [place] [address] [place],  [address] [phone]

## 2018-08-25 LAB — PSA: PSA: 3.5

## 2018-08-26 ENCOUNTER — Telehealth: Payer: Self-pay | Admitting: Urology

## 2018-08-26 NOTE — Telephone Encounter (Signed)
FYI - pt called canceled his March 2020 appt, states he doesn't feel the need to have an office visit as he will be getting his PSA drawn by Dr. Derrel Nip and sending results to you. Also stated he was being seen at [facility] as well. I canceled March appt at pt request. Just an FYI. Thanks.

## 2018-09-16 ENCOUNTER — Telehealth: Payer: Self-pay | Admitting: Urology

## 2018-09-16 MED ORDER — TAMSULOSIN HCL 0.4 MG PO CAPS: 0.4000 mg | ORAL_CAPSULE | Freq: Every day | ORAL | 11 refills | Status: DC

## 2018-09-16 NOTE — Telephone Encounter (Signed)
Pt needs refill of Tamsulosin (90 day supply) sent to [facility] on [address] said they tried to reach out and haven't heard back.

## 2018-09-26 ENCOUNTER — Other Ambulatory Visit: Payer: Self-pay | Admitting: Internal Medicine

## 2018-09-26 DIAGNOSIS — K429 Umbilical hernia without obstruction or gangrene: Secondary | ICD-10-CM

## 2018-09-29 ENCOUNTER — Encounter: Payer: Self-pay | Admitting: *Deleted

## 2018-10-07 MED ORDER — SILDENAFIL CITRATE 20 MG PO TABS: 20.0000 mg | ORAL_TABLET | Freq: Three times a day (TID) | ORAL | 0 refills | Status: DC

## 2018-10-14 ENCOUNTER — Ambulatory Visit (INDEPENDENT_AMBULATORY_CARE_PROVIDER_SITE_OTHER): Payer: BLUE CROSS/BLUE SHIELD | Admitting: General Surgery

## 2018-10-14 ENCOUNTER — Encounter: Payer: Self-pay | Admitting: General Surgery

## 2018-10-14 ENCOUNTER — Other Ambulatory Visit: Payer: Self-pay

## 2018-10-14 VITALS — BP 130/72 | HR 76 | Resp 12 | Ht 69.0 in | Wt 180.0 lb

## 2018-10-14 DIAGNOSIS — K429 Umbilical hernia without obstruction or gangrene: Secondary | ICD-10-CM | POA: Diagnosis not present

## 2018-10-14 NOTE — Patient Instructions (Addendum)
Umbilical Hernia, Adult A hernia is a bulge of tissue that pushes through an opening between muscles. An umbilical hernia happens in the abdomen, near the belly button (umbilicus). The hernia may contain tissues from the small intestine, large intestine, or fatty tissue covering the intestines (omentum). Umbilical hernias in adults tend to get worse over time, and they require surgical treatment. There are several types of umbilical hernias. You may have:  A hernia located just above or below the umbilicus (indirect hernia). This is the most common type of umbilical hernia in adults.  A hernia that forms through an opening formed by the umbilicus (direct hernia).  A hernia that comes and goes (reducible hernia). A reducible hernia may be visible only when you strain, lift something heavy, or cough. This type of hernia can be pushed back into the abdomen (reduced).  A hernia that traps abdominal tissue inside the hernia (incarcerated hernia). This type of hernia cannot be reduced.  A hernia that cuts off blood flow to the tissues inside the hernia (strangulated hernia). The tissues can start to die if this happens. This type of hernia requires emergency treatment.  What are the causes? An umbilical hernia happens when tissue inside the abdomen presses on a weak area of the abdominal muscles. What increases the risk? You may have a greater risk of this condition if you:  Are obese.  Have had several pregnancies.  Have a buildup of fluid inside your abdomen (ascites).  Have had surgery that weakens the abdominal muscles.  What are the signs or symptoms? The main symptom of this condition is a painless bulge at or near the belly button. A reducible hernia may be visible only when you strain, lift something heavy, or cough. Other symptoms may include:  Dull pain.  A feeling of pressure.  Symptoms of a strangulated hernia may include:  Pain that gets increasingly worse.  Nausea and  vomiting.  Pain when pressing on the hernia.  Skin over the hernia becoming red or purple.  Constipation.  Blood in the stool.  How is this diagnosed? This condition may be diagnosed based on:  A physical exam. You may be asked to cough or strain while standing. These actions increase the pressure inside your abdomen and force the hernia through the opening in your muscles. Your health care provider may try to reduce the hernia by pressing on it.  Your symptoms and medical history.  How is this treated? Surgery is the only treatment for an umbilical hernia. Surgery for a strangulated hernia is done as soon as possible. If you have a small hernia that is not incarcerated, you may need to lose weight before having surgery. Follow these instructions at home:  Lose weight, if told by your health care provider.  Do not try to push the hernia back in.  Watch your hernia for any changes in color or size. Tell your health care provider if any changes occur.  You may need to avoid activities that increase pressure on your hernia.  Do not lift anything that is heavier than 10 lb (4.5 kg) until your health care provider says that this is safe.  Take over-the-counter and prescription medicines only as told by your health care provider.  Keep all follow-up visits as told by your health care provider. This is important. Contact a health care provider if:  Your hernia gets larger.  Your hernia becomes painful. Get help right away if:  You develop sudden, severe pain near the   area of your hernia.  You have pain as well as nausea or vomiting.  You have pain and the skin over your hernia changes color.  You develop a fever. This information is not intended to replace advice given to you by your health care provider. Make sure you discuss any questions you have with your health care provider. Document Released: 04/20/2016 Document Revised: 07/22/2016 Document Reviewed:  04/20/2016 Elsevier Interactive Patient Education  [employer].   The patient is scheduled for surgery at [facility] with Dr Bary Castilla on 11/10/18. He will pre admit by phone. The patient is aware of date and instructions.

## 2018-10-14 NOTE — Progress Notes (Signed)
[name], male   DOB: Oct 01, 1951, 67 y.o.   MRN: [id number]  Chief Complaint  Patient presents with  . Umbilical Hernia    HPI Edward Tran is a 67 y.o. male here today for a evaluation a umbilical hernia. Patient noticed this area about two years ago. No pain or change in size. . Moves his bowels daily.   HPI  Past Medical History:  Diagnosis Date  . Hyperlipidemia   . Prostate cancer ([place])    low score    Past Surgical History:  Procedure Laterality Date  . COLONOSCOPY  2016  . TONSILLECTOMY AND ADENOIDECTOMY      Family History  Problem Relation Age of Onset  . Stroke Mother 31       brain aneurysm rupture   . COPD Mother   . Early death Father   . Stroke Maternal Grandmother 62  . Cancer Maternal Grandmother   . Cancer Sister        cervical ca mets to lung   . COPD Sister     Social History Social History   Tobacco Use  . Smoking status: Never Smoker  . Smokeless tobacco: Never Used  Substance Use Topics  . Alcohol use: Yes    Alcohol/week: 4.0 standard drinks    Types: 4 Cans of beer per week    Comment: moderate  . Drug use: No    No Known Allergies  Current Outpatient Medications  Medication Sig Dispense Refill  . Cholecalciferol (VITAMIN D3 PO) Take 1 each by mouth daily.    Marland Kitchen loratadine (CLARITIN) 10 MG tablet TAKE 1 TABLET BY MOUTH EVERY DAY FOR ALLERGIES (OTC NOT COVERED) 30 tablet 5  . Omega-3 Fatty Acids (FISH OIL PO) Take 1,400 mg by mouth daily.     . sildenafil (REVATIO) 20 MG tablet Take 1 tablet (20 mg total) by mouth 3 (three) times daily. 30 tablet 0  . simvastatin (ZOCOR) 20 MG tablet Take 1 tablet (20 mg total) by mouth daily at 6 PM. 90 tablet 1  . tamsulosin (FLOMAX) 0.4 MG CAPS capsule Take 1 capsule (0.4 mg total) by mouth daily after breakfast. 30 capsule 11   Current Facility-Administered Medications  Medication Dose Route Frequency Provider Last Rate Last Dose  . levofloxacin (LEVAQUIN) tablet 500 mg  500  mg Oral Once Stoioff, Scott C, MD      . lidocaine (XYLOCAINE) 1 % (with pres) injection 4 mL  4 mL Infiltration Once Crecencio Mc, MD      . lidocaine (XYLOCAINE) 1 % (with pres) injection 4 mL  4 mL Other Once Deborra Medina L, MD      . lidocaine (XYLOCAINE) 1 % (with pres) injection 5 mL  5 mL Other Once Crecencio Mc, MD      . triamcinolone acetonide (KENALOG-40) injection 20 mg  20 mg Intramuscular Once Crecencio Mc, MD      . triamcinolone acetonide (KENALOG-40) injection 40 mg  40 mg Intra-articular Once Crecencio Mc, MD        Review of Systems Review of Systems  Constitutional: Negative.   Respiratory: Negative.   Cardiovascular: Negative.     Blood pressure 130/72, pulse 76, resp. rate 12, height 5\' 9"  (1.753 m), weight 180 lb (81.6 kg).  Physical Exam Physical Exam  Constitutional: He is oriented to person, place, and time. He appears well-developed and well-nourished.  Eyes: Conjunctivae are normal. No scleral icterus.  Neck: Normal range  of motion.  Cardiovascular: Normal rate, regular rhythm and normal heart sounds.  Pulmonary/Chest: Effort normal and breath sounds normal.  Abdominal: A hernia (umbilical hernia 1 cm  defect.) is present.    Lymphadenopathy:    He has no cervical adenopathy.  Neurological: He is alert and oriented to person, place, and time.  Skin: Skin is warm.    Data Reviewed Laboratory studies of January 28, 2018 showed normal electrolytes, creatinine of 0.97.  Assessment    Umbilical hernia.    Plan  Hernia precautions and incarceration were discussed with the patient. If they develop symptoms of an incarcerated hernia, they were encouraged to seek prompt medical attention.  I do not anticipate the need for prosthetic mesh.   HPI, Physical Exam, Assessment and Plan have been scribed under the direction and in the presence of Hervey Ard, MD.  Gaspar Cola, CMA  I have completed the exam and reviewed the above  documentation for accuracy and completeness.  I agree with the above.  Haematologist has been used and any errors in dictation or transcription are unintentional.  Hervey Ard, M.D., F.A.C.S.  The patient is scheduled for surgery at [facility] with Dr Bary Castilla on 11/10/18. He will pre admit by phone. The patient is aware of date and instructions.  Documented by Caryl-Lyn Otis Brace LPN  Forest Gleason Gage Treiber 10/15/2018, 8:22 PM

## 2018-10-15 ENCOUNTER — Telehealth: Payer: Self-pay

## 2018-10-15 NOTE — Telephone Encounter (Signed)
Patient called and wanted to reschedule surgery from 11/10/18 with DR Byrnett. The patient is now scheduled for surgery at [facility] on 11/17/18. He is aware to call the day before to get his arrival time.

## 2018-10-22 ENCOUNTER — Encounter: Payer: Self-pay | Admitting: *Deleted

## 2018-10-22 NOTE — Progress Notes (Signed)
FMLA papers completed, mailed, copy in chart. Return to work Dec 04 2018

## 2018-11-04 ENCOUNTER — Encounter
Admission: RE | Admit: 2018-11-04 | Discharge: 2018-11-04 | Disposition: A | Discharge status: Home / Self Care | Payer: Medicare Other | Source: Ambulatory Visit | Attending: General Surgery | Admitting: General Surgery

## 2018-11-04 ENCOUNTER — Other Ambulatory Visit: Payer: Self-pay

## 2018-11-04 HISTORY — DX: Other specified health status: Z78.9

## 2018-11-04 NOTE — Patient Instructions (Signed)
Your procedure is scheduled on:11/17/18 Report to Day Surgery. [facility] SECOND FLOOR To find out your arrival time please call [phone] between 1PM - 3PM on 11/14/18  Remember: Instructions that are not followed completely may result in serious medical risk,  up to and including death, or upon the discretion of your surgeon and anesthesiologist your  surgery may need to be rescheduled.     _X__ 1. Do not eat food after midnight the night before your procedure.                 No gum chewing or hard candies. You may drink clear liquids up to 2 hours                 before you are scheduled to arrive for your surgery- DO not drink clear                 liquids within 2 hours of the start of your surgery.                 Clear Liquids include:  water, apple juice without pulp, clear carbohydrate                 drink such as Clearfast of Gatorade, Black Coffee or Tea (Do not add                 anything to coffee or tea).  __X__2.  On the morning of surgery brush your teeth with toothpaste and water, you                may rinse your mouth with mouthwash if you wish.  Do not swallow any toothpaste of mouthwash.     _X__ 3.  No Alcohol for 24 hours before or after surgery.   _X__ 4.  Do Not Smoke or use e-cigarettes For 24 Hours Prior to Your Surgery.                 Do not use any chewable tobacco products for at least 6 hours prior to                 surgery.  ____  5.  Bring all medications with you on the day of surgery if instructed.   __X__  6.  Notify your doctor if there is any change in your medical condition      (cold, fever, infections).     Do not wear jewelry, make-up, hairpins, clips or nail polish. Do not wear lotions, powders, or perfumes. You may wear deodorant. Do not shave 48 hours prior to surgery. Men may shave face and neck. Do not bring valuables to the hospital.    [facility] is not responsible for any belongings or  valuables.  Contacts, dentures or bridgework may not be worn into surgery. Leave your suitcase in the car. After surgery it may be brought to your room. For patients admitted to the hospital, discharge time is determined by your treatment team.   Patients discharged the day of surgery will not be allowed to drive home.       _X___ Take these medicines the morning of surgery with A SIP OF WATER:    1. TAMSULOSIN  2.   3.   4.  5.  6.  ____ Fleet Enema (as directed)   ____ Use CHG Soap as directed  ____ Use inhalers on the day of surgery  ____ Stop metformin 2 days prior to  surgery    ____ Take 1/2 of usual insulin dose the night before surgery. No insulin the morning          of surgery.   ____ Stop Coumadin/Plavix/aspirin on   ____ Stop Anti-inflammatories on    __X__ Stop supplements until after surgery.  STOP FISH OIL UNTIL AFTER SURGERY  ____ Bring C-Pap to the hospital.

## 2018-11-11 ENCOUNTER — Other Ambulatory Visit: Payer: Self-pay | Admitting: Internal Medicine

## 2018-11-17 ENCOUNTER — Encounter
Admission: RE | Disposition: A | Discharge status: Home / Self Care | Payer: Self-pay | Source: Ambulatory Visit | Attending: General Surgery

## 2018-11-17 ENCOUNTER — Encounter: Payer: Self-pay | Admitting: *Deleted

## 2018-11-17 ENCOUNTER — Ambulatory Visit: Payer: BLUE CROSS/BLUE SHIELD | Admitting: Certified Registered"

## 2018-11-17 ENCOUNTER — Ambulatory Visit
Admission: RE | Admit: 2018-11-17 | Discharge: 2018-11-17 | Disposition: A | Discharge status: Home / Self Care | Payer: BLUE CROSS/BLUE SHIELD | Source: Ambulatory Visit | Attending: General Surgery | Admitting: General Surgery

## 2018-11-17 ENCOUNTER — Other Ambulatory Visit: Payer: Self-pay

## 2018-11-17 DIAGNOSIS — K429 Umbilical hernia without obstruction or gangrene: Secondary | ICD-10-CM

## 2018-11-17 DIAGNOSIS — E785 Hyperlipidemia, unspecified: Secondary | ICD-10-CM | POA: Insufficient documentation

## 2018-11-17 DIAGNOSIS — Z79899 Other long term (current) drug therapy: Secondary | ICD-10-CM | POA: Insufficient documentation

## 2018-11-17 DIAGNOSIS — Z8546 Personal history of malignant neoplasm of prostate: Secondary | ICD-10-CM | POA: Diagnosis not present

## 2018-11-17 HISTORY — PX: UMBILICAL HERNIA REPAIR: SHX196

## 2018-11-17 SURGERY — REPAIR, HERNIA, UMBILICAL, ADULT
Anesthesia: General

## 2018-11-17 MED ORDER — FENTANYL CITRATE (PF) 100 MCG/2ML IJ SOLN: 25.0000 ug | INTRAMUSCULAR | Status: DC | PRN

## 2018-11-17 MED ORDER — LACTATED RINGERS IV SOLN
INTRAVENOUS | Status: DC
  Administered 2018-11-17: 11:00:00 via INTRAVENOUS

## 2018-11-17 MED ORDER — ACETAMINOPHEN 10 MG/ML IV SOLN
INTRAVENOUS | Status: DC | PRN
  Administered 2018-11-17: 1000 mg via INTRAVENOUS

## 2018-11-17 MED ORDER — FENTANYL CITRATE (PF) 100 MCG/2ML IJ SOLN
INTRAMUSCULAR | Status: AC
  Filled 2018-11-17: qty 2

## 2018-11-17 MED ORDER — PROPOFOL 10 MG/ML IV BOLUS
INTRAVENOUS | Status: AC
  Filled 2018-11-17: qty 20

## 2018-11-17 MED ORDER — DEXAMETHASONE SODIUM PHOSPHATE 10 MG/ML IJ SOLN
INTRAMUSCULAR | Status: DC | PRN
  Administered 2018-11-17: 5 mg via INTRAVENOUS

## 2018-11-17 MED ORDER — FAMOTIDINE 20 MG PO TABS
20.0000 mg | ORAL_TABLET | Freq: Once | ORAL | Status: AC
  Administered 2018-11-17: 20 mg via ORAL

## 2018-11-17 MED ORDER — GABAPENTIN 300 MG PO CAPS
ORAL_CAPSULE | ORAL | Status: AC
  Administered 2018-11-17: 300 mg via ORAL
  Filled 2018-11-17: qty 1

## 2018-11-17 MED ORDER — FAMOTIDINE 20 MG PO TABS
ORAL_TABLET | ORAL | Status: AC
  Administered 2018-11-17: 20 mg via ORAL
  Filled 2018-11-17: qty 1

## 2018-11-17 MED ORDER — CELECOXIB 200 MG PO CAPS
ORAL_CAPSULE | ORAL | Status: AC
  Administered 2018-11-17: 200 mg via ORAL
  Filled 2018-11-17: qty 1

## 2018-11-17 MED ORDER — BUPIVACAINE HCL 0.5 % IJ SOLN
INTRAMUSCULAR | Status: DC | PRN
  Administered 2018-11-17: 30 mL

## 2018-11-17 MED ORDER — PROPOFOL 10 MG/ML IV BOLUS
INTRAVENOUS | Status: DC | PRN
  Administered 2018-11-17: 200 mg via INTRAVENOUS

## 2018-11-17 MED ORDER — OXYCODONE HCL 5 MG/5ML PO SOLN: 5.0000 mg | Freq: Once | ORAL | Status: DC | PRN

## 2018-11-17 MED ORDER — LIDOCAINE HCL (CARDIAC) PF 100 MG/5ML IV SOSY
PREFILLED_SYRINGE | INTRAVENOUS | Status: DC | PRN
  Administered 2018-11-17: 100 mg via INTRAVENOUS

## 2018-11-17 MED ORDER — MIDAZOLAM HCL 2 MG/2ML IJ SOLN
INTRAMUSCULAR | Status: AC
  Filled 2018-11-17: qty 2

## 2018-11-17 MED ORDER — EPHEDRINE SULFATE 50 MG/ML IJ SOLN
INTRAMUSCULAR | Status: DC | PRN
  Administered 2018-11-17: 10 mg via INTRAVENOUS

## 2018-11-17 MED ORDER — OXYCODONE HCL 5 MG PO TABS: 5.0000 mg | ORAL_TABLET | Freq: Once | ORAL | Status: DC | PRN

## 2018-11-17 MED ORDER — HYDROCODONE-ACETAMINOPHEN 5-325 MG PO TABS: 1.0000 | ORAL_TABLET | ORAL | 0 refills | Status: DC | PRN

## 2018-11-17 MED ORDER — ACETAMINOPHEN 10 MG/ML IV SOLN
INTRAVENOUS | Status: AC
  Filled 2018-11-17: qty 100

## 2018-11-17 MED ORDER — FENTANYL CITRATE (PF) 100 MCG/2ML IJ SOLN
INTRAMUSCULAR | Status: DC | PRN
  Administered 2018-11-17: 50 ug via INTRAVENOUS

## 2018-11-17 MED ORDER — ONDANSETRON HCL 4 MG/2ML IJ SOLN
INTRAMUSCULAR | Status: AC
  Filled 2018-11-17: qty 2

## 2018-11-17 MED ORDER — GABAPENTIN 300 MG PO CAPS
300.0000 mg | ORAL_CAPSULE | ORAL | Status: AC
  Administered 2018-11-17: 300 mg via ORAL

## 2018-11-17 MED ORDER — ONDANSETRON HCL 4 MG/2ML IJ SOLN
INTRAMUSCULAR | Status: DC | PRN
  Administered 2018-11-17: 4 mg via INTRAVENOUS

## 2018-11-17 MED ORDER — MIDAZOLAM HCL 2 MG/2ML IJ SOLN
INTRAMUSCULAR | Status: DC | PRN
  Administered 2018-11-17: 2 mg via INTRAVENOUS

## 2018-11-17 MED ORDER — CELECOXIB 200 MG PO CAPS
200.0000 mg | ORAL_CAPSULE | ORAL | Status: AC
  Administered 2018-11-17: 200 mg via ORAL

## 2018-11-17 MED ORDER — BUPIVACAINE-EPINEPHRINE (PF) 0.25% -1:200000 IJ SOLN
INTRAMUSCULAR | Status: AC
  Filled 2018-11-17: qty 30

## 2018-11-17 SURGICAL SUPPLY — 30 items
BLADE SURG 15 STRL SS SAFETY (BLADE) ×2 IMPLANT
CANISTER SUCT 1200ML W/VALVE (MISCELLANEOUS) ×2 IMPLANT
CHLORAPREP W/TINT 26ML (MISCELLANEOUS) ×2 IMPLANT
COVER WAND RF STERILE (DRAPES) ×2 IMPLANT
DRAPE LAPAROTOMY 100X77 ABD (DRAPES) ×2 IMPLANT
DRSG TEGADERM 4X4.75 (GAUZE/BANDAGES/DRESSINGS) ×2 IMPLANT
DRSG TELFA 4X3 1S NADH ST (GAUZE/BANDAGES/DRESSINGS) ×2 IMPLANT
ELECT REM PT RETURN 9FT ADLT (ELECTROSURGICAL) ×2
ELECTRODE REM PT RTRN 9FT ADLT (ELECTROSURGICAL) ×1 IMPLANT
GLOVE BIO SURGEON STRL SZ7.5 (GLOVE) ×4 IMPLANT
GLOVE INDICATOR 8.0 STRL GRN (GLOVE) ×4 IMPLANT
GOWN STRL REUS W/ TWL LRG LVL3 (GOWN DISPOSABLE) ×2 IMPLANT
GOWN STRL REUS W/TWL LRG LVL3 (GOWN DISPOSABLE) ×2
KIT TURNOVER KIT A (KITS) ×2 IMPLANT
LABEL OR SOLS (LABEL) ×2 IMPLANT
NEEDLE HYPO 22GX1.5 SAFETY (NEEDLE) ×4 IMPLANT
NEEDLE HYPO 25X1 1.5 SAFETY (NEEDLE) ×2 IMPLANT
NS IRRIG 500ML POUR BTL (IV SOLUTION) ×2 IMPLANT
PACK BASIN MINOR ARMC (MISCELLANEOUS) ×2 IMPLANT
STRIP CLOSURE SKIN 1/2X4 (GAUZE/BANDAGES/DRESSINGS) ×2 IMPLANT
SUT PROLENE 0 CT 1 30 (SUTURE) IMPLANT
SUT SURGILON 0 BLK (SUTURE) ×2 IMPLANT
SUT VIC AB 3-0 54X BRD REEL (SUTURE) ×1 IMPLANT
SUT VIC AB 3-0 BRD 54 (SUTURE) ×1
SUT VIC AB 3-0 SH 27 (SUTURE) ×1
SUT VIC AB 3-0 SH 27X BRD (SUTURE) ×1 IMPLANT
SUT VIC AB 4-0 FS2 27 (SUTURE) ×2 IMPLANT
SWABSTK COMLB BENZOIN TINCTURE (MISCELLANEOUS) ×2 IMPLANT
SYR 10ML LL (SYRINGE) ×2 IMPLANT
SYR 3ML LL SCALE MARK (SYRINGE) ×2 IMPLANT

## 2018-11-17 NOTE — H&P (Signed)
Edward Tran [id number] 1951-07-04      HPI:  Healthy 67 y/o male for umbilical hernia repair.   Facility-Administered Medications Prior to Admission  Medication Dose Route Frequency Provider Last Rate Last Dose  . levofloxacin (LEVAQUIN) tablet 500 mg  500 mg Oral Once Stoioff, Scott C, MD      . lidocaine (XYLOCAINE) 1 % (with pres) injection 4 mL  4 mL Infiltration Once Crecencio Mc, MD      . lidocaine (XYLOCAINE) 1 % (with pres) injection 4 mL  4 mL Other Once Crecencio Mc, MD      . lidocaine (XYLOCAINE) 1 % (with pres) injection 5 mL  5 mL Other Once Crecencio Mc, MD      . triamcinolone acetonide (KENALOG-40) injection 20 mg  20 mg Intramuscular Once Crecencio Mc, MD      . triamcinolone acetonide (KENALOG-40) injection 40 mg  40 mg Intra-articular Once Crecencio Mc, MD       Medications Prior to Admission  Medication Sig Dispense Refill Last Dose  . cholecalciferol (VITAMIN D) 25 MCG (1000 UT) tablet Take 1,000 Units by mouth daily.    11/17/2018 at 0700  . loratadine (CLARITIN) 10 MG tablet TAKE 1 TABLET BY MOUTH EVERY DAY FOR ALLERGIES (OTC NOT COVERED) 30 tablet 5 11/17/2018 at 0700  . Omega-3 Fatty Acids (FISH OIL) 1200 MG CAPS Take 2,400 mg by mouth daily.    11/10/2018  . sildenafil (REVATIO) 20 MG tablet Take 1 tablet (20 mg total) by mouth 3 (three) times daily. 30 tablet 0 11/16/2018 at Unknown time  . simvastatin (ZOCOR) 20 MG tablet Take 1 tablet (20 mg total) by mouth daily at 6 PM. 90 tablet 1 11/16/2018 at 2200  . tamsulosin (FLOMAX) 0.4 MG CAPS capsule Take 1 capsule (0.4 mg total) by mouth daily after breakfast. 30 capsule 11 11/17/2018 at 0700   No Known Allergies Past Medical History:  Diagnosis Date  . Hyperlipidemia   . Medical history non-contributory   . Prostate cancer ([place])    low score   Past Surgical History:  Procedure Laterality Date  . COLONOSCOPY  2016  . TONSILLECTOMY AND ADENOIDECTOMY     Social History   Socioeconomic  History  . Marital status: Married    Spouse name: Not on file  . Number of children: Not on file  . Years of education: Not on file  . Highest education level: Not on file  Occupational History  . Not on file  Social Needs  . Financial resource strain: Not on file  . Food insecurity:    Worry: Not on file    Inability: Not on file  . Transportation needs:    Medical: Not on file    Non-medical: Not on file  Tobacco Use  . Smoking status: Never Smoker  . Smokeless tobacco: Never Used  Substance and Sexual Activity  . Alcohol use: Yes    Alcohol/week: 4.0 standard drinks    Types: 4 Cans of beer per week    Comment: moderate  . Drug use: No  . Sexual activity: Yes  Lifestyle  . Physical activity:    Days per week: Not on file    Minutes per session: Not on file  . Stress: Not on file  Relationships  . Social connections:    Talks on phone: Not on file    Gets together: Not on file    Attends religious service: Not on file  Active member of club or organization: Not on file    Attends meetings of clubs or organizations: Not on file    Relationship status: Not on file  . Intimate partner violence:    Fear of current or ex partner: Not on file    Emotionally abused: Not on file    Physically abused: Not on file    Forced sexual activity: Not on file  Other Topics Concern  . Not on file  Social History Narrative  . Not on file   Social History   Social History Narrative  . Not on file     ROS: Negative.     PE: HEENT: Negative. Lungs: Clear. Cardio: RR. Abdomen: Reducible umbilical hernia, fascial defect < 2 cm.  Assessment/Plan:  Proceed with planned umbilical hernia repair.  Forest Gleason [facility] 11/17/2018

## 2018-11-17 NOTE — Progress Notes (Signed)
Dr. Bary Castilla came to visit the patient prior to his departure. Patient was stable and dull pain prior to leaving. He was alert and oriented and ready to leave.

## 2018-11-17 NOTE — Anesthesia Post-op Follow-up Note (Signed)
Anesthesia QCDR form completed.        

## 2018-11-17 NOTE — Anesthesia Procedure Notes (Signed)
Procedure Name: LMA Insertion Date/Time: 11/17/2018 1:01 PM Performed by: Aline Brochure, CRNA Pre-anesthesia Checklist: Patient identified, Emergency Drugs available, Suction available and Patient being monitored Patient Re-evaluated:Patient Re-evaluated prior to induction Oxygen Delivery Method: Circle system utilized Preoxygenation: Pre-oxygenation with 100% oxygen Induction Type: IV induction Ventilation: Mask ventilation without difficulty LMA: LMA inserted LMA Size: 5.0 Number of attempts: 1 Placement Confirmation: positive ETCO2 and breath sounds checked- equal and bilateral Tube secured with: Tape Dental Injury: Teeth and Oropharynx as per pre-operative assessment

## 2018-11-17 NOTE — Anesthesia Preprocedure Evaluation (Signed)
Anesthesia Evaluation  Patient identified by MRN, date of birth, ID band Patient awake    Reviewed: Allergy & Precautions, H&P , NPO status , Patient's Chart, lab work & pertinent test results  History of Anesthesia Complications Negative for: history of anesthetic complications  Airway Mallampati: III  TM Distance: <3 FB Neck ROM: full    Dental  (+) Chipped   Pulmonary neg pulmonary ROS, neg shortness of breath,           Cardiovascular Exercise Tolerance: Good (-) angina(-) Past MI and (-) DOE negative cardio ROS       Neuro/Psych negative neurological ROS  negative psych ROS   GI/Hepatic negative GI ROS, Neg liver ROS, neg GERD  ,  Endo/Other  negative endocrine ROS  Renal/GU      Musculoskeletal   Abdominal   Peds  Hematology negative hematology ROS (+)   Anesthesia Other Findings Past Medical History: No date: Hyperlipidemia No date: Medical history non-contributory No date: Prostate cancer ([place])     Comment:  low score  Past Surgical History: 2016: COLONOSCOPY No date: TONSILLECTOMY AND ADENOIDECTOMY     Reproductive/Obstetrics negative OB ROS                             Anesthesia Physical Anesthesia Plan  ASA: II  Anesthesia Plan: General LMA   Post-op Pain Management:    Induction: Intravenous  PONV Risk Score and Plan: Dexamethasone, Ondansetron, Midazolam and Treatment may vary due to age or medical condition  Airway Management Planned: LMA  Additional Equipment:   Intra-op Plan:   Post-operative Plan: Extubation in OR  Informed Consent: I have reviewed the patients History and Physical, chart, labs and discussed the procedure including the risks, benefits and alternatives for the proposed anesthesia with the patient or authorized representative who has indicated his/her understanding and acceptance.   Dental Advisory Given  Plan Discussed with:  Anesthesiologist, CRNA and Surgeon  Anesthesia Plan Comments: (Patient consented for risks of anesthesia including but not limited to:  - adverse reactions to medications - damage to teeth, lips or other oral mucosa - sore throat or hoarseness - Damage to heart, brain, lungs or loss of life  Patient voiced understanding.)        Anesthesia Quick Evaluation

## 2018-11-17 NOTE — Anesthesia Postprocedure Evaluation (Signed)
Anesthesia Post Note  Patient: Edward Tran  Procedure(s) Performed: HERNIA REPAIR UMBILICAL ADULT (N/A )  Patient location during evaluation: PACU Anesthesia Type: General Level of consciousness: awake and alert Pain management: pain level controlled Vital Signs Assessment: post-procedure vital signs reviewed and stable Respiratory status: spontaneous breathing, nonlabored ventilation, respiratory function stable and patient connected to nasal cannula oxygen Cardiovascular status: blood pressure returned to baseline and stable Postop Assessment: no apparent nausea or vomiting Anesthetic complications: no     Last Vitals:  Vitals:   11/17/18 1401 11/17/18 1418  BP:  133/74  Pulse: 79 77  Resp: 11 18  Temp: (!) 36.4 C 36.6 C  SpO2: 97% 98%    Last Pain:  Vitals:   11/17/18 1418  TempSrc: Oral  PainSc: 2                  Precious Haws Galen Malkowski

## 2018-11-17 NOTE — Transfer of Care (Signed)
Immediate Anesthesia Transfer of Care Note  Patient: Edward Tran  Procedure(s) Performed: HERNIA REPAIR UMBILICAL ADULT (N/A )  Patient Location: PACU  Anesthesia Type:General  Level of Consciousness: drowsy and patient cooperative  Airway & Oxygen Therapy: Patient Spontanous Breathing and Patient connected to face mask oxygen  Post-op Assessment: Report given to RN and Post -op Vital signs reviewed and stable  Post vital signs: stable  Last Vitals:  Vitals Value Taken Time  BP 127/78 11/17/2018  1:39 PM  Temp 36.1 C 11/17/2018  1:39 PM  Pulse 81 11/17/2018  1:44 PM  Resp 13 11/17/2018  1:44 PM  SpO2 100 % 11/17/2018  1:44 PM  Vitals shown include unvalidated device data.  Last Pain:  Vitals:   11/17/18 1339  TempSrc:   PainSc: Asleep         Complications: No apparent anesthesia complications

## 2018-11-17 NOTE — Discharge Instructions (Signed)
Open Hernia Repair, Adult, Care After These instructions give you information about caring for yourself after your procedure. Your doctor may also give you more specific instructions. If you have problems or questions, contact your doctor. Follow these instructions at home: Surgical cut (incision) care   Follow instructions from your doctor about how to take care of your surgical cut area. Make sure you: ? Wash your hands with soap and water before you change your bandage (dressing). If you cannot use soap and water, use hand sanitizer. ? Change your bandage as told by your doctor. ? Leave stitches (sutures), skin glue, or skin tape (adhesive) strips in place. They may need to stay in place for 2 weeks or longer. If tape strips get loose and curl up, you may trim the loose edges. Do not remove tape strips completely unless your doctor says it is okay.  Check your surgical cut every day for signs of infection. Check for: ? More redness, swelling, or pain. ? More fluid or blood. ? Warmth. ? Pus or a bad smell. Activity  Do not drive or use heavy machinery while taking prescription pain medicine. Do not drive until your doctor says it is okay.  Until your doctor says it is okay: ? Do not lift anything that is heavier than 10 lb (4.5 kg). ? Do not play contact sports.  Return to your normal activities as told by your doctor. Ask your doctor what activities are safe. General instructions  To prevent or treat having a hard time pooping (constipation) while you are taking prescription pain medicine, your doctor may recommend that you: ? Drink enough fluid to keep your pee (urine) clear or pale yellow. ? Take over-the-counter or prescription medicines. ? Eat foods that are high in fiber, such as fresh fruits and vegetables, whole grains, and beans. ? Limit foods that are high in fat and processed sugars, such as fried and sweet foods.  Take over-the-counter and prescription medicines only as  told by your doctor.  Do not take baths, swim, or use a hot tub until your doctor says it is okay.  Keep all follow-up visits as told by your doctor. This is important. Contact a doctor if:  You develop a rash.  You have more redness, swelling, or pain around your surgical cut.  You have more fluid or blood coming from your surgical cut.  Your surgical cut feels warm to the touch.  You have pus or a bad smell coming from your surgical cut.  You have a fever or chills.  You have blood in your poop (stool).  You have not pooped in 2-3 days.  Medicine does not help your pain. Get help right away if:  You have chest pain or you are short of breath.  You feel light-headed.  You feel weak and dizzy (feel faint).  You have very bad pain.  You throw up (vomit) and your pain is worse. This information is not intended to replace advice given to you by your health care provider. Make sure you discuss any questions you have with your health care provider. Document Released: 12/10/2014 Document Revised: 06/08/2016 Document Reviewed: 05/02/2016 Elsevier Interactive Patient Education  2018 [place]   1) The drugs that you were given will stay in your system until tomorrow so for the next 24 hours you should not:  A) Drive an automobile B) Make any legal decisions C) Drink any alcoholic beverage  2) You may resume regular meals tomorrow.  Today it is better to start with liquids and gradually work up to solid foods.  You may eat anything you prefer, but it is better to start with liquids, then soup and crackers, and gradually work up to solid foods.   3) Please notify your doctor immediately if you have any unusual bleeding, trouble breathing, redness and pain at the surgery site, drainage, fever, or pain not relieved by medication.    4) Additional Instructions:        Please contact your physician with any  problems or Same Day Surgery at [phone], Monday through Friday 6 am to 4 pm, or [place] at [facility] number at [phone].

## 2018-11-17 NOTE — Op Note (Signed)
Preoperative diagnosis: Umbilical hernia.  Postoperative diagnosis: Same.  Operative procedure: Primary repair of umbilical hernia.  Operating Surgeon: Hervey Ard, MD.  Anesthesia: General by LMA, Marcaine 0.  5%, plain: 30 cc  Estimated blood loss: Less than 1 cc.  Clinical note: This 67 year old male is developed a symptomatic umbilical hernia and he is been for elective repair.  Size was thought to be below that threshold needed for mesh.  Operative note: The patient underwent general anesthesia without difficulty.  The abdomen was cleansed with ChloraPrep and draped.  Marcaine was infiltrated for postoperative analgesia.  An infraumbilical incision was made sharply and remaining dissection completed with electrocautery and sharp dissection.  The skin was elevated off the underlying hernia sac.  The hernia sac was then transected at the fascial level and discarded.  The defect measured 2 cm.  Primary repair was completed making use of interrupted 0 Surgilon sutures.  These were all placed under direct vision and then tied sequentially.  The umbilical skin was tacked to the deep tissue with a 3-0 Vicryl figure-of-eight suture.  The adipose layer was approximated with a running 3-0 Vicryl suture.  The skin closed with a running 4-0 Vicryl subcuticular suture.  Benzoin and Steri-Strips followed by Telfa and Tegaderm dressing applied.  The patient tolerated the procedure well and was taken recovery room in stable condition.

## 2018-11-18 ENCOUNTER — Encounter: Payer: Self-pay | Admitting: General Surgery

## 2018-11-27 ENCOUNTER — Ambulatory Visit (INDEPENDENT_AMBULATORY_CARE_PROVIDER_SITE_OTHER): Payer: BLUE CROSS/BLUE SHIELD | Admitting: Surgery

## 2018-11-27 ENCOUNTER — Encounter: Payer: Self-pay | Admitting: Surgery

## 2018-11-27 ENCOUNTER — Encounter: Payer: BLUE CROSS/BLUE SHIELD | Admitting: General Surgery

## 2018-11-27 ENCOUNTER — Other Ambulatory Visit: Payer: Self-pay

## 2018-11-27 VITALS — BP 149/91 | HR 80 | Temp 97.5°F | Ht 69.0 in | Wt 187.0 lb

## 2018-11-27 DIAGNOSIS — Z4889 Encounter for other specified surgical aftercare: Secondary | ICD-10-CM

## 2018-11-27 DIAGNOSIS — Z09 Encounter for follow-up examination after completed treatment for conditions other than malignant neoplasm: Secondary | ICD-10-CM

## 2018-11-27 DIAGNOSIS — K429 Umbilical hernia without obstruction or gangrene: Secondary | ICD-10-CM

## 2018-11-27 NOTE — Progress Notes (Signed)
Surgical Clinic Progress/Follow-up Note   HPI:  67 y.o. Male presents to clinic for post-op follow-up 10 Days s/p primary repair of umbilical hernia ([facility], 11/17/2018). Patient reports complete resolution of pre-operative pain and has been tolerating regular diet with +flatus and normal BM's, denies N/V, fever/chills, CP, or SOB.  Review of Systems:  Constitutional: denies fever/chills  Respiratory: denies shortness of breath, wheezing  Cardiovascular: denies chest pain, palpitations  Gastrointestinal: abdominal pain, N/V, and bowel function as per interval history Skin: Denies any other rashes or skin discolorations except post-surgical wounds as per interval history  Vital Signs:  BP (!) 149/91   Pulse 80   Temp (!) 97.5 F (36.4 C) (Temporal)   Ht 5\' 9"  (1.753 m)   Wt 187 lb (84.8 kg)   SpO2 96%   BMI 27.62 kg/m    Physical Exam:  Constitutional:  -- Normal body habitus  -- Awake, alert, and oriented x3  Pulmonary:  -- No crackles -- Equal breath sounds bilaterally -- Breathing non-labored at rest Cardiovascular:  -- S1, S2 present  -- No pericardial rubs  Gastrointestinal:  -- Soft and non-distended, non-tender to palpation, no guarding/rebound tenderness -- Post-surgical incisions all well-approximated without any peri-incisional erythema or drainage -- No abdominal masses appreciated, pulsatile or otherwise  Musculoskeletal / Integumentary:  -- Wounds or skin discoloration: None appreciated except post-surgical incisions as described above (GI) -- Extremities: B/L UE and LE FROM, hands and feet warm, no edema   Imaging: No new pertinent imaging available for review  Assessment:  67 y.o. yo Male with a problem list including...  Patient Active Problem List   Diagnosis Date Noted  . Trigger finger of thumb 08/17/2018  . Prostate cancer ([place]) 05/22/2018  . Umbilical hernia without obstruction and without gangrene 01/20/2017  . Vitamin D deficiency  01/01/2016  . Overweight 12/29/2014  . Adenomatous polyp of colon 06/20/2014  . Family history of cerebral aneurysm 10/27/2013  . Subacromial bursitis of left shoulder joint 12/04/2012  . Visit for preventive health examination 10/25/2012  . Screening for prostate cancer 08/09/2011  . Screening for colon cancer 08/09/2011  . Hyperlipidemia 08/09/2011    presents to clinic for post-op follow-up evaluation, doing well 10 Days s/p primary repair of umbilical hernia ([facility], 11/17/2018).  Plan:              - advance diet as tolerated              - okay to submerge incisions under water (baths, swimming) after 2 weeks post-op             - no heavy lifting >40 lbs x 4 more weeks, after which may gradually resume all activities without restrictions             - apply sunblock particularly to incisions with sun exposure to reduce pigmentation of scars             - return to clinic as needed, instructed to call office if any questions or concerns  All of the above recommendations were discussed with the patient and patient's family, and all of patient's and family's questions were answered to their expressed satisfaction.  -- Marilynne Drivers Rosana Hoes, MD, [place]: [facility] - Partnering for exceptional care. Office: [phone]

## 2018-11-27 NOTE — Patient Instructions (Addendum)
Patient is to return to the office as needed.

## 2018-11-28 ENCOUNTER — Telehealth: Payer: Self-pay | Admitting: General Surgery

## 2018-11-28 ENCOUNTER — Telehealth: Payer: Self-pay

## 2018-11-28 NOTE — Telephone Encounter (Signed)
Return to work note completed and patient has been notified.

## 2018-11-28 NOTE — Telephone Encounter (Signed)
Work note has been completed.

## 2018-11-28 NOTE — Telephone Encounter (Signed)
Patient has called this morning and is requesting a back to work note. He had surgery on 11/17/18-umbilical hernia repair by Dr Bary Castilla. Patient had a post op appointment with Dr Rosana Hoes on 11/27/18-patient doing well.   Patient would like to return to work on 12/04/18. He is no longer taking any pain medication. 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   Patient would like to pick this up on 12/01/18.  Please call patient once completed - [phone].

## 2018-11-29 ENCOUNTER — Encounter: Payer: Self-pay | Admitting: Surgery

## 2018-12-01 ENCOUNTER — Encounter: Payer: Self-pay | Admitting: *Deleted

## 2018-12-02 ENCOUNTER — Encounter: Payer: BLUE CROSS/BLUE SHIELD | Admitting: Surgery

## 2019-01-11 ENCOUNTER — Other Ambulatory Visit: Payer: Self-pay | Admitting: Internal Medicine

## 2019-01-20 DIAGNOSIS — E559 Vitamin D deficiency, unspecified: Secondary | ICD-10-CM

## 2019-01-20 DIAGNOSIS — Z0000 Encounter for general adult medical examination without abnormal findings: Secondary | ICD-10-CM

## 2019-01-20 DIAGNOSIS — E7800 Pure hypercholesterolemia, unspecified: Secondary | ICD-10-CM

## 2019-01-20 NOTE — Telephone Encounter (Signed)
Pt is scheduled for a physical on 01/30/2019. I ordered a TSH, CMP, and lipid. Is there anything else that needs to be ordered?

## 2019-01-27 ENCOUNTER — Other Ambulatory Visit (INDEPENDENT_AMBULATORY_CARE_PROVIDER_SITE_OTHER): Payer: BLUE CROSS/BLUE SHIELD

## 2019-01-27 DIAGNOSIS — Z0000 Encounter for general adult medical examination without abnormal findings: Secondary | ICD-10-CM

## 2019-01-27 DIAGNOSIS — E7800 Pure hypercholesterolemia, unspecified: Secondary | ICD-10-CM | POA: Diagnosis not present

## 2019-01-27 LAB — LIPID PANEL
Cholesterol: 118 mg/dL (ref 0–200)
HDL: 50.8 mg/dL (ref >39.00)
LDL Cholesterol: 56 mg/dL (ref 0–99)
NonHDL: 67.38
Total CHOL/HDL Ratio: 2
Triglycerides: 55 mg/dL (ref 0.0–149.0)
VLDL: 11 mg/dL (ref 0.0–40.0)

## 2019-01-27 LAB — COMPREHENSIVE METABOLIC PANEL
ALT: 17 U/L (ref 0–53)
AST: 19 U/L (ref 0–37)
Albumin: 4.4 g/dL (ref 3.5–5.2)
Alkaline Phosphatase: 61 U/L (ref 39–117)
BUN: 19 mg/dL (ref 6–23)
CO2: 25 mEq/L (ref 19–32)
Calcium: 9.1 mg/dL (ref 8.4–10.5)
Chloride: 106 mEq/L (ref 96–112)
Creatinine, Ser: 0.86 mg/dL (ref 0.40–1.50)
GFR: 89.07 mL/min (ref >60.00)
Glucose, Bld: 96 mg/dL (ref 70–99)
Potassium: 4.1 mEq/L (ref 3.5–5.1)
Sodium: 137 mEq/L (ref 135–145)
Total Bilirubin: 0.4 mg/dL (ref 0.2–1.2)
Total Protein: 6.8 g/dL (ref 6.0–8.3)

## 2019-01-27 LAB — TSH: TSH: 2.27 u[IU]/mL (ref 0.35–4.50)

## 2019-01-30 ENCOUNTER — Encounter: Payer: Self-pay | Admitting: Internal Medicine

## 2019-01-30 ENCOUNTER — Ambulatory Visit (INDEPENDENT_AMBULATORY_CARE_PROVIDER_SITE_OTHER): Payer: BLUE CROSS/BLUE SHIELD | Admitting: Internal Medicine

## 2019-01-30 VITALS — BP 116/70 | HR 70 | Temp 98.5°F | Resp 15 | Ht 69.0 in | Wt 183.4 lb

## 2019-01-30 DIAGNOSIS — Z8719 Personal history of other diseases of the digestive system: Secondary | ICD-10-CM | POA: Insufficient documentation

## 2019-01-30 DIAGNOSIS — C61 Malignant neoplasm of prostate: Secondary | ICD-10-CM

## 2019-01-30 DIAGNOSIS — E782 Mixed hyperlipidemia: Secondary | ICD-10-CM

## 2019-01-30 DIAGNOSIS — Z9889 Other specified postprocedural states: Secondary | ICD-10-CM

## 2019-01-30 DIAGNOSIS — Z0000 Encounter for general adult medical examination without abnormal findings: Secondary | ICD-10-CM | POA: Diagnosis not present

## 2019-01-30 LAB — PSA: PSA: 3.2 ng/mL (ref <=4.0)

## 2019-01-30 MED ORDER — ZOSTER VAC RECOMB ADJUVANTED 50 MCG/0.5ML IM SUSR: 0.5000 mL | Freq: Once | INTRAMUSCULAR | 1 refills | Status: AC

## 2019-01-30 NOTE — Patient Instructions (Signed)
Your cholesterol is excellent!  Keep doing what you are doing   We are repeating your PSA  Today per Urology's advice.    The ShingRx vaccine is now available in local pharmacies and is much more protective than the old one  Zostavax  (it is about 97%  Effective in preventing shingles). .   It is therefore ADVISED for all interested adults over 50 to prevent shingles so I have printed you a prescription for it.  (it requires a 2nd dose 2 to 6 months after the first one) .  It will cause you to have flu  like symptoms for 2 days, so wait until flu season is over  Same advice for the Pneumonia vaccine.  (Pre treat yourself with ibuprofen and tylenol)   Health Maintenance After Age 66 After age 11, you are at a higher risk for certain long-term diseases and infections as well as injuries from falls. Falls are a major cause of broken bones and head injuries in people who are older than age 1. Getting regular preventive care can help to keep you healthy and well. Preventive care includes getting regular testing and making lifestyle changes as recommended by your health care provider. Talk with your health care provider about:  Which screenings and tests you should have. A screening is a test that checks for a disease when you have no symptoms.  A diet and exercise plan that is right for you. What should I know about screenings and tests to prevent falls? Screening and testing are the best ways to find a health problem early. Early diagnosis and treatment give you the best chance of managing medical conditions that are common after age 44. Certain conditions and lifestyle choices may make you more likely to have a fall. Your health care provider may recommend:  Regular vision checks. Poor vision and conditions such as cataracts can make you more likely to have a fall. If you wear glasses, make sure to get your prescription updated if your vision changes.  Medicine review. Work with your health care  provider to regularly review all of the medicines you are taking, including over-the-counter medicines. Ask your health care provider about any side effects that may make you more likely to have a fall. Tell your health care provider if any medicines that you take make you feel dizzy or sleepy.  Osteoporosis screening. Osteoporosis is a condition that causes the bones to get weaker. This can make the bones weak and cause them to break more easily.  Blood pressure screening. Blood pressure changes and medicines to control blood pressure can make you feel dizzy.  Strength and balance checks. Your health care provider may recommend certain tests to check your strength and balance while standing, walking, or changing positions.  Foot health exam. Foot pain and numbness, as well as not wearing proper footwear, can make you more likely to have a fall.  Depression screening. You may be more likely to have a fall if you have a fear of falling, feel emotionally low, or feel unable to do activities that you used to do.  Alcohol use screening. Using too much alcohol can affect your balance and may make you more likely to have a fall. What actions can I take to lower my risk of falls? General instructions  Talk with your health care provider about your risks for falling. Tell your health care provider if: ? You fall. Be sure to tell your health care provider about all falls,  even ones that seem minor. ? You feel dizzy, sleepy, or off-balance.  Take over-the-counter and prescription medicines only as told by your health care provider. These include any supplements.  Eat a healthy diet and maintain a healthy weight. A healthy diet includes low-fat dairy products, low-fat (lean) meats, and fiber from whole grains, beans, and lots of fruits and vegetables. Home safety  Remove any tripping hazards, such as rugs, cords, and clutter.  Install safety equipment such as grab bars in bathrooms and safety rails  on stairs.  Keep rooms and walkways well-lit. Activity   Follow a regular exercise program to stay fit. This will help you maintain your balance. Ask your health care provider what types of exercise are appropriate for you.  If you need a cane or walker, use it as recommended by your health care provider.  Wear supportive shoes that have nonskid soles. Lifestyle  Do not drink alcohol if your health care provider tells you not to drink.  If you drink alcohol, limit how much you have: ? 0-1 drink a day for women. ? 0-2 drinks a day for men.  Be aware of how much alcohol is in your drink. In the [place] one drink equals one typical bottle of beer (12 oz), one-half glass of wine (5 oz), or one shot of hard liquor (1 oz).  Do not use any products that contain nicotine or tobacco, such as cigarettes and e-cigarettes. If you need help quitting, ask your health care provider. Summary  Having a healthy lifestyle and getting preventive care can help to protect your health and wellness after age 109.  Screening and testing are the best way to find a health problem early and help you avoid having a fall. Early diagnosis and treatment give you the best chance for managing medical conditions that are more common for people who are older than age 60.  Falls are a major cause of broken bones and head injuries in people who are older than age 81. Take precautions to prevent a fall at home.  Work with your health care provider to learn what changes you can make to improve your health and wellness and to prevent falls. This information is not intended to replace advice given to you by your health care provider. Make sure you discuss any questions you have with your health care provider. Document Released: 10/02/2017 Document Revised: 10/02/2017 Document Reviewed: 10/02/2017 Elsevier Interactive Patient Education  2019 [employer].

## 2019-01-31 NOTE — Assessment & Plan Note (Signed)

## 2019-01-31 NOTE — Assessment & Plan Note (Signed)
LDL and triglycerides are at goal.   He has no side effects from simvastatin   Lab Results  Component Value Date   CHOL 118 01/27/2019   HDL 50.80 01/27/2019   LDLCALC 56 01/27/2019   LDLDIRECT 79.0 12/30/2015   TRIG 55.0 01/27/2019   CHOLHDL 2 01/27/2019   .lastlliv

## 2019-01-31 NOTE — Progress Notes (Signed)
[name], male    DOB: 1951-03-31  Age: 68 y.o. MRN: [id number]  The patient is here for annual preventive examination and management of other chronic and acute problems.   The risk factors are reflected in the social history.  The roster of all physicians providing medical care to patient - is listed in the Snapshot section of the chart.  Activities of daily living:  The patient is 100% independent in all ADLs: dressing, toileting, feeding as well as independent mobility  Home safety : The patient has smoke detectors in the home. They wear seatbelts.  There are no firearms at home. There is no violence in the home.   There is no risks for hepatitis, STDs or HIV. There is no   history of blood transfusion. They have no travel history to infectious disease endemic areas of the world.  The patient has seen their dentist in the last six month. They have seen their eye doctor in the last year. They admit to slight hearing difficulty with regard to whispered voices and some television programs.  They have deferred audiologic testing in the last year.  They do not  have excessive sun exposure. Discussed the need for sun protection: hats, long sleeves and use of sunscreen if there is significant sun exposure.   Diet: the importance of a healthy diet is discussed. They do have a healthy diet.  The benefits of regular aerobic exercise were discussed. he exercises 4  times per week ,  60 minutes.   Depression screen: there are no signs or vegative symptoms of depression- irritability, change in appetite, anhedonia, sadness/tearfullness.  Cognitive assessment: the patient manages all their financial and personal affairs and is actively engaged. They could relate day,date,year and events; recalled 2/3 objects at 3 minutes; performed clock-face test normally.  The following portions of the patient's history were reviewed and updated as appropriate: allergies, current medications, past  family history, past medical history,  past surgical history, past social history  and problem list.  Visual acuity was not assessed per patient preference since she has regular follow up with her ophthalmologist. Hearing and body mass index were assessed and reviewed.   During the course of the visit the patient was educated and counseled about appropriate screening and preventive services including : fall prevention , diabetes screening, nutrition counseling, colorectal cancer screening, and recommended immunizations.    CC: The primary encounter diagnosis was Prostate cancer ([place]). Diagnoses of Hx of umbilical hernia repair, Visit for preventive health examination, and Mixed hyperlipidemia were also pertinent to this visit.  History Edward Tran has a past medical history of Hyperlipidemia, Medical history non-contributory, Prostate cancer ([place]), and Umbilical hernia without obstruction and without gangrene (01/20/2017).   He has a past surgical history that includes Tonsillectomy and adenoidectomy; Colonoscopy (1950); and Umbilical hernia repair (N/A, 11/17/2018).   His family history includes COPD in his mother and sister; Cancer in his maternal grandmother and sister; Early death in his father; Stroke (age of onset: 73) in his mother; Stroke (age of onset: 32) in his maternal grandmother.He reports that he has never smoked. He has never used smokeless tobacco. He reports current alcohol use of about 4.0 standard drinks of alcohol per week. He reports that he does not use drugs.  Outpatient Medications Prior to Visit  Medication Sig Dispense Refill  . cholecalciferol (VITAMIN D) 25 MCG (1000 UT) tablet Take 1,000 Units by mouth daily.     Marland Kitchen loratadine (CLARITIN) 10 MG  tablet TAKE 1 TABLET BY MOUTH EVERY DAY FOR ALLERGIES (OTC NOT COVERED) 30 tablet 5  . Omega-3 Fatty Acids (FISH OIL) 1200 MG CAPS Take 2,400 mg by mouth daily.     . sildenafil (REVATIO) 20 MG tablet Take 1 tablet (20 mg total) by  mouth 3 (three) times daily. 30 tablet 0  . simvastatin (ZOCOR) 20 MG tablet TAKE 1 TABLET BY MOUTH EVERYDAY AT BEDTIME 90 tablet 1  . tamsulosin (FLOMAX) 0.4 MG CAPS capsule Take 1 capsule (0.4 mg total) by mouth daily after breakfast. 30 capsule 11  . HYDROcodone-acetaminophen (NORCO/VICODIN) 5-325 MG tablet Take 1 tablet by mouth every 4 (four) hours as needed for moderate pain. (Patient not taking: Reported on 01/30/2019) 15 tablet 0   No facility-administered medications prior to visit.     Review of Systems  Patient denies headache, fevers, malaise, unintentional weight loss, skin rash, eye pain, sinus congestion and sinus pain, sore throat, dysphagia,  hemoptysis , cough, dyspnea, wheezing, chest pain, palpitations, orthopnea, edema, abdominal pain, nausea, melena, diarrhea, constipation, flank pain, dysuria, hematuria, urinary  Frequency, nocturia, numbness, tingling, seizures,  Focal weakness, Loss of consciousness,  Tremor, insomnia, depression, anxiety, and suicidal ideation.      Objective:  BP 116/70 (BP Location: Left Arm, Patient Position: Sitting, Cuff Size: Normal)   Pulse 70   Temp 98.5 F (36.9 C) (Oral)   Resp 15   Ht 5\' 9"  (1.753 m)   Wt 183 lb 6.4 oz (83.2 kg)   SpO2 97%   BMI 27.08 kg/m   Physical Exam   General appearance: alert, cooperative and appears stated age Ears: normal TM's and external ear canals both ears Throat: lips, mucosa, and tongue normal; teeth and gums normal Neck: no adenopathy, no carotid bruit, supple, symmetrical, trachea midline and thyroid not enlarged, symmetric, no tenderness/mass/nodules Back: symmetric, no curvature. ROM normal. No CVA tenderness. Lungs: clear to auscultation bilaterally Heart: regular rate and rhythm, S1, S2 normal, no murmur, click, rub or gallop Abdomen: soft, non-tender; bowel sounds normal; no masses,  no organomegaly Pulses: 2+ and symmetric Skin: Skin color, texture, turgor normal. No rashes or  lesions Lymph nodes: Cervical, supraclavicular, and axillary nodes normal.    Assessment & Plan:   Problem List Items Addressed This Visit    Visit for preventive health examination    age appropriate education and counseling updated, referrals for preventative services and immunizations addressed, dietary and smoking counseling addressed, most recent labs reviewed.  I have personally reviewed and have noted:  1) the patient's medical and social history 2) The pt's use of alcohol, tobacco, and illicit drugs 3) The patient's current medications and supplements 4) Functional ability including ADL's, fall risk, home safety risk, hearing and visual impairment 5) Diet and physical activities 6) Evidence for depression or mood disorder 7) The patient's height, weight, and BMI have been recorded in the chart  I have made referrals, and provided counseling and education based on review of the above      Prostate cancer ([place]) - Primary    Active surveillance given low Gleason score.  6 month PSA is due  Lab Results  Component Value Date   PSA 3.2 01/30/2019   PSA 3.5 08/25/2018   PSA 3.6 01/28/2018         Relevant Orders   PSA (Completed)   Hyperlipidemia    LDL and triglycerides are at goal.   He has no side effects from simvastatin   Lab Results  Component Value Date   CHOL 118 01/27/2019   HDL 50.80 01/27/2019   LDLCALC 56 01/27/2019   LDLDIRECT 79.0 12/30/2015   TRIG 55.0 01/27/2019   CHOLHDL 2 01/27/2019   .lastlliv       Hx of umbilical hernia repair      I have discontinued Edward Tran's HYDROcodone-acetaminophen. I am also having him start on Zoster Vaccine Adjuvanted. Additionally, I am having him maintain his Fish Oil, cholecalciferol, tamsulosin, sildenafil, loratadine, and simvastatin.  Meds ordered this encounter  Medications  . Zoster Vaccine Adjuvanted [facility]) injection    Sig: Inject 0.5 mLs into the muscle once for 1 dose.    Dispense:   1 each    Refill:  1    Medications Discontinued During This Encounter  Medication Reason  . HYDROcodone-acetaminophen (NORCO/VICODIN) 5-325 MG tablet Patient has not taken in last 30 days    Follow-up: No follow-ups on file.   Crecencio Mc, MD

## 2019-01-31 NOTE — Assessment & Plan Note (Signed)
Active surveillance given low Gleason score.  6 month PSA is due  Lab Results  Component Value Date   PSA 3.2 01/30/2019   PSA 3.5 08/25/2018   PSA 3.6 01/28/2018

## 2019-01-31 NOTE — Progress Notes (Incomplete)
[name], male    DOB: [birth year]  Age: 68 y.o. MRN: [id number]  The patient is here for annual preventive examination and management of other chronic and acute problems.   The risk factors are reflected in the social history.  The roster of all physicians providing medical care to patient - is listed in the Snapshot section of the chart.  Activities of daily living:  The patient is 100% independent in all ADLs: dressing, toileting, feeding as well as independent mobility  Home safety : The patient has smoke detectors in the home. They wear seatbelts.  There are no firearms at home. There is no violence in the home.   There is no risks for hepatitis, STDs or HIV. There is no   history of blood transfusion. They have no travel history to infectious disease endemic areas of the world.  The patient has seen their dentist in the last six month. They have seen their eye doctor in the last year. They admit to slight hearing difficulty with regard to whispered voices and some television programs.  They have deferred audiologic testing in the last year.  They do not  have excessive sun exposure. Discussed the need for sun protection: hats, long sleeves and use of sunscreen if there is significant sun exposure.   Diet: the importance of a healthy diet is discussed. They do have a healthy diet.  The benefits of regular aerobic exercise were discussed. he exercises 4  times per week ,  60 minutes.   Depression screen: there are no signs or vegative symptoms of depression- irritability, change in appetite, anhedonia, sadness/tearfullness.  Cognitive assessment: the patient manages all their financial and personal affairs and is actively engaged. They could relate day,date,year and events; recalled 2/3 objects at 3 minutes; performed clock-face test normally.  The following portions of the patient's history were reviewed and updated as appropriate: allergies, current medications, past  family history, past medical history,  past surgical history, past social history  and problem list.  Visual acuity was not assessed per patient preference since she has regular follow up with her ophthalmologist. Hearing and body mass index were assessed and reviewed.   During the course of the visit the patient was educated and counseled about appropriate screening and preventive services including : fall prevention , diabetes screening, nutrition counseling, colorectal cancer screening, and recommended immunizations.    CC: The primary encounter diagnosis was Prostate cancer ([place]). Diagnoses of Hx of umbilical hernia repair, Visit for preventive health examination, and Mixed hyperlipidemia were also pertinent to this visit.  History Edward Tran has a past medical history of Hyperlipidemia, Medical history non-contributory, Prostate cancer ([place]), and Umbilical hernia without obstruction and without gangrene (01/20/2017).   He has a past surgical history that includes Tonsillectomy and adenoidectomy; Colonoscopy (8841); and Umbilical hernia repair (N/A, 11/17/2018).   His family history includes COPD in his mother and sister; Cancer in his maternal grandmother and sister; Early death in his father; Stroke (age of onset: 13) in his mother; Stroke (age of onset: 13) in his maternal grandmother.He reports that he has never smoked. He has never used smokeless tobacco. He reports current alcohol use of about 4.0 standard drinks of alcohol per week. He reports that he does not use drugs.  Outpatient Medications Prior to Visit  Medication Sig Dispense Refill  . cholecalciferol (VITAMIN D) 25 MCG (1000 UT) tablet Take 1,000 Units by mouth daily.     Marland Kitchen loratadine (CLARITIN) 10 MG  tablet TAKE 1 TABLET BY MOUTH EVERY DAY FOR ALLERGIES (OTC NOT COVERED) 30 tablet 5  . Omega-3 Fatty Acids (FISH OIL) 1200 MG CAPS Take 2,400 mg by mouth daily.     . sildenafil (REVATIO) 20 MG tablet Take 1 tablet (20 mg total) by  mouth 3 (three) times daily. 30 tablet 0  . simvastatin (ZOCOR) 20 MG tablet TAKE 1 TABLET BY MOUTH EVERYDAY AT BEDTIME 90 tablet 1  . tamsulosin (FLOMAX) 0.4 MG CAPS capsule Take 1 capsule (0.4 mg total) by mouth daily after breakfast. 30 capsule 11  . HYDROcodone-acetaminophen (NORCO/VICODIN) 5-325 MG tablet Take 1 tablet by mouth every 4 (four) hours as needed for moderate pain. (Patient not taking: Reported on 01/30/2019) 15 tablet 0   No facility-administered medications prior to visit.     Review of Systems  Patient denies headache, fevers, malaise, unintentional weight loss, skin rash, eye pain, sinus congestion and sinus pain, sore throat, dysphagia,  hemoptysis , cough, dyspnea, wheezing, chest pain, palpitations, orthopnea, edema, abdominal pain, nausea, melena, diarrhea, constipation, flank pain, dysuria, hematuria, urinary  Frequency, nocturia, numbness, tingling, seizures,  Focal weakness, Loss of consciousness,  Tremor, insomnia, depression, anxiety, and suicidal ideation.      Objective:  BP 116/70 (BP Location: Left Arm, Patient Position: Sitting, Cuff Size: Normal)   Pulse 70   Temp 98.5 F (36.9 C) (Oral)   Resp 15   Ht 5\' 9"  (1.753 m)   Wt 183 lb 6.4 oz (83.2 kg)   SpO2 97%   BMI 27.08 kg/m   Physical Exam   General appearance: alert, cooperative and appears stated age Ears: normal TM's and external ear canals both ears Throat: lips, mucosa, and tongue normal; teeth and gums normal Neck: no adenopathy, no carotid bruit, supple, symmetrical, trachea midline and thyroid not enlarged, symmetric, no tenderness/mass/nodules Back: symmetric, no curvature. ROM normal. No CVA tenderness. Lungs: clear to auscultation bilaterally Heart: regular rate and rhythm, S1, S2 normal, no murmur, click, rub or gallop Abdomen: soft, non-tender; bowel sounds normal; no masses,  no organomegaly Pulses: 2+ and symmetric Skin: Skin color, texture, turgor normal. No rashes or  lesions Lymph nodes: Cervical, supraclavicular, and axillary nodes normal.    Assessment & Plan:   Problem List Items Addressed This Visit    Visit for preventive health examination    age appropriate education and counseling updated, referrals for preventative services and immunizations addressed, dietary and smoking counseling addressed, most recent labs reviewed.  I have personally reviewed and have noted:  1) the patient's medical and social history 2) The pt's use of alcohol, tobacco, and illicit drugs 3) The patient's current medications and supplements 4) Functional ability including ADL's, fall risk, home safety risk, hearing and visual impairment 5) Diet and physical activities 6) Evidence for depression or mood disorder 7) The patient's height, weight, and BMI have been recorded in the chart  I have made referrals, and provided counseling and education based on review of the above      Prostate cancer ([place]) - Primary    Active surveillance given low Gleason score.  6 month PSA is due  Lab Results  Component Value Date   PSA 3.2 01/30/2019   PSA 3.5 08/25/2018   PSA 3.6 01/28/2018         Relevant Orders   PSA (Completed)   Hyperlipidemia    LDL and triglycerides are at goal.   He has no side effects from simvastatin   Lab Results  Component Value Date   CHOL 118 01/27/2019   HDL 50.80 01/27/2019   LDLCALC 56 01/27/2019   LDLDIRECT 79.0 12/30/2015   TRIG 55.0 01/27/2019   CHOLHDL 2 01/27/2019   .lastlliv       Hx of umbilical hernia repair      I have discontinued Blanchard C. Burkemper's HYDROcodone-acetaminophen. I am also having him start on Zoster Vaccine Adjuvanted. Additionally, I am having him maintain his Fish Oil, cholecalciferol, tamsulosin, sildenafil, loratadine, and simvastatin.  Meds ordered this encounter  Medications  . Zoster Vaccine Adjuvanted [facility]) injection    Sig: Inject 0.5 mLs into the muscle once for 1 dose.    Dispense:   1 each    Refill:  1    Medications Discontinued During This Encounter  Medication Reason  . HYDROcodone-acetaminophen (NORCO/VICODIN) 5-325 MG tablet Patient has not taken in last 30 days    Follow-up: No follow-ups on file.   Crecencio Mc, MD

## 2019-02-18 ENCOUNTER — Ambulatory Visit: Payer: BLUE CROSS/BLUE SHIELD | Admitting: Urology

## 2019-04-29 ENCOUNTER — Telehealth: Payer: Self-pay | Admitting: *Deleted

## 2019-04-29 NOTE — Telephone Encounter (Signed)
Copied from [place] [id number]. Topic: General - Other >> Apr 29, 2019  9:54 AM Carolyn Stare wrote:  Pt would like a call abck concerning pneumonia and shingles vaccine

## 2019-04-30 NOTE — Telephone Encounter (Signed)
Yes  He is due for Prevnar but I would wait on the shingles vaccine until AFTER THE EPIDEMIC to get them because it causes similar symptoms .    You can tell all   inquiries about the shingles vaccines this response./advice

## 2019-04-30 NOTE — Telephone Encounter (Signed)
Is patient due for either one of these vaccines?

## 2019-05-03 ENCOUNTER — Other Ambulatory Visit: Payer: Self-pay

## 2019-05-04 NOTE — Telephone Encounter (Signed)
Spoke with pt and he has decided that he is just going to wait awhile before having either of these vaccines done. Pt also wanted to let you know that he is going to postpone his colonoscopy for 6 months or so because of the pandemic and they are wanting him to be tested for Covid-19 before having the procedure and he just doesn't feel comfortable with that right now.

## 2019-05-05 ENCOUNTER — Other Ambulatory Visit: Payer: Self-pay | Admitting: Internal Medicine

## 2019-05-05 MED ORDER — SILDENAFIL CITRATE 20 MG PO TABS: 20.0000 mg | ORAL_TABLET | Freq: Three times a day (TID) | ORAL | 0 refills | Status: DC

## 2019-07-10 ENCOUNTER — Other Ambulatory Visit: Payer: Self-pay | Admitting: Internal Medicine

## 2019-09-09 ENCOUNTER — Encounter: Payer: Self-pay | Admitting: Internal Medicine

## 2019-09-19 ENCOUNTER — Other Ambulatory Visit: Payer: Self-pay | Admitting: Urology

## 2019-11-13 ENCOUNTER — Other Ambulatory Visit: Payer: Self-pay | Admitting: Internal Medicine

## 2019-11-29 ENCOUNTER — Other Ambulatory Visit: Payer: Self-pay | Admitting: Internal Medicine

## 2019-12-12 ENCOUNTER — Other Ambulatory Visit: Payer: Self-pay | Admitting: Urology

## 2019-12-14 ENCOUNTER — Telehealth: Payer: Self-pay | Admitting: Urology

## 2019-12-14 DIAGNOSIS — C61 Malignant neoplasm of prostate: Secondary | ICD-10-CM

## 2019-12-14 MED ORDER — TAMSULOSIN HCL 0.4 MG PO CAPS: 0.4000 mg | ORAL_CAPSULE | Freq: Every day | ORAL | 0 refills | Status: DC

## 2019-12-14 NOTE — Telephone Encounter (Signed)
Pt will run out of Tamsulosin before his next appt (02/19/20) and would like to get a 2 month supply sent in to [facility] on [address].

## 2020-01-01 ENCOUNTER — Ambulatory Visit: Payer: BLUE CROSS/BLUE SHIELD

## 2020-01-05 ENCOUNTER — Other Ambulatory Visit: Payer: Self-pay | Admitting: Internal Medicine

## 2020-01-07 ENCOUNTER — Ambulatory Visit: Payer: BC Managed Care – PPO | Attending: Internal Medicine

## 2020-01-07 DIAGNOSIS — Z23 Encounter for immunization: Secondary | ICD-10-CM | POA: Insufficient documentation

## 2020-01-07 NOTE — Progress Notes (Signed)
   Covid-19 Vaccination Clinic  Name:  ALEXIS ABNEY    MRN: [id number] DOB: [birth year]  01/07/2020  Mr. Stockbridge was observed post Covid-19 immunization for 15 minutes without incidence. He was provided with Vaccine Information Sheet and instruction to access the V-Safe system.   Mr. Wenzinger was instructed to call 911 with any severe reactions post vaccine: Marland Kitchen Difficulty breathing  . Swelling of your face and throat  . A fast heartbeat  . A bad rash all over your body  . Dizziness and weakness    Immunizations Administered    Name Date Dose VIS Date Route   Pfizer COVID-19 Vaccine 01/07/2020  8:23 AM 0.3 mL 11/13/2019 Intramuscular   Manufacturer: [place]   Lot: [id number]   [place]: [id number]

## 2020-01-22 ENCOUNTER — Ambulatory Visit: Payer: BLUE CROSS/BLUE SHIELD

## 2020-01-25 ENCOUNTER — Telehealth: Payer: Self-pay | Admitting: Internal Medicine

## 2020-01-25 DIAGNOSIS — Z125 Encounter for screening for malignant neoplasm of prostate: Secondary | ICD-10-CM

## 2020-01-25 DIAGNOSIS — R7301 Impaired fasting glucose: Secondary | ICD-10-CM

## 2020-01-25 DIAGNOSIS — R5383 Other fatigue: Secondary | ICD-10-CM

## 2020-01-25 DIAGNOSIS — E782 Mixed hyperlipidemia: Secondary | ICD-10-CM

## 2020-01-25 NOTE — Telephone Encounter (Signed)
Patient has cpe with [facility] on 02/01/2020 and would like to have labs done before appt. He would also like to have his PSA  Checked too.

## 2020-01-25 NOTE — Telephone Encounter (Signed)
Pt is coming in on Friday to have his lab work done. I have ordered PSA, lipid, CMP, TSH, CBC and A1c. Is there anything else that needs to be ordered?

## 2020-01-26 NOTE — Telephone Encounter (Signed)
Spoke with pt and moved his appts around. Pt is aware of appt date and time.

## 2020-01-26 NOTE — Telephone Encounter (Signed)
His insurance will not pay for the PSA before Monday Feb 28 th , so I recommend that he move his lab visit accordingly

## 2020-01-29 ENCOUNTER — Other Ambulatory Visit: Payer: BC Managed Care – PPO

## 2020-02-01 ENCOUNTER — Ambulatory Visit: Payer: BC Managed Care – PPO | Attending: Internal Medicine

## 2020-02-01 ENCOUNTER — Encounter: Payer: BLUE CROSS/BLUE SHIELD | Admitting: Internal Medicine

## 2020-02-01 DIAGNOSIS — Z23 Encounter for immunization: Secondary | ICD-10-CM | POA: Insufficient documentation

## 2020-02-01 NOTE — Progress Notes (Signed)
   Covid-19 Vaccination Clinic  Name:  Edward Tran    MRN: [id number] DOB: September 29, 1951  02/01/2020  Mr. Edward Tran was observed post Covid-19 immunization for 15 minutes without incidence. He was provided with Vaccine Information Sheet and instruction to access the V-Safe system.   Mr. Edward Tran was instructed to call 911 with any severe reactions post vaccine: Marland Kitchen Difficulty breathing  . Swelling of your face and throat  . A fast heartbeat  . A bad rash all over your body  . Dizziness and weakness    Immunizations Administered    Name Date Dose VIS Date Route   Pfizer COVID-19 Vaccine 02/01/2020  8:32 AM 0.3 mL 11/13/2019 Intramuscular   Manufacturer: [place]   Lot: [id number]   : [id number]

## 2020-02-04 ENCOUNTER — Other Ambulatory Visit: Payer: Self-pay

## 2020-02-05 ENCOUNTER — Other Ambulatory Visit (INDEPENDENT_AMBULATORY_CARE_PROVIDER_SITE_OTHER): Payer: BC Managed Care – PPO

## 2020-02-05 ENCOUNTER — Other Ambulatory Visit: Payer: Self-pay

## 2020-02-05 DIAGNOSIS — R5383 Other fatigue: Secondary | ICD-10-CM | POA: Diagnosis not present

## 2020-02-05 DIAGNOSIS — Z125 Encounter for screening for malignant neoplasm of prostate: Secondary | ICD-10-CM

## 2020-02-05 DIAGNOSIS — E782 Mixed hyperlipidemia: Secondary | ICD-10-CM | POA: Diagnosis not present

## 2020-02-05 DIAGNOSIS — R7301 Impaired fasting glucose: Secondary | ICD-10-CM

## 2020-02-05 LAB — COMPREHENSIVE METABOLIC PANEL
ALT: 20 U/L (ref 0–53)
AST: 29 U/L (ref 0–37)
Albumin: 4.1 g/dL (ref 3.5–5.2)
Alkaline Phosphatase: 73 U/L (ref 39–117)
BUN: 15 mg/dL (ref 6–23)
CO2: 27 mEq/L (ref 19–32)
Calcium: 9.2 mg/dL (ref 8.4–10.5)
Chloride: 105 mEq/L (ref 96–112)
Creatinine, Ser: 0.87 mg/dL (ref 0.40–1.50)
GFR: 87.62 mL/min (ref >60.00)
Glucose, Bld: 101 mg/dL — ABNORMAL HIGH (ref 70–99)
Potassium: 4.4 mEq/L (ref 3.5–5.1)
Sodium: 139 mEq/L (ref 135–145)
Total Bilirubin: 0.6 mg/dL (ref 0.2–1.2)
Total Protein: 6.3 g/dL (ref 6.0–8.3)

## 2020-02-05 LAB — CBC WITH DIFFERENTIAL/PLATELET
Basophils Absolute: 0 10*3/uL (ref 0.0–0.1)
Basophils Relative: 0.6 % (ref 0.0–3.0)
Eosinophils Absolute: 0.2 10*3/uL (ref 0.0–0.7)
Eosinophils Relative: 3.8 % (ref 0.0–5.0)
HCT: 44.2 % (ref 39.0–52.0)
Hemoglobin: 15 g/dL (ref 13.0–17.0)
Lymphocytes Relative: 16.3 % (ref 12.0–46.0)
Lymphs Abs: 0.8 10*3/uL (ref 0.7–4.0)
MCHC: 33.9 g/dL (ref 30.0–36.0)
MCV: 92.8 fl (ref 78.0–100.0)
Monocytes Absolute: 0.8 10*3/uL (ref 0.1–1.0)
Monocytes Relative: 16.1 % — ABNORMAL HIGH (ref 3.0–12.0)
Neutro Abs: 3.1 10*3/uL (ref 1.4–7.7)
Neutrophils Relative %: 63.2 % (ref 43.0–77.0)
Platelets: 188 10*3/uL (ref 150.0–400.0)
RBC: 4.77 Mil/uL (ref 4.22–5.81)
RDW: 12.4 % (ref 11.5–15.5)
WBC: 4.9 10*3/uL (ref 4.0–10.5)

## 2020-02-05 LAB — LIPID PANEL
Cholesterol: 134 mg/dL (ref 0–200)
HDL: 55.3 mg/dL (ref >39.00)
LDL Cholesterol: 66 mg/dL (ref 0–99)
NonHDL: 78.28
Total CHOL/HDL Ratio: 2
Triglycerides: 62 mg/dL (ref 0.0–149.0)
VLDL: 12.4 mg/dL (ref 0.0–40.0)

## 2020-02-05 LAB — HEMOGLOBIN A1C: Hgb A1c MFr Bld: 5.5 % (ref 4.6–6.5)

## 2020-02-05 LAB — PSA, MEDICARE: PSA: 4.24 ng/ml — ABNORMAL HIGH (ref 0.10–4.00)

## 2020-02-05 LAB — TSH: TSH: 4.17 u[IU]/mL (ref 0.35–4.50)

## 2020-02-08 ENCOUNTER — Other Ambulatory Visit: Payer: Self-pay

## 2020-02-08 ENCOUNTER — Ambulatory Visit (INDEPENDENT_AMBULATORY_CARE_PROVIDER_SITE_OTHER): Payer: BC Managed Care – PPO | Admitting: Internal Medicine

## 2020-02-08 ENCOUNTER — Encounter: Payer: Self-pay | Admitting: Internal Medicine

## 2020-02-08 VITALS — BP 138/76 | HR 70 | Temp 96.4°F | Resp 15 | Ht 69.0 in | Wt 179.4 lb

## 2020-02-08 DIAGNOSIS — R030 Elevated blood-pressure reading, without diagnosis of hypertension: Secondary | ICD-10-CM

## 2020-02-08 DIAGNOSIS — D126 Benign neoplasm of colon, unspecified: Secondary | ICD-10-CM

## 2020-02-08 DIAGNOSIS — Z1211 Encounter for screening for malignant neoplasm of colon: Secondary | ICD-10-CM

## 2020-02-08 DIAGNOSIS — Z0001 Encounter for general adult medical examination with abnormal findings: Secondary | ICD-10-CM | POA: Diagnosis not present

## 2020-02-08 DIAGNOSIS — C61 Malignant neoplasm of prostate: Secondary | ICD-10-CM

## 2020-02-08 DIAGNOSIS — Z0000 Encounter for general adult medical examination without abnormal findings: Secondary | ICD-10-CM

## 2020-02-08 MED ORDER — CIPROFLOXACIN HCL 500 MG PO TABS: 500.0000 mg | ORAL_TABLET | Freq: Two times a day (BID) | ORAL | 0 refills | Status: DC

## 2020-02-08 MED ORDER — CIPROFLOXACIN HCL 500 MG PO TABS: 250.0000 mg | ORAL_TABLET | Freq: Two times a day (BID) | ORAL | 0 refills | Status: DC

## 2020-02-08 NOTE — Progress Notes (Signed)
[name], male    DOB: 05-27-1951  Age: 69 y.o. MRN: [id number]  The patient is here for annual preventive examination and management of other chronic and acute problems.  This visit occurred during the SARS-CoV-2 public health emergency.  Safety protocols were in place, including screening questions prior to the visit, additional usage of staff PPE, and extensive cleaning of exam room while observing appropriate contact time as indicated for disinfecting solutions.      The risk factors are reflected in the social history.  The roster of all physicians providing medical care to patient - is listed in the Snapshot section of the chart.  Activities of daily living:  The patient is 100% independent in all ADLs: dressing, toileting, feeding as well as independent mobility  Home safety : The patient has smoke detectors in the home. They wear seatbelts.  There are no firearms at home. There is no violence in the home.   There is no risks for hepatitis, STDs or HIV. There is no   history of blood transfusion. They have no travel history to infectious disease endemic areas of the world.  The patient has seen their dentist in the last six month. They have seen their eye doctor in the last year. He denies  slight hearing difficulty with regard to whispered voices and some television programs.  They have deferred audiologic testing in the last year.  They do not  have excessive sun exposure. Discussed the need for sun protection: hats, long sleeves and use of sunscreen if there is significant sun exposure.   Diet: the importance of a healthy diet is discussed. They do have a healthy diet.  The benefits of regular aerobic exercise were discussed. She walks 4 times per week ,  20 minutes.   Depression screen: there are no signs or vegative symptoms of depression- irritability, change in appetite, anhedonia, sadness/tearfullness.   The following portions of the patient's history were  reviewed and updated as appropriate: allergies, current medications, past family history, past medical history,  past surgical history, past social history  and problem list.  Visual acuity was not assessed per patient preference since she has regular follow up with her ophthalmologist. Hearing and body mass index were assessed and reviewed.   During the course of the visit the patient was educated and counseled about appropriate screening and preventive services including : fall prevention , diabetes screening, nutrition counseling, colorectal cancer screening, and recommended immunizations.    CC: Diagnoses of Prostate cancer ([place]), Screening for colon cancer, Visit for preventive health examination, Adenomatous polyp of colon, unspecified part of colon, and Elevated blood pressure reading without diagnosis of hypertension were pertinent to this visit.   1) Elevated PSA: patient has a history of prostate CA diagnosed by biopsy in 2018, with normal PSAs over the last 2 years,  Elevated currently.  Has an appt with urology for follow up in 10 days. Not sure if biopsy is planned (review of [facility] noted from 2018 advised repeat biopsies every 1-2 years)    History Welch has a past medical history of Hyperlipidemia, Medical history non-contributory, Prostate cancer ([place]), and Umbilical hernia without obstruction and without gangrene (01/20/2017).   He has a past surgical history that includes Tonsillectomy and adenoidectomy; Colonoscopy ([id number]); and Umbilical hernia repair (N/A, 11/17/2018).   His family history includes COPD in his mother and sister; Cancer in his maternal grandmother and sister; Early death in his father; Stroke (age of onset: 58)  in his mother; Stroke (age of onset: 65) in his maternal grandmother.He reports that he has never smoked. He has never used smokeless tobacco. He reports current alcohol use of about 4.0 standard drinks of alcohol per week. He reports that he does not use  drugs.  Outpatient Medications Prior to Visit  Medication Sig Dispense Refill  . Ascorbic Acid (VITAMIN C) 1000 MG tablet Take 1,000 mg by mouth daily.     . cholecalciferol (VITAMIN D) 25 MCG (1000 UT) tablet Take 1,000 Units by mouth daily.     Marland Kitchen loratadine (CLARITIN) 10 MG tablet TAKE 1 TABLET BY MOUTH EVERY DAY FOR ALLERGIES (OTC NOT COVERED) 90 tablet 1  . Omega-3 Fatty Acids (FISH OIL) 1200 MG CAPS Take 2,400 mg by mouth daily.     . sildenafil (REVATIO) 20 MG tablet TAKE 1 TABLET BY MOUTH THREE TIMES A DAY 30 tablet 0  . simvastatin (ZOCOR) 20 MG tablet TAKE 1 TABLET BY MOUTH EVERYDAY AT BEDTIME 90 tablet 3  . tamsulosin (FLOMAX) 0.4 MG CAPS capsule Take 1 capsule (0.4 mg total) by mouth daily. 60 capsule 0  . Zinc 100 MG TABS Take 1 tablet by mouth daily.      No facility-administered medications prior to visit.    Review of Systems  Objective:  BP 138/76 (BP Location: Left Arm, Patient Position: Sitting, Cuff Size: Normal)   Pulse 70   Temp (!) 96.4 F (35.8 C) (Temporal)   Resp 15   Ht 5\' 9"  (1.753 m)   Wt 179 lb 6.4 oz (81.4 kg)   SpO2 97%   BMI 26.49 kg/m   Physical Exam    Assessment & Plan:   Problem List Items Addressed This Visit      Unprioritized   Screening for colon cancer    Last scope was 2015, hyperplastic polyps found,  With adenomatous polyps reportedly found in 2010.  Refer to Lucilla Lame       Relevant Orders   Ambulatory referral to Gastroenterology   Visit for preventive health examination    age appropriate education and counseling updated, referrals for preventative services and immunizations addressed, dietary and smoking counseling addressed, most recent labs reviewed.  I have personally reviewed and have noted:  1) the patient's medical and social history 2) The pt's use of alcohol, tobacco, and illicit drugs 3) The patient's current medications and supplements 4) Functional ability including ADL's, fall risk, home safety risk,  hearing and visual impairment 5) Diet and physical activities 6) Evidence for depression or mood disorder 7) The patient's height, weight, and BMI have been recorded in the chart  I have made referrals, and provided counseling and education based on review of the above      Adenomatous polyp of colon    Referral in process       Prostate cancer [facility])    Diagnosed by  bx June 2019,  OSA 4.0 at time of dagnosis.  Gleason score was  3 + 3= 6.  CT1AN0M0   Active surveillance was chosen and q 6 month DRE/PSA  And repeat biopsy in 2-3 years by local urology was recommended by secnod opinion of Dr. Darcus Austin at [facility].  Current PSA is elevated and was done in anticipation of uroloy appt March 19.  Will prescribe cipro x 14 days and have him follow up as planned with [facility]     Lab Results  Component Value Date   PSA 4.24 (H) 02/05/2020   PSA 3.2  01/30/2019   PSA 3.5 08/25/2018         Relevant Medications   ciprofloxacin (CIPRO) 500 MG tablet   Elevated blood pressure reading without diagnosis of hypertension    Outside readings reviewed.  He has no prior history of hypertension. He will check his blood pressure several times over the next 3-4 weeks and to submit readings for evaluation. Lytes and  Creatinine  Are normal.  Lab Results  Component Value Date   CREATININE 0.87 02/05/2020   Lab Results  Component Value Date   NA 139 02/05/2020   K 4.4 02/05/2020   CL 105 02/05/2020   CO2 27 02/05/2020   No results found for: LABMICR, MICROALBUR             I have changed Asiel C. Mcguirt's ciprofloxacin. I am also having him maintain his Fish Oil, cholecalciferol, loratadine, sildenafil, tamsulosin, simvastatin, vitamin C, and Zinc.  Meds ordered this encounter  Medications  . DISCONTD: ciprofloxacin (CIPRO) 500 MG tablet    Sig: Take 0.5 tablets (250 mg total) by mouth 2 (two) times daily.    Dispense:  14 tablet    Refill:  0  . ciprofloxacin (CIPRO) 500 MG tablet    Sig:  Take 1 tablet (500 mg total) by mouth 2 (two) times daily.    Dispense:  28 tablet    Refill:  0    Medications Discontinued During This Encounter  Medication Reason  . ciprofloxacin (CIPRO) 500 MG tablet Reorder    Follow-up: No follow-ups on file.   Crecencio Mc, MD

## 2020-02-08 NOTE — Patient Instructions (Addendum)
I am prescribing ciprofloxacin 500 mg twice daily for 2 weeks  Let Dr Bernardo Heater know that you are taking it. If he wants me to repeat the PSA in a month,  Let me know and I will do so  You can increase the sildenafil dose to 40 mg .  The cheap pills only come in 20 mg so just use 2   Referral for colonoscopy is in progress    Health Maintenance After Age 67 After age 77, you are at a higher risk for certain long-term diseases and infections as well as injuries from falls. Falls are a major cause of broken bones and head injuries in people who are older than age 32. Getting regular preventive care can help to keep you healthy and well. Preventive care includes getting regular testing and making lifestyle changes as recommended by your health care provider. Talk with your health care provider about:  Which screenings and tests you should have. A screening is a test that checks for a disease when you have no symptoms.  A diet and exercise plan that is right for you. What should I know about screenings and tests to prevent falls? Screening and testing are the best ways to find a health problem early. Early diagnosis and treatment give you the best chance of managing medical conditions that are common after age 69. Certain conditions and lifestyle choices may make you more likely to have a fall. Your health care provider may recommend:  Regular vision checks. Poor vision and conditions such as cataracts can make you more likely to have a fall. If you wear glasses, make sure to get your prescription updated if your vision changes.  Medicine review. Work with your health care provider to regularly review all of the medicines you are taking, including over-the-counter medicines. Ask your health care provider about any side effects that may make you more likely to have a fall. Tell your health care provider if any medicines that you take make you feel dizzy or sleepy.  Osteoporosis screening. Osteoporosis  is a condition that causes the bones to get weaker. This can make the bones weak and cause them to break more easily.  Blood pressure screening. Blood pressure changes and medicines to control blood pressure can make you feel dizzy.  Strength and balance checks. Your health care provider may recommend certain tests to check your strength and balance while standing, walking, or changing positions.  Foot health exam. Foot pain and numbness, as well as not wearing proper footwear, can make you more likely to have a fall.  Depression screening. You may be more likely to have a fall if you have a fear of falling, feel emotionally low, or feel unable to do activities that you used to do.  Alcohol use screening. Using too much alcohol can affect your balance and may make you more likely to have a fall. What actions can I take to lower my risk of falls? General instructions  Talk with your health care provider about your risks for falling. Tell your health care provider if: ? You fall. Be sure to tell your health care provider about all falls, even ones that seem minor. ? You feel dizzy, sleepy, or off-balance.  Take over-the-counter and prescription medicines only as told by your health care provider. These include any supplements.  Eat a healthy diet and maintain a healthy weight. A healthy diet includes low-fat dairy products, low-fat (lean) meats, and fiber from whole grains, beans, and lots  of fruits and vegetables. Home safety  Remove any tripping hazards, such as rugs, cords, and clutter.  Install safety equipment such as grab bars in bathrooms and safety rails on stairs.  Keep rooms and walkways well-lit. Activity   Follow a regular exercise program to stay fit. This will help you maintain your balance. Ask your health care provider what types of exercise are appropriate for you.  If you need a cane or walker, use it as recommended by your health care provider.  Wear supportive  shoes that have nonskid soles. Lifestyle  Do not drink alcohol if your health care provider tells you not to drink.  If you drink alcohol, limit how much you have: ? 0-1 drink a day for women. ? 0-2 drinks a day for men.  Be aware of how much alcohol is in your drink. In the [place] one drink equals one typical bottle of beer (12 oz), one-half glass of wine (5 oz), or one shot of hard liquor (1 oz).  Do not use any products that contain nicotine or tobacco, such as cigarettes and e-cigarettes. If you need help quitting, ask your health care provider. Summary  Having a healthy lifestyle and getting preventive care can help to protect your health and wellness after age 42.  Screening and testing are the best way to find a health problem early and help you avoid having a fall. Early diagnosis and treatment give you the best chance for managing medical conditions that are more common for people who are older than age 73.  Falls are a major cause of broken bones and head injuries in people who are older than age 77. Take precautions to prevent a fall at home.  Work with your health care provider to learn what changes you can make to improve your health and wellness and to prevent falls. This information is not intended to replace advice given to you by your health care provider. Make sure you discuss any questions you have with your health care provider. Document Revised: 03/12/2019 Document Reviewed: 10/02/2017 Elsevier Patient Education  2020 [employer].

## 2020-02-09 ENCOUNTER — Other Ambulatory Visit: Payer: Self-pay

## 2020-02-09 ENCOUNTER — Telehealth: Payer: Self-pay

## 2020-02-09 DIAGNOSIS — Z860100 Personal history of colon polyps, unspecified: Secondary | ICD-10-CM

## 2020-02-09 DIAGNOSIS — R030 Elevated blood-pressure reading, without diagnosis of hypertension: Secondary | ICD-10-CM | POA: Insufficient documentation

## 2020-02-09 DIAGNOSIS — Z1211 Encounter for screening for malignant neoplasm of colon: Secondary | ICD-10-CM

## 2020-02-09 NOTE — Assessment & Plan Note (Addendum)
Last scope was 2015, hyperplastic polyps found,  With adenomatous polyps reportedly found in 2010.  Refer to [employer]

## 2020-02-09 NOTE — Assessment & Plan Note (Signed)
Outside readings reviewed.  He has no prior history of hypertension. He will check his blood pressure several times over the next 3-4 weeks and to submit readings for evaluation. Lytes and  Creatinine  Are normal.  Lab Results  Component Value Date   CREATININE 0.87 02/05/2020   Lab Results  Component Value Date   NA 139 02/05/2020   K 4.4 02/05/2020   CL 105 02/05/2020   CO2 27 02/05/2020   No results found for: LABMICR, MICROALBUR

## 2020-02-09 NOTE — Assessment & Plan Note (Signed)
Diagnosed by  bx June 2019,  OSA 4.0 at time of dagnosis.  Gleason score was  3 + 3= 6.  CT1AN0M0   Active surveillance was chosen and q 6 month DRE/PSA  And repeat biopsy in 2-3 years by local urology was recommended by secnod opinion of Dr. Darcus Austin at [facility].  Current PSA is elevated and was done in anticipation of uroloy appt March 19.  Will prescribe cipro x 14 days and have him follow up as planned with [facility]     Lab Results  Component Value Date   PSA 4.24 (H) 02/05/2020   PSA 3.2 01/30/2019   PSA 3.5 08/25/2018

## 2020-02-09 NOTE — Assessment & Plan Note (Signed)
Referral in process

## 2020-02-09 NOTE — Telephone Encounter (Signed)
Gastroenterology Pre-Procedure Review  Request Date: Tuesday 03/01/20 Requesting Physician: Dr. Vicente Males  PATIENT REVIEW QUESTIONS: The patient responded to the following health history questions as indicated:    1. Are you having any GI issues? no 2. Do you have a personal history of Polyps? yes (about 5 years ago) 3. Do you have a family history of Colon Cancer or Polyps? no 4. Diabetes Mellitus? no 5. Joint replacements in the past 12 months?no 6. Major health problems in the past 3 months?Umbilical hernia surgery December 2019 7. Any artificial heart valves, MVP, or defibrillator?no    MEDICATIONS & ALLERGIES:    Patient reports the following regarding taking any anticoagulation/antiplatelet therapy:   Plavix, Coumadin, Eliquis, Xarelto, Lovenox, Pradaxa, Brilinta, or Effient? no Aspirin? no  Patient confirms/reports the following medications:  Current Outpatient Medications  Medication Sig Dispense Refill  . Ascorbic Acid (VITAMIN C) 1000 MG tablet Take 1,000 mg by mouth daily.     . cholecalciferol (VITAMIN D) 25 MCG (1000 UT) tablet Take 1,000 Units by mouth daily.     . ciprofloxacin (CIPRO) 500 MG tablet Take 1 tablet (500 mg total) by mouth 2 (two) times daily. 28 tablet 0  . loratadine (CLARITIN) 10 MG tablet TAKE 1 TABLET BY MOUTH EVERY DAY FOR ALLERGIES (OTC NOT COVERED) 90 tablet 1  . Omega-3 Fatty Acids (FISH OIL) 1200 MG CAPS Take 2,400 mg by mouth daily.     . sildenafil (REVATIO) 20 MG tablet TAKE 1 TABLET BY MOUTH THREE TIMES A DAY 30 tablet 0  . simvastatin (ZOCOR) 20 MG tablet TAKE 1 TABLET BY MOUTH EVERYDAY AT BEDTIME 90 tablet 3  . tamsulosin (FLOMAX) 0.4 MG CAPS capsule Take 1 capsule (0.4 mg total) by mouth daily. 60 capsule 0  . Zinc 100 MG TABS Take 1 tablet by mouth daily.      No current facility-administered medications for this visit.    Patient confirms/reports the following allergies:  No Known Allergies  No orders of the defined types were placed  in this encounter.   AUTHORIZATION INFORMATION Primary Insurance: 1D#: Group #:  Secondary Insurance: 1D#: Group #:  SCHEDULE INFORMATION: Date: Tuesday 03/01/20 Time: Location:[facility]

## 2020-02-09 NOTE — Assessment & Plan Note (Signed)

## 2020-02-10 ENCOUNTER — Telehealth: Payer: Self-pay

## 2020-02-10 NOTE — Telephone Encounter (Signed)
Patient contacted office to reschedule colonoscopy from 03/01/20 with Dr. Vicente Males.  He has been rescheduled to 03/28/20 with Dr. Vicente Males.  Pt advised of COVID test date 03/24/20.  Almyra Free in Endo has been informed of date change.  New instructions will be sent to patient, and referral updated.  Thanks,  [place], [place]

## 2020-02-11 ENCOUNTER — Other Ambulatory Visit: Payer: Self-pay | Admitting: Urology

## 2020-02-11 DIAGNOSIS — C61 Malignant neoplasm of prostate: Secondary | ICD-10-CM

## 2020-02-16 ENCOUNTER — Telehealth: Payer: Self-pay | Admitting: Gastroenterology

## 2020-02-16 NOTE — Telephone Encounter (Signed)
Pt left vm he had r/s his procedure from 02/26/20 to 03/28/20 he just received his package with instructions for 02/26/20 and would like to make sure he will receive an update package in the mail with correct dated of procedure

## 2020-02-17 NOTE — Telephone Encounter (Signed)
Returned patients call to inform him that I will prepare new colonoscopy instructions to reflect procedure date change, and he can locate them in his mychart.  Paper instructions have been prepared and will be mailed to him.  Thanks,  [place], [place]

## 2020-02-18 ENCOUNTER — Other Ambulatory Visit: Payer: Self-pay | Admitting: *Deleted

## 2020-02-18 ENCOUNTER — Other Ambulatory Visit: Payer: Self-pay | Admitting: Urology

## 2020-02-18 DIAGNOSIS — C61 Malignant neoplasm of prostate: Secondary | ICD-10-CM

## 2020-02-18 MED ORDER — TAMSULOSIN HCL 0.4 MG PO CAPS: 0.4000 mg | ORAL_CAPSULE | Freq: Every day | ORAL | 0 refills | Status: DC

## 2020-02-19 ENCOUNTER — Ambulatory Visit: Payer: Self-pay | Admitting: Urology

## 2020-02-20 ENCOUNTER — Other Ambulatory Visit: Payer: Self-pay | Admitting: Internal Medicine

## 2020-02-20 DIAGNOSIS — R972 Elevated prostate specific antigen [PSA]: Secondary | ICD-10-CM

## 2020-03-11 ENCOUNTER — Other Ambulatory Visit (INDEPENDENT_AMBULATORY_CARE_PROVIDER_SITE_OTHER): Payer: BC Managed Care – PPO

## 2020-03-11 ENCOUNTER — Other Ambulatory Visit: Payer: Self-pay

## 2020-03-11 DIAGNOSIS — R972 Elevated prostate specific antigen [PSA]: Secondary | ICD-10-CM | POA: Diagnosis not present

## 2020-03-11 LAB — PSA: PSA: 3.1 ng/mL (ref 0.10–4.00)

## 2020-03-13 ENCOUNTER — Other Ambulatory Visit: Payer: Self-pay | Admitting: Urology

## 2020-03-13 DIAGNOSIS — C61 Malignant neoplasm of prostate: Secondary | ICD-10-CM

## 2020-03-24 ENCOUNTER — Other Ambulatory Visit
Admission: RE | Admit: 2020-03-24 | Discharge: 2020-03-24 | Disposition: A | Discharge status: Home / Self Care | Payer: BC Managed Care – PPO | Source: Ambulatory Visit | Attending: Gastroenterology | Admitting: Gastroenterology

## 2020-03-24 ENCOUNTER — Other Ambulatory Visit: Payer: Self-pay

## 2020-03-24 ENCOUNTER — Ambulatory Visit (INDEPENDENT_AMBULATORY_CARE_PROVIDER_SITE_OTHER): Payer: BC Managed Care – PPO | Admitting: Urology

## 2020-03-24 VITALS — BP 143/80 | HR 77 | Ht 69.0 in | Wt 173.0 lb

## 2020-03-24 DIAGNOSIS — Z20822 Contact with and (suspected) exposure to covid-19: Secondary | ICD-10-CM | POA: Insufficient documentation

## 2020-03-24 DIAGNOSIS — C61 Malignant neoplasm of prostate: Secondary | ICD-10-CM | POA: Diagnosis not present

## 2020-03-24 DIAGNOSIS — Z01812 Encounter for preprocedural laboratory examination: Secondary | ICD-10-CM | POA: Insufficient documentation

## 2020-03-24 LAB — SARS CORONAVIRUS 2 (TAT 6-24 HRS): SARS Coronavirus 2: NEGATIVE

## 2020-03-24 NOTE — Progress Notes (Signed)
03/24/2020 8:49 AM   Edward Tran 08/13/1951 [id number]  Referring provider: Crecencio Mc, MD [place],  [place] [address]  Chief Complaint  Patient presents with  . Prostate Cancer    Urologic history: 1. cT1c adenocarcinoma prostate -Biopsy June 2019; PSA 4.0; volume 52 g -Left mid core with focal Gleason 3+3 at 5% -Elected active surveillance  2.  BPH with lower urinary tract symptoms -On tamsulosin   HPI: 69 yo male presents for follow-up of prostate cancer.  Last visit was September 2019.  -PSA checked 3/21 by Dr. Derrel Nip was elevated above baseline at 4.24.  Repeated 03/11/2020 and was 3.10 -Stable LUTS on tamsulosin -No dysuria or gross hematuria -Denies flank, abdominal or pelvic pain -Has not had a confirmatory biopsy.     PMH: Past Medical History:  Diagnosis Date  . Hyperlipidemia   . Medical history non-contributory   . Prostate cancer ([facility])    low score  . Umbilical hernia without obstruction and without gangrene 01/20/2017    Surgical History: Past Surgical History:  Procedure Laterality Date  . COLONOSCOPY  2016  . TONSILLECTOMY AND ADENOIDECTOMY    . UMBILICAL HERNIA REPAIR N/A 11/17/2018   Procedure: HERNIA REPAIR UMBILICAL ADULT;  Surgeon: Robert Bellow, MD;  Location: [facility] ORS;  Service: General;  Laterality: N/A;    Home Medications:  Allergies as of 03/24/2020   No Known Allergies     Medication List       Accurate as of March 24, 2020 11:59 PM. If you have any questions, ask your nurse or doctor.        STOP taking these medications   ciprofloxacin 500 MG tablet Commonly known as: CIPRO Stopped by: Abbie Sons, MD     TAKE these medications   cholecalciferol 25 MCG (1000 UNIT) tablet Commonly known as: VITAMIN D Take 1,000 Units by mouth daily.   Fish Oil 1200 MG Caps Take 2,400 mg by mouth daily.   loratadine 10 MG tablet Commonly known as: CLARITIN TAKE 1 TABLET BY MOUTH EVERY  DAY FOR ALLERGIES (OTC NOT COVERED)   sildenafil 20 MG tablet Commonly known as: REVATIO TAKE 1 TABLET BY MOUTH THREE TIMES A DAY   simvastatin 20 MG tablet Commonly known as: ZOCOR TAKE 1 TABLET BY MOUTH EVERYDAY AT BEDTIME   tamsulosin 0.4 MG Caps capsule Commonly known as: FLOMAX TAKE 1 CAPSULE BY MOUTH EVERY DAY AFTER BREAKFAST   vitamin C 1000 MG tablet Take 1,000 mg by mouth daily.   Zinc 100 MG Tabs Take 1 tablet by mouth daily.       Allergies: No Known Allergies  Family History: Family History  Problem Relation Age of Onset  . Stroke Mother 31       brain aneurysm rupture   . COPD Mother   . Early death Father   . Stroke Maternal Grandmother 62  . Cancer Maternal Grandmother   . Cancer Sister        cervical ca mets to lung   . COPD Sister     Social History:  reports that he has never smoked. He has never used smokeless tobacco. He reports current alcohol use of about 4.0 standard drinks of alcohol per week. He reports that he does not use drugs.   Physical Exam: BP (!) 143/80   Pulse 77   Ht 5\' 9"  (1.753 m)   Wt 173 lb (78.5 kg)   BMI 25.55 kg/m  Constitutional:  Alert and oriented, No acute distress. HEENT: [place] AT, moist mucus membranes.  Trachea midline, no masses. Cardiovascular: No clubbing, cyanosis, or edema. Respiratory: Normal respiratory effort, no increased work of breathing. GI: Abdomen is soft, nontender, nondistended, no abdominal masses GU: Prostate 50 g, smooth without nodules Lymph: No cervical or inguinal lymphadenopathy. Skin: No rashes, bruises or suspicious lesions. Neurologic: Grossly intact, no focal deficits, moving all 4 extremities. Psychiatric: Normal mood and affect.  Laboratory Data:  Lab Results  Component Value Date   PSA 3.10 03/11/2020   PSA 4.24 (H) 02/05/2020   PSA 3.2 01/30/2019    Assessment & Plan:    - T1c low risk prostate cancer We again discussed the standard recommendation of a confirmatory  biopsy 12-24 months after the procedure.  Will schedule prostate MRI initially.  - BPH with lower urinary tract symptoms Stable LUTS on tamsulosin which was refilled.   Abbie Sons, [place] [address], [place], [place] [address] [phone]

## 2020-03-27 ENCOUNTER — Encounter: Payer: Self-pay | Admitting: Urology

## 2020-03-27 MED ORDER — TAMSULOSIN HCL 0.4 MG PO CAPS: ORAL_CAPSULE | ORAL | 3 refills | Status: DC

## 2020-03-28 ENCOUNTER — Encounter
Admission: RE | Disposition: A | Discharge status: Home / Self Care | Payer: Self-pay | Source: Home / Self Care | Attending: Gastroenterology

## 2020-03-28 ENCOUNTER — Ambulatory Visit
Admission: RE | Admit: 2020-03-28 | Discharge: 2020-03-28 | Disposition: A | Discharge status: Home / Self Care | Payer: BC Managed Care – PPO | Attending: Gastroenterology | Admitting: Gastroenterology

## 2020-03-28 ENCOUNTER — Ambulatory Visit: Payer: BC Managed Care – PPO | Admitting: Anesthesiology

## 2020-03-28 ENCOUNTER — Other Ambulatory Visit: Payer: Self-pay

## 2020-03-28 ENCOUNTER — Encounter: Payer: Self-pay | Admitting: Gastroenterology

## 2020-03-28 DIAGNOSIS — Z86010 Personal history of colonic polyps: Secondary | ICD-10-CM | POA: Diagnosis not present

## 2020-03-28 DIAGNOSIS — Z1211 Encounter for screening for malignant neoplasm of colon: Secondary | ICD-10-CM | POA: Diagnosis not present

## 2020-03-28 DIAGNOSIS — K635 Polyp of colon: Secondary | ICD-10-CM

## 2020-03-28 DIAGNOSIS — Z79899 Other long term (current) drug therapy: Secondary | ICD-10-CM | POA: Insufficient documentation

## 2020-03-28 DIAGNOSIS — E785 Hyperlipidemia, unspecified: Secondary | ICD-10-CM | POA: Insufficient documentation

## 2020-03-28 DIAGNOSIS — D122 Benign neoplasm of ascending colon: Secondary | ICD-10-CM | POA: Diagnosis not present

## 2020-03-28 DIAGNOSIS — Z8546 Personal history of malignant neoplasm of prostate: Secondary | ICD-10-CM | POA: Insufficient documentation

## 2020-03-28 HISTORY — PX: COLONOSCOPY WITH PROPOFOL: SHX5780

## 2020-03-28 SURGERY — COLONOSCOPY WITH PROPOFOL
Anesthesia: General

## 2020-03-28 MED ORDER — SODIUM CHLORIDE 0.9 % IV SOLN: INTRAVENOUS | Status: DC

## 2020-03-28 MED ORDER — PROPOFOL 500 MG/50ML IV EMUL
INTRAVENOUS | Status: DC | PRN
  Administered 2020-03-28: 140 ug/kg/min via INTRAVENOUS

## 2020-03-28 MED ORDER — PROPOFOL 10 MG/ML IV BOLUS
INTRAVENOUS | Status: DC | PRN
  Administered 2020-03-28: 70 mg via INTRAVENOUS

## 2020-03-28 NOTE — Anesthesia Postprocedure Evaluation (Signed)
Anesthesia Post Note  Patient: Edward Tran  Procedure(s) Performed: COLONOSCOPY WITH PROPOFOL (N/A )  Patient location during evaluation: PACU Anesthesia Type: General Level of consciousness: awake and alert Pain management: pain level controlled Vital Signs Assessment: post-procedure vital signs reviewed and stable Respiratory status: spontaneous breathing, nonlabored ventilation and respiratory function stable Cardiovascular status: blood pressure returned to baseline and stable Postop Assessment: no apparent nausea or vomiting Anesthetic complications: no     Last Vitals:  Vitals:   03/28/20 1059 03/28/20 1129  BP: 112/72 (!) 131/95  Pulse:    Resp:    Temp: (!) 36.3 C   SpO2:      Last Pain:  Vitals:   03/28/20 1129  TempSrc:   PainSc: 0-No pain                 Tera Mater

## 2020-03-28 NOTE — Transfer of Care (Signed)
Immediate Anesthesia Transfer of Care Note  Patient: KHAMAR STANDARD  Procedure(s) Performed: COLONOSCOPY WITH PROPOFOL (N/A )  Patient Location: PACU  Anesthesia Type:General  Level of Consciousness: sedated  Airway & Oxygen Therapy: Patient Spontanous Breathing  Post-op Assessment: Report given to RN and Post -op Vital signs reviewed and stable  Post vital signs: Reviewed and stable  Last Vitals:  Vitals Value Taken Time  BP    Temp    Pulse    Resp    SpO2      Last Pain:  Vitals:   03/28/20 1059  TempSrc: Temporal  PainSc:          Complications: No apparent anesthesia complications

## 2020-03-28 NOTE — H&P (Signed)
Jonathon Bellows, MD [address], [place], [place], [place], [address] [address], [place], [place], [place], [address] Phone: [phone]  Fax: [phone]  Primary Care Physician:  Crecencio Mc, MD   Pre-Procedure History & Physical: HPI:  Edward Tran is a 69 y.o. male is here for an colonoscopy.   Past Medical History:  Diagnosis Date  . Hyperlipidemia   . Medical history non-contributory   . Prostate cancer (HCC)    low score  . Umbilical hernia without obstruction and without gangrene 01/20/2017    Past Surgical History:  Procedure Laterality Date  . COLONOSCOPY  2016  . TONSILLECTOMY AND ADENOIDECTOMY    . UMBILICAL HERNIA REPAIR N/A 11/17/2018   Procedure: HERNIA REPAIR UMBILICAL ADULT;  Surgeon: Robert Bellow, MD;  Location: [facility] ORS;  Service: General;  Laterality: N/A;    Prior to Admission medications   Medication Sig Start Date End Date Taking? Authorizing Provider  Ascorbic Acid (VITAMIN C) 1000 MG tablet Take 1,000 mg by mouth daily.  12/07/19   [provider]  cholecalciferol (VITAMIN D) 25 MCG (1000 UT) tablet Take 1,000 Units by mouth daily.     [provider]  loratadine (CLARITIN) 10 MG tablet TAKE 1 TABLET BY MOUTH EVERY DAY FOR ALLERGIES (OTC NOT COVERED) 11/13/19   Crecencio Mc, MD  Omega-3 Fatty Acids (FISH OIL) 1200 MG CAPS Take 2,400 mg by mouth daily.     [provider]  sildenafil (REVATIO) 20 MG tablet TAKE 1 TABLET BY MOUTH THREE TIMES A DAY 11/30/19   Crecencio Mc, MD  simvastatin (ZOCOR) 20 MG tablet TAKE 1 TABLET BY MOUTH EVERYDAY AT BEDTIME 01/05/20   Crecencio Mc, MD  tamsulosin (FLOMAX) 0.4 MG CAPS capsule TAKE 1 CAPSULE BY MOUTH EVERY DAY AFTER BREAKFAST 03/27/20   Stoioff, Ronda Fairly, MD  Zinc 100 MG TABS Take 1 tablet by mouth daily.  12/07/19   [provider]    Allergies as of 02/09/2020  . (No Known Allergies)    Family History  Problem Relation Age of Onset  . Stroke Mother 79        brain aneurysm rupture   . COPD Mother   . Early death Father   . Stroke Maternal Grandmother 62  . Cancer Maternal Grandmother   . Cancer Sister        cervical ca mets to lung   . COPD Sister     Social History   Socioeconomic History  . Marital status: Married    Spouse name: Not on file  . Number of children: Not on file  . Years of education: Not on file  . Highest education level: Not on file  Occupational History  . Not on file  Tobacco Use  . Smoking status: Never Smoker  . Smokeless tobacco: Never Used  Substance and Sexual Activity  . Alcohol use: Yes    Alcohol/week: 4.0 standard drinks    Types: 4 Cans of beer per week    Comment: moderate  . Drug use: No  . Sexual activity: Yes  Other Topics Concern  . Not on file  Social History Narrative  . Not on file   Social Determinants of Health   Financial Resource Strain:   . Difficulty of Paying Living Expenses:   Food Insecurity:   . Worried About Charity fundraiser in the Last Year:   . Arboriculturist in the Last Year:   [employer]  Needs:   . Lack of Transportation (Medical):   Marland Kitchen Lack of Transportation (Non-Medical):   Physical Activity:   . Days of Exercise per Week:   . Minutes of Exercise per Session:   Stress:   . Feeling of Stress :   Social Connections:   . Frequency of Communication with Friends and Family:   . Frequency of Social Gatherings with Friends and Family:   . Attends Religious Services:   . Active Member of Clubs or Organizations:   . Attends Archivist Meetings:   Marland Kitchen Marital Status:   Intimate Partner Violence:   . Fear of Current or Ex-Partner:   . Emotionally Abused:   Marland Kitchen Physically Abused:   . Sexually Abused:     Review of Systems: See HPI, otherwise negative ROS  Physical Exam: BP (!) 143/83   Pulse 63   Temp (!) 97.4 F (36.3 C) (Temporal)   Resp 17   Ht 5\' 8"  (1.727 m)   Wt 77.1 kg   SpO2 99%   BMI 25.85 kg/m  General:   Alert,   pleasant and cooperative in NAD Head:  Normocephalic and atraumatic. Neck:  Supple; no masses or thyromegaly. Lungs:  Clear throughout to auscultation, normal respiratory effort.    Heart:  +S1, +S2, Regular rate and rhythm, No edema. Abdomen:  Soft, nontender and nondistended. Normal bowel sounds, without guarding, and without rebound.   Neurologic:  Alert and  oriented x4;  grossly normal neurologically.  Impression/Plan: Edward Tran is here for an colonoscopy to be performed for surveillance due to prior history of colon polyps   Risks, benefits, limitations, and alternatives regarding  colonoscopy have been reviewed with the patient.  Questions have been answered.  All parties agreeable.   Jonathon Bellows, MD  03/28/2020, 10:38 AM

## 2020-03-28 NOTE — Op Note (Signed)
[facility]astroenterology Patient Name: Edward Tran Procedure Date: 03/28/2020 10:43 AM MRN: [id number] Account #: [id number] Date of Birth: Nov 11, 1951 Admit Type: Outpatient Age: 69 Room: [facility] ENDO ROOM 4 Gender: Male Note Status: Finalized Procedure:             Colonoscopy Indications:           High risk colon cancer surveillance: Personal history                         of colonic polyps Providers:             Jonathon Bellows MD, MD Referring MD:          Deborra Medina, MD (Referring MD) Medicines:             Monitored Anesthesia Care Complications:         No immediate complications. Procedure:             Pre-Anesthesia Assessment:                        - Prior to the procedure, a History and Physical was                         performed, and patient medications, allergies and                         sensitivities were reviewed. The patient's tolerance                         of previous anesthesia was reviewed.                        - The risks and benefits of the procedure and the                         sedation options and risks were discussed with the                         patient. All questions were answered and informed                         consent was obtained.                        - ASA Grade Assessment: II - A patient with mild                         systemic disease.                        After obtaining informed consent, the colonoscope was                         passed under direct vision. Throughout the procedure,                         the patient's blood pressure, pulse, and oxygen                         saturations were monitored continuously. The  Colonoscope was introduced through the anus and                         advanced to the the cecum, identified by the                         appendiceal orifice. The colonoscopy was performed                         with ease. The patient tolerated the  procedure well.                         The quality of the bowel preparation was excellent. Findings:      The perianal and digital rectal examinations were normal.      A 5 mm polyp was found in the ascending colon. The polyp was sessile.       The polyp was removed with a cold snare. Resection and retrieval were       complete.      The exam was otherwise without abnormality on direct and retroflexion       views. Impression:            - One 5 mm polyp in the ascending colon, removed with                         a cold snare. Resected and retrieved.                        - The examination was otherwise normal on direct and                         retroflexion views. Recommendation:        - Discharge patient to home (with escort).                        - Resume previous diet.                        - Continue present medications.                        - Await pathology results.                        - Repeat colonoscopy in 5 years for surveillance. Procedure Code(s):     --- Professional ---                        [phone], Colonoscopy, flexible; with removal of                         tumor(s), polyp(s), or other lesion(s) by snare                         technique Diagnosis Code(s):     --- Professional ---                        Z86.010, Personal history of colonic polyps  K63.5, Polyp of colon CPT copyright 2019 American Medical Association. All rights reserved. The codes documented in this report are preliminary and upon coder review may  be revised to meet current compliance requirements. Jonathon Bellows, MD Jonathon Bellows MD, MD 03/28/2020 10:59:37 AM This report has been signed electronically. Number of Addenda: 0 Note Initiated On: 03/28/2020 10:43 AM Scope Withdrawal Time: 0 hours 9 minutes 39 seconds  Total Procedure Duration: 0 hours 12 minutes 10 seconds  Estimated Blood Loss:  Estimated blood loss: none.      [facility]

## 2020-03-28 NOTE — Anesthesia Preprocedure Evaluation (Signed)
Anesthesia Evaluation  Patient identified by MRN, date of birth, ID band Patient awake    Reviewed: Allergy & Precautions, H&P , NPO status , Patient's Chart, lab work & pertinent test results  History of Anesthesia Complications Negative for: history of anesthetic complications  Airway Mallampati: II  TM Distance: >3 FB Neck ROM: full    Dental  (+) Teeth Intact   Pulmonary neg pulmonary ROS, neg shortness of breath, neg COPD,    breath sounds clear to auscultation       Cardiovascular (-) angina(-) Past MI negative cardio ROS  (-) dysrhythmias  Rhythm:regular Rate:Normal     Neuro/Psych negative neurological ROS  negative psych ROS   GI/Hepatic negative GI ROS, Neg liver ROS,   Endo/Other  negative endocrine ROS  Renal/GU negative Renal ROS  negative genitourinary   Musculoskeletal   Abdominal   Peds  Hematology negative hematology ROS (+)   Anesthesia Other Findings Past Medical History: No date: Hyperlipidemia No date: Medical history non-contributory No date: Prostate cancer ([place])     Comment:  low score 0000000: Umbilical hernia without obstruction and without gangrene  Past Surgical History: 2016: COLONOSCOPY No date: TONSILLECTOMY AND ADENOIDECTOMY [id number]: UMBILICAL HERNIA REPAIR; N/A     Comment:  Procedure: HERNIA REPAIR UMBILICAL ADULT;  Surgeon:               Robert Bellow, MD;  Location: [facility] ORS;  Service:               General;  Laterality: N/A;  BMI    Body Mass Index: 25.85 kg/m      Reproductive/Obstetrics negative OB ROS                             Anesthesia Physical Anesthesia Plan  ASA: II  Anesthesia Plan: General   Post-op Pain Management:    Induction:   PONV Risk Score and Plan: Propofol infusion and TIVA  Airway Management Planned:   Additional Equipment:   Intra-op Plan:   Post-operative Plan:   Informed Consent: I  have reviewed the patients History and Physical, chart, labs and discussed the procedure including the risks, benefits and alternatives for the proposed anesthesia with the patient or authorized representative who has indicated his/her understanding and acceptance.     Dental Advisory Given  Plan Discussed with: Anesthesiologist  Anesthesia Plan Comments:         Anesthesia Quick Evaluation

## 2020-03-29 ENCOUNTER — Encounter: Payer: Self-pay | Admitting: *Deleted

## 2020-03-29 LAB — SURGICAL PATHOLOGY

## 2020-04-03 ENCOUNTER — Encounter: Payer: Self-pay | Admitting: Gastroenterology

## 2020-04-10 ENCOUNTER — Other Ambulatory Visit: Payer: Self-pay | Admitting: Internal Medicine

## 2020-04-19 ENCOUNTER — Encounter: Payer: Self-pay | Admitting: Urology

## 2020-04-25 ENCOUNTER — Telehealth: Payer: Self-pay | Admitting: Internal Medicine

## 2020-04-25 NOTE — Telephone Encounter (Signed)
The patient called regarding his GI bill . The patient stated that someone in this office informed him that his colonoscopy was covered by his insurance. I informed the patient that is not something that we would know. However, his referral was sent to GI as a screening colonoscopy. His insurance did not want to cover because they found a polyp changing the procedure from a screening to a diagnostic. I stated that he should call his insurance carrier to inform them that he was scheduled as a screening to see if anything would change on there end.

## 2020-05-05 ENCOUNTER — Other Ambulatory Visit: Payer: Self-pay | Admitting: Internal Medicine

## 2020-05-09 ENCOUNTER — Other Ambulatory Visit: Payer: Self-pay

## 2020-05-09 ENCOUNTER — Ambulatory Visit
Admission: RE | Admit: 2020-05-09 | Discharge: 2020-05-09 | Disposition: A | Discharge status: Home / Self Care | Payer: BC Managed Care – PPO | Source: Ambulatory Visit | Attending: Urology | Admitting: Urology

## 2020-05-09 DIAGNOSIS — C61 Malignant neoplasm of prostate: Secondary | ICD-10-CM | POA: Insufficient documentation

## 2020-05-09 MED ORDER — GADOBUTROL 1 MMOL/ML IV SOLN
7.0000 mL | Freq: Once | INTRAVENOUS | Status: AC | PRN
  Administered 2020-05-09: 7 mL via INTRAVENOUS

## 2020-05-16 ENCOUNTER — Telehealth: Payer: Self-pay | Admitting: *Deleted

## 2020-05-16 NOTE — Telephone Encounter (Signed)
-----   Message from Abbie Sons, MD sent at 05/14/2020 11:53 AM EDT ----- Prostate MRI showed no suspicious abnormalities.  Recommend scheduling standard confirmatory prostate biopsy

## 2020-05-17 ENCOUNTER — Encounter: Payer: Self-pay | Admitting: Urology

## 2020-05-18 ENCOUNTER — Encounter: Payer: Self-pay | Admitting: Urology

## 2020-05-18 NOTE — Telephone Encounter (Signed)
Notified patient as instructed,.  

## 2020-05-31 ENCOUNTER — Other Ambulatory Visit: Payer: Self-pay | Admitting: *Deleted

## 2020-05-31 DIAGNOSIS — C61 Malignant neoplasm of prostate: Secondary | ICD-10-CM

## 2020-05-31 MED ORDER — TAMSULOSIN HCL 0.4 MG PO CAPS: ORAL_CAPSULE | ORAL | 3 refills | Status: DC

## 2020-05-31 NOTE — Telephone Encounter (Signed)
Patient states the tamsulosin has been work well. He is glad that he is not take 2 daily.

## 2020-06-01 ENCOUNTER — Other Ambulatory Visit: Payer: BC Managed Care – PPO | Admitting: Urology

## 2020-06-16 ENCOUNTER — Encounter: Payer: Self-pay | Admitting: Urology

## 2020-06-16 ENCOUNTER — Ambulatory Visit (INDEPENDENT_AMBULATORY_CARE_PROVIDER_SITE_OTHER): Payer: BC Managed Care – PPO | Admitting: Urology

## 2020-06-16 ENCOUNTER — Other Ambulatory Visit: Payer: Self-pay | Admitting: Urology

## 2020-06-16 ENCOUNTER — Other Ambulatory Visit: Payer: Self-pay

## 2020-06-16 VITALS — BP 145/95 | HR 85 | Ht 68.0 in | Wt 175.0 lb

## 2020-06-16 DIAGNOSIS — C61 Malignant neoplasm of prostate: Secondary | ICD-10-CM

## 2020-06-16 MED ORDER — LEVOFLOXACIN 500 MG PO TABS
500.0000 mg | ORAL_TABLET | Freq: Once | ORAL | Status: AC
  Administered 2020-06-16: 500 mg via ORAL

## 2020-06-16 MED ORDER — GENTAMICIN SULFATE 40 MG/ML IJ SOLN
80.0000 mg | Freq: Once | INTRAMUSCULAR | Status: AC
  Administered 2020-06-16: 80 mg via INTRAMUSCULAR

## 2020-06-16 NOTE — Progress Notes (Signed)
Prostate Biopsy Procedure   Informed consent was obtained after discussing risks/benefits of the procedure.  A time out was performed to ensure correct patient identity.  Pre-Procedure: - Last PSA Level: Low risk prostate cancer on surveillance; recent MRI without suspicious lesions; prostate volume 63 cc.  Presents for confirmatory biopsy  Lab Results  Component Value Date   PSA 3.10 03/11/2020   PSA 4.24 (H) 02/05/2020   PSA 3.2 01/30/2019   - Gentamicin given prophylactically - Levaquin 500 mg administered PO -Transrectal Ultrasound performed revealing a 64 gm prostate -No significant hypoechoic or median lobe noted  Procedure: - Prostate block performed using 10 cc 1% lidocaine and biopsies taken from sextant areas, a total of 12 under ultrasound guidance.  Post-Procedure: - Patient tolerated the procedure well - He was counseled to seek immediate medical attention if experiences any severe pain, significant bleeding, or fevers - Return in one week to discuss biopsy results   John Giovanni, MD

## 2020-06-22 ENCOUNTER — Encounter: Payer: Self-pay | Admitting: Urology

## 2020-06-22 LAB — SURGICAL PATHOLOGY

## 2020-06-23 LAB — PATHOLOGY

## 2020-08-24 ENCOUNTER — Other Ambulatory Visit: Payer: Self-pay | Admitting: Internal Medicine

## 2020-09-08 ENCOUNTER — Telehealth: Payer: Self-pay

## 2020-09-08 NOTE — Telephone Encounter (Signed)
Patient wants to know should he get pneumonia vaccine & which one? If okay can I get him scheduled for prevnar right?

## 2020-09-08 NOTE — Telephone Encounter (Signed)
Close  

## 2020-09-25 ENCOUNTER — Other Ambulatory Visit: Payer: Self-pay | Admitting: Internal Medicine

## 2020-10-13 ENCOUNTER — Other Ambulatory Visit: Payer: Self-pay

## 2020-10-13 ENCOUNTER — Ambulatory Visit (INDEPENDENT_AMBULATORY_CARE_PROVIDER_SITE_OTHER): Payer: Medicare HMO

## 2020-10-13 DIAGNOSIS — Z23 Encounter for immunization: Secondary | ICD-10-CM

## 2020-10-13 NOTE — Progress Notes (Signed)
Patient presented for prevnar injection to left deltoid, patient voiced no concerns nor showed any signs of distress during injection.

## 2020-10-29 ENCOUNTER — Other Ambulatory Visit: Payer: Self-pay | Admitting: Internal Medicine

## 2020-11-10 DIAGNOSIS — H02831 Dermatochalasis of right upper eyelid: Secondary | ICD-10-CM | POA: Diagnosis not present

## 2020-11-10 DIAGNOSIS — H57813 Brow ptosis, bilateral: Secondary | ICD-10-CM | POA: Diagnosis not present

## 2020-11-15 ENCOUNTER — Other Ambulatory Visit: Payer: Self-pay | Admitting: Urology

## 2020-11-15 DIAGNOSIS — C61 Malignant neoplasm of prostate: Secondary | ICD-10-CM

## 2020-11-16 ENCOUNTER — Other Ambulatory Visit: Payer: Self-pay

## 2020-11-16 MED ORDER — SIMVASTATIN 20 MG PO TABS: 20.0000 mg | ORAL_TABLET | Freq: Every evening | ORAL | 0 refills | Status: DC

## 2020-12-24 DIAGNOSIS — E782 Mixed hyperlipidemia: Secondary | ICD-10-CM

## 2020-12-27 ENCOUNTER — Other Ambulatory Visit: Payer: Self-pay

## 2020-12-27 ENCOUNTER — Other Ambulatory Visit: Payer: BC Managed Care – PPO

## 2020-12-28 ENCOUNTER — Ambulatory Visit: Payer: Self-pay | Admitting: Urology

## 2021-01-04 NOTE — Telephone Encounter (Signed)
Called and spoke to [place]. Edward Tran states that he needed to have his Physical pushed out further to avoid a complication with Insurance. Moved his appointment from 03/09/2021 to 03/22/2021. Patient wanted to receive their labs earlier than their physical and scheduled labs for 03/15/2021. Which labs would you like ordered for the patient? Edward Tran wanted to make Dr. Derrel Nip aware that he will see his Urologist and will receive a PSA lab done in March.

## 2021-01-09 ENCOUNTER — Other Ambulatory Visit: Payer: Self-pay | Admitting: Internal Medicine

## 2021-01-23 DIAGNOSIS — C61 Malignant neoplasm of prostate: Secondary | ICD-10-CM | POA: Diagnosis not present

## 2021-01-23 DIAGNOSIS — H57813 Brow ptosis, bilateral: Secondary | ICD-10-CM | POA: Diagnosis not present

## 2021-01-23 DIAGNOSIS — H539 Unspecified visual disturbance: Secondary | ICD-10-CM | POA: Diagnosis not present

## 2021-01-23 DIAGNOSIS — H02831 Dermatochalasis of right upper eyelid: Secondary | ICD-10-CM | POA: Diagnosis not present

## 2021-01-23 DIAGNOSIS — H02834 Dermatochalasis of left upper eyelid: Secondary | ICD-10-CM | POA: Diagnosis not present

## 2021-02-09 ENCOUNTER — Other Ambulatory Visit: Payer: Self-pay

## 2021-02-09 ENCOUNTER — Telehealth: Payer: Self-pay | Admitting: Internal Medicine

## 2021-02-09 DIAGNOSIS — J302 Other seasonal allergic rhinitis: Secondary | ICD-10-CM

## 2021-02-09 MED ORDER — LORATADINE 10 MG PO TABS: ORAL_TABLET | ORAL | 0 refills | Status: DC

## 2021-02-09 NOTE — Telephone Encounter (Signed)
Refill for Claritin sent to [facility] per patient request. Arbutus Nelligan,cma

## 2021-02-09 NOTE — Telephone Encounter (Signed)
Patient called in for refill forloratadine (CLARITIN) 10 MG tablet he wants it to go to [facility]

## 2021-02-13 ENCOUNTER — Other Ambulatory Visit: Payer: Self-pay

## 2021-02-13 DIAGNOSIS — C61 Malignant neoplasm of prostate: Secondary | ICD-10-CM

## 2021-02-14 ENCOUNTER — Other Ambulatory Visit: Payer: Medicare HMO

## 2021-02-14 ENCOUNTER — Other Ambulatory Visit: Payer: Self-pay

## 2021-02-14 DIAGNOSIS — C61 Malignant neoplasm of prostate: Secondary | ICD-10-CM | POA: Diagnosis not present

## 2021-02-15 ENCOUNTER — Ambulatory Visit: Payer: Medicare HMO | Admitting: Urology

## 2021-02-15 VITALS — BP 160/75 | HR 83 | Ht 68.0 in | Wt 175.0 lb

## 2021-02-15 DIAGNOSIS — C61 Malignant neoplasm of prostate: Secondary | ICD-10-CM

## 2021-02-15 LAB — PSA: Prostate Specific Ag, Serum: 4.7 ng/mL — ABNORMAL HIGH (ref 0.0–4.0)

## 2021-02-16 ENCOUNTER — Encounter: Payer: Self-pay | Admitting: Urology

## 2021-02-16 NOTE — Progress Notes (Signed)
02/15/2021 7:13 AM   Edward Tran [birth year] [id number]  Referring provider: Crecencio Mc, MD [address],  [place] [address]  Chief Complaint  Patient presents with  . Prostate Cancer    Urologic history: 1. cT1cadenocarcinoma prostate -Biopsy June 2019; PSA 4.0; volume 52 g -Left mid core with focal Gleason 3+3 at 5% -Elected active surveillance -pMRI 05/2020 with 63 cc volume and no suspicious lesions; PI-RADS 1 -Confirmatory biopsy 06/16/2020; 64 g prostate; LB with focal ASAP; no carcinoma identified  2.BPH with lower urinary tract symptoms -On tamsulosin   HPI: 70 y.o. male presents for semiannual follow-up.   Lower urinary tract symptoms have improved with titrating tamsulosin to 0.8 mg  PSA 02/14/2021 4.7  Denies dysuria, gross hematuria  No flank, abdominal or pelvic pain   PMH: Past Medical History:  Diagnosis Date  . Hyperlipidemia   . Medical history non-contributory   . Prostate cancer (HCC)    low score  . Umbilical hernia without obstruction and without gangrene 01/20/2017    Surgical History: Past Surgical History:  Procedure Laterality Date  . COLONOSCOPY  2016  . COLONOSCOPY WITH PROPOFOL N/A 03/28/2020   Procedure: COLONOSCOPY WITH PROPOFOL;  Surgeon: Jonathon Bellows, MD;  Location: [facility] ENDOSCOPY;  Service: Gastroenterology;  Laterality: N/A;  . TONSILLECTOMY AND ADENOIDECTOMY    . UMBILICAL HERNIA REPAIR N/A 11/17/2018   Procedure: HERNIA REPAIR UMBILICAL ADULT;  Surgeon: Robert Bellow, MD;  Location: [facility] ORS;  Service: General;  Laterality: N/A;    Home Medications:  Allergies as of 02/15/2021   No Known Allergies     Medication List       Accurate as of February 15, 2021 11:59 PM. If you have any questions, ask your nurse or doctor.        cholecalciferol 25 MCG (1000 UNIT) tablet Commonly known as: VITAMIN D Take 1,000 Units by mouth daily.   Fish Oil 1200 MG Caps Take 2,400 mg by mouth  daily.   loratadine 10 MG tablet Commonly known as: CLARITIN TAKE 1 TABLET BY MOUTH EVERY DAY FOR ALLERGIES (OTC NOT COVERED)   sildenafil 20 MG tablet Commonly known as: REVATIO TAKE 1 TABLET BY MOUTH THREE TIMES A DAY   simvastatin 20 MG tablet Commonly known as: ZOCOR TAKE 1 TABLET BY MOUTH EVERYDAY AT BEDTIME   tamsulosin 0.4 MG Caps capsule Commonly known as: FLOMAX TAKE 2 CAPSULE BY MOUTH EVERY DAY AFTER BREAKFAST   vitamin C 1000 MG tablet Take 1,000 mg by mouth daily.   Zinc 100 MG Tabs Take 1 tablet by mouth daily.       Allergies: No Known Allergies  Family History: Family History  Problem Relation Age of Onset  . Stroke Mother 29       brain aneurysm rupture   . COPD Mother   . Early death Father   . Stroke Maternal Grandmother 62  . Cancer Maternal Grandmother   . Cancer Sister        cervical ca mets to lung   . COPD Sister     Social History:  reports that he has never smoked. He has never used smokeless tobacco. He reports current alcohol use of about 4.0 standard drinks of alcohol per week. He reports that he does not use drugs.   Physical Exam: BP (!) 160/75   Pulse 83   Ht 5\' 8"  (1.727 m)   Wt 175 lb (79.4 kg)   BMI 26.61 kg/m  Constitutional:  Alert and oriented, No acute distress. HEENT: [place] AT, moist mucus membranes.  Trachea midline, no masses. Cardiovascular: No clubbing, cyanosis, or edema. Respiratory: Normal respiratory effort, no increased work of breathing. GU: Prostate 60 g, smooth without nodules   Assessment & Plan:    1.  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

## 2021-03-09 ENCOUNTER — Encounter: Payer: Medicare HMO | Admitting: Internal Medicine

## 2021-03-15 ENCOUNTER — Other Ambulatory Visit: Payer: Self-pay

## 2021-03-15 ENCOUNTER — Other Ambulatory Visit (INDEPENDENT_AMBULATORY_CARE_PROVIDER_SITE_OTHER): Payer: Medicare HMO

## 2021-03-15 DIAGNOSIS — E782 Mixed hyperlipidemia: Secondary | ICD-10-CM | POA: Diagnosis not present

## 2021-03-15 LAB — LIPID PANEL
Cholesterol: 143 mg/dL (ref 0–200)
HDL: 55.9 mg/dL (ref >39.00)
LDL Cholesterol: 73 mg/dL (ref 0–99)
NonHDL: 87.39
Total CHOL/HDL Ratio: 3
Triglycerides: 74 mg/dL (ref 0.0–149.0)
VLDL: 14.8 mg/dL (ref 0.0–40.0)

## 2021-03-15 LAB — COMPREHENSIVE METABOLIC PANEL
ALT: 17 U/L (ref 0–53)
AST: 17 U/L (ref 0–37)
Albumin: 4.1 g/dL (ref 3.5–5.2)
Alkaline Phosphatase: 67 U/L (ref 39–117)
BUN: 17 mg/dL (ref 6–23)
CO2: 27 mEq/L (ref 19–32)
Calcium: 9.3 mg/dL (ref 8.4–10.5)
Chloride: 107 mEq/L (ref 96–112)
Creatinine, Ser: 0.98 mg/dL (ref 0.40–1.50)
GFR: 79.65 mL/min (ref >60.00)
Glucose, Bld: 95 mg/dL (ref 70–99)
Potassium: 4.1 mEq/L (ref 3.5–5.1)
Sodium: 140 mEq/L (ref 135–145)
Total Bilirubin: 0.6 mg/dL (ref 0.2–1.2)
Total Protein: 6.7 g/dL (ref 6.0–8.3)

## 2021-03-21 ENCOUNTER — Other Ambulatory Visit: Payer: Self-pay | Admitting: Family Medicine

## 2021-03-21 DIAGNOSIS — C61 Malignant neoplasm of prostate: Secondary | ICD-10-CM

## 2021-03-22 ENCOUNTER — Ambulatory Visit (INDEPENDENT_AMBULATORY_CARE_PROVIDER_SITE_OTHER): Payer: Medicare HMO | Admitting: Internal Medicine

## 2021-03-22 ENCOUNTER — Encounter: Payer: Self-pay | Admitting: Internal Medicine

## 2021-03-22 ENCOUNTER — Other Ambulatory Visit: Payer: Self-pay

## 2021-03-22 VITALS — BP 124/80 | HR 61 | Temp 98.4°F | Resp 14 | Ht 68.0 in | Wt 183.2 lb

## 2021-03-22 DIAGNOSIS — R030 Elevated blood-pressure reading, without diagnosis of hypertension: Secondary | ICD-10-CM

## 2021-03-22 DIAGNOSIS — E782 Mixed hyperlipidemia: Secondary | ICD-10-CM | POA: Diagnosis not present

## 2021-03-22 DIAGNOSIS — Z0000 Encounter for general adult medical examination without abnormal findings: Secondary | ICD-10-CM | POA: Diagnosis not present

## 2021-03-22 DIAGNOSIS — C61 Malignant neoplasm of prostate: Secondary | ICD-10-CM | POA: Diagnosis not present

## 2021-03-22 DIAGNOSIS — D126 Benign neoplasm of colon, unspecified: Secondary | ICD-10-CM | POA: Diagnosis not present

## 2021-03-22 NOTE — Progress Notes (Signed)
[name], male    DOB: [birth year]  Age: 70 y.o. MRN: [id number]  The patient is here for annual  wellness examination and management of other chronic and acute problems.   The risk factors are reflected in the social history.  The roster of all physicians providing medical care to patient - is listed in the Snapshot section of the chart.  Activities of daily living:  The patient is 100% independent in all ADLs: dressing, toileting, feeding as well as independent mobility  Home safety : The patient has smoke detectors in the home. They wear seatbelts.  There are no firearms at home. There is no violence in the home.   There is no risks for hepatitis, STDs or HIV. There is no   history of blood transfusion. They have no travel history to infectious disease endemic areas of the world.  The patient has seen their dentist in the last six month. They have seen their eye doctor in the last year. He denies  hearing difficulty with regard to whispered voices and some television programs.  They have deferred audiologic testing in the last year.  They do not  have excessive sun exposure. Discussed the need for sun protection: hats, long sleeves and use of sunscreen if there is significant sun exposure.   Diet: the importance of a healthy diet is discussed. They do have a healthy diet.  The benefits of regular aerobic exercise were discussed. He exercises  3 to 5  times per week ,  60 minutes.   Depression screen: there are no signs or vegative symptoms of depression- irritability, change in appetite, anhedonia, sadness/tearfullness.  Cognitive assessment: the patient manages all their financial and personal affairs and is actively engaged. They could relate day,date,year and events; recalled 2/3 objects at 3 minutes; performed clock-face test normally.  The following portions of the patient's history were reviewed and updated as appropriate: allergies, current medications, past family  history, past medical history,  past surgical history, past social history  and problem list.  Visual acuity was not assessed per patient preference since she has regular follow up with her ophthalmologist. Hearing and body mass index were assessed and reviewed.   During the course of the visit the patient was educated and counseled about appropriate screening and preventive services including : fall prevention , diabetes screening, nutrition counseling, colorectal cancer screening, and recommended immunizations.    CC: Diagnoses of Visit for preventive health examination, Mixed hyperlipidemia, Elevated blood pressure reading without diagnosis of hypertension, Adenomatous polyp of colon, unspecified part of colon, and Prostate cancer ([place]) were pertinent to this visit.  1) had eyelid surgery in February to correct drooping which was interfering with vision   2) Elevated PSA:  S/p  ostate biopsies  July benign,  Elevated PSA last month 4.7,  Repeat planned by Urology  In June  3) retiring May 6.    25th wedding anniversary coming up   4) Forensic scientist .  Posture addressed  History Capri has a past medical history of Hyperlipidemia, Medical history non-contributory, Prostate cancer ([place]), and Umbilical hernia without obstruction and without gangrene (01/20/2017).   He has a past surgical history that includes Tonsillectomy and adenoidectomy; Colonoscopy (2016); Umbilical hernia repair (N/A, 11/17/2018); and Colonoscopy with propofol (N/A, 03/28/2020).   His family history includes COPD in his mother and sister; Cancer in his maternal grandmother and sister; Early death in his father; Stroke (age of onset: 42) in his mother; Stroke (age  of onset: 39) in his maternal grandmother.He reports that he has never smoked. He has never used smokeless tobacco. He reports current alcohol use of about 4.0 standard drinks of alcohol per week. He reports that he does not use drugs.  Outpatient Medications  Prior to Visit  Medication Sig Dispense Refill  . Ascorbic Acid (VITAMIN C) 1000 MG tablet Take 1,000 mg by mouth daily.     . cholecalciferol (VITAMIN D) 25 MCG (1000 UT) tablet Take 1,000 Units by mouth daily.     Marland Kitchen loratadine (CLARITIN) 10 MG tablet TAKE 1 TABLET BY MOUTH EVERY DAY FOR ALLERGIES (OTC NOT COVERED) 90 tablet 0  . Omega-3 Fatty Acids (FISH OIL) 1200 MG CAPS Take 2,400 mg by mouth daily.     . sildenafil (REVATIO) 20 MG tablet TAKE 1 TABLET BY MOUTH THREE TIMES A DAY 30 tablet 0  . simvastatin (ZOCOR) 20 MG tablet TAKE 1 TABLET BY MOUTH EVERYDAY AT BEDTIME 90 tablet 3  . tamsulosin (FLOMAX) 0.4 MG CAPS capsule TAKE 2 CAPSULE BY MOUTH EVERY DAY AFTER BREAKFAST 180 capsule 1  . Zinc 100 MG TABS Take 1 tablet by mouth daily.      No facility-administered medications prior to visit.    Review of Systems   Patient denies headache, fevers, malaise, unintentional weight loss, skin rash, eye pain, sinus congestion and sinus pain, sore throat, dysphagia,  hemoptysis , cough, dyspnea, wheezing, chest pain, palpitations, orthopnea, edema, abdominal pain, nausea, melena, diarrhea, constipation, flank pain, dysuria, hematuria, urinary  Frequency, nocturia, numbness, tingling, seizures,  Focal weakness, Loss of consciousness,  Tremor, insomnia, depression, anxiety, and suicidal ideation.     Objective:  BP 124/80 (BP Location: Left Arm, Patient Position: Sitting, Cuff Size: Normal)   Pulse 61   Temp 98.4 F (36.9 C) (Oral)   Resp 14   Ht 5\' 8"  (1.727 m)   Wt 183 lb 3.2 oz (83.1 kg)   SpO2 98%   BMI 27.86 kg/m   Physical Exam  General appearance: alert, cooperative and appears stated age Ears: normal TM's and external ear canals both ears Throat: lips, mucosa, and tongue normal; teeth and gums normal Neck: no adenopathy, no carotid bruit, supple, symmetrical, trachea midline and thyroid not enlarged, symmetric, no tenderness/mass/nodules Back: symmetric, no curvature. ROM  normal. No CVA tenderness. Lungs: clear to auscultation bilaterally Heart: regular rate and rhythm, S1, S2 normal, no murmur, click, rub or gallop Abdomen: soft, non-tender; bowel sounds normal; no masses,  no organomegaly Pulses: 2+ and symmetric Skin: Skin color, texture, turgor normal. No rashes or lesions Lymph nodes: Cervical, supraclavicular, and axillary nodes normal.  Assessment & Plan:   Problem List Items Addressed This Visit      Unprioritized   Visit for preventive health examination    age appropriate education and counseling updated, referrals for preventative services and immunizations addressed, dietary and smoking counseling addressed, most recent labs reviewed.  I have personally reviewed and have noted:  1) the patient's medical and social history 2) The pt's use of alcohol, tobacco, and illicit drugs 3) The patient's current medications and supplements 4) Functional ability including ADL's, fall risk, home safety risk, hearing and visual impairment 5) Diet and physical activities 6) Evidence for depression or mood disorder 7) The patient's height, weight, and BMI have been recorded in the chart  I have made referrals, and provided counseling and education based on review of the above      Prostate cancer ([place])  Diagnosed in 2019, managed with watchful waiting With  Most recent biopsies benign  But PSA remains > 4 ad will be repeated in June by Urology      Hyperlipidemia    LDL and triglycerides are at goal.   He has no side effects from simvastatin  And LFTS are normal   Lab Results  Component Value Date   CHOL 143 03/15/2021   HDL 55.90 03/15/2021   LDLCALC 73 03/15/2021   LDLDIRECT 79.0 12/30/2015   TRIG 74.0 03/15/2021   CHOLHDL 3 03/15/2021   Lab Results  Component Value Date   ALT 17 03/15/2021   AST 17 03/15/2021   ALKPHOS 67 03/15/2021   BILITOT 0.6 03/15/2021         Elevated blood pressure reading without diagnosis of hypertension     Repeat home and office readings have been normal.          Adenomatous polyp of colon    Colonoscopy done April 2021 yielded no polyps (path report:  Fecal material).  5 yr follow up in 2026 due to h/p polyps in 2010         I am having Braedan C. Glassberg maintain his Fish Oil, cholecalciferol, vitamin C, Zinc, sildenafil, tamsulosin, simvastatin, and loratadine.  No orders of the defined types were placed in this encounter.   There are no discontinued medications.  Follow-up: Return in about 6 months (around 09/21/2021).   Crecencio Mc, MD

## 2021-03-22 NOTE — Patient Instructions (Signed)
Your cholesterol is excellent, so are your other labs  Congratulations on your future retirement!  You will be due for pneumonia vaccine this winter,  And tetanus vaccine next year   I do recommend the COVID booster if you are going to travel   Health Maintenance After Age 68 After age 74, you are at a higher risk for certain long-term diseases and infections as well as injuries from falls. Falls are a major cause of broken bones and head injuries in people who are older than age 26. Getting regular preventive care can help to keep you healthy and well. Preventive care includes getting regular testing and making lifestyle changes as recommended by your health care provider. Talk with your health care provider about:  Which screenings and tests you should have. A screening is a test that checks for a disease when you have no symptoms.  A diet and exercise plan that is right for you. What should I know about screenings and tests to prevent falls? Screening and testing are the best ways to find a health problem early. Early diagnosis and treatment give you the best chance of managing medical conditions that are common after age 1. Certain conditions and lifestyle choices may make you more likely to have a fall. Your health care provider may recommend:  Regular vision checks. Poor vision and conditions such as cataracts can make you more likely to have a fall. If you wear glasses, make sure to get your prescription updated if your vision changes.  Medicine review. Work with your health care provider to regularly review all of the medicines you are taking, including over-the-counter medicines. Ask your health care provider about any side effects that may make you more likely to have a fall. Tell your health care provider if any medicines that you take make you feel dizzy or sleepy.  Osteoporosis screening. Osteoporosis is a condition that causes the bones to get weaker. This can make the bones weak  and cause them to break more easily.  Blood pressure screening. Blood pressure changes and medicines to control blood pressure can make you feel dizzy.  Strength and balance checks. Your health care provider may recommend certain tests to check your strength and balance while standing, walking, or changing positions.  Foot health exam. Foot pain and numbness, as well as not wearing proper footwear, can make you more likely to have a fall.  Depression screening. You may be more likely to have a fall if you have a fear of falling, feel emotionally low, or feel unable to do activities that you used to do.  Alcohol use screening. Using too much alcohol can affect your balance and may make you more likely to have a fall. What actions can I take to lower my risk of falls? General instructions  Talk with your health care provider about your risks for falling. Tell your health care provider if: ? You fall. Be sure to tell your health care provider about all falls, even ones that seem minor. ? You feel dizzy, sleepy, or off-balance.  Take over-the-counter and prescription medicines only as told by your health care provider. These include any supplements.  Eat a healthy diet and maintain a healthy weight. A healthy diet includes low-fat dairy products, low-fat (lean) meats, and fiber from whole grains, beans, and lots of fruits and vegetables. Home safety  Remove any tripping hazards, such as rugs, cords, and clutter.  Install safety equipment such as grab bars in bathrooms and safety  rails on stairs.  Keep rooms and walkways well-lit. Activity  Follow a regular exercise program to stay fit. This will help you maintain your balance. Ask your health care provider what types of exercise are appropriate for you.  If you need a cane or walker, use it as recommended by your health care provider.  Wear supportive shoes that have nonskid soles.   Lifestyle  Do not drink alcohol if your health  care provider tells you not to drink.  If you drink alcohol, limit how much you have: ? 0-1 drink a day for women. ? 0-2 drinks a day for men.  Be aware of how much alcohol is in your drink. In the [place] one drink equals one typical bottle of beer (12 oz), one-half glass of wine (5 oz), or one shot of hard liquor (1 oz).  Do not use any products that contain nicotine or tobacco, such as cigarettes and e-cigarettes. If you need help quitting, ask your health care provider. Summary  Having a healthy lifestyle and getting preventive care can help to protect your health and wellness after age 67.  Screening and testing are the best way to find a health problem early and help you avoid having a fall. Early diagnosis and treatment give you the best chance for managing medical conditions that are more common for people who are older than age 66.  Falls are a major cause of broken bones and head injuries in people who are older than age 41. Take precautions to prevent a fall at home.  Work with your health care provider to learn what changes you can make to improve your health and wellness and to prevent falls. This information is not intended to replace advice given to you by your health care provider. Make sure you discuss any questions you have with your health care provider. Document Revised: 03/12/2019 Document Reviewed: 10/02/2017 Elsevier Patient Education  2021 [employer].

## 2021-03-24 ENCOUNTER — Other Ambulatory Visit: Payer: Self-pay | Admitting: Internal Medicine

## 2021-03-24 ENCOUNTER — Telehealth: Payer: Self-pay | Admitting: Internal Medicine

## 2021-03-24 ENCOUNTER — Other Ambulatory Visit: Payer: Self-pay

## 2021-03-24 MED ORDER — SILDENAFIL CITRATE 20 MG PO TABS: 20.0000 mg | ORAL_TABLET | Freq: Three times a day (TID) | ORAL | 0 refills | Status: DC

## 2021-03-24 NOTE — Assessment & Plan Note (Signed)
Diagnosed in 2019, managed with watchful waiting With  Most recent biopsies benign  But PSA remains > 4 ad will be repeated in June by Urology

## 2021-03-24 NOTE — Telephone Encounter (Signed)
Spoke with pt to let him know that insurance does not cover this medication or any of the other medications used for ED. Pt was advised to use the goodrx card and to help him get it at a cheaper price. Pt gave a verbal understanding.

## 2021-03-24 NOTE — Assessment & Plan Note (Signed)

## 2021-03-24 NOTE — Assessment & Plan Note (Signed)
Repeat home and office readings have been normal.

## 2021-03-24 NOTE — Telephone Encounter (Signed)
Spoke with pt to let him know that insurance does not cover this medication or any of the other medications used for ED. Pt was advised to use the goodrx card and to help him get it at a cheaper price. Pt gave a verbal understanding.  

## 2021-03-24 NOTE — Assessment & Plan Note (Signed)
Colonoscopy done April 2021 yielded no polyps (path report:  Fecal material).  5 yr follow up in 2026 due to h/p polyps in 2010 

## 2021-03-24 NOTE — Assessment & Plan Note (Signed)
LDL and triglycerides are at goal.   He has no side effects from simvastatin  And LFTS are normal   Lab Results  Component Value Date   CHOL 143 03/15/2021   HDL 55.90 03/15/2021   LDLCALC 73 03/15/2021   LDLDIRECT 79.0 12/30/2015   TRIG 74.0 03/15/2021   CHOLHDL 3 03/15/2021   Lab Results  Component Value Date   ALT 17 03/15/2021   AST 17 03/15/2021   ALKPHOS 67 03/15/2021   BILITOT 0.6 03/15/2021

## 2021-03-24 NOTE — Telephone Encounter (Signed)
Patient would like a refill on his sildenafil (REVATIO) 20 MG tablet, BUT he needs another brand that his insurance will cover. Insurance will not cover sildenafil (REVATIO) 20 MG tablet. They will cover but the copay is over $100.

## 2021-03-24 NOTE — Telephone Encounter (Signed)
Medication was refilled.

## 2021-03-29 DIAGNOSIS — Z0100 Encounter for examination of eyes and vision without abnormal findings: Secondary | ICD-10-CM | POA: Diagnosis not present

## 2021-04-17 ENCOUNTER — Other Ambulatory Visit: Payer: Self-pay | Admitting: Urology

## 2021-04-17 DIAGNOSIS — C61 Malignant neoplasm of prostate: Secondary | ICD-10-CM

## 2021-04-24 ENCOUNTER — Other Ambulatory Visit: Payer: Self-pay | Admitting: Internal Medicine

## 2021-04-24 ENCOUNTER — Other Ambulatory Visit: Payer: Self-pay

## 2021-04-24 DIAGNOSIS — J302 Other seasonal allergic rhinitis: Secondary | ICD-10-CM

## 2021-04-24 MED ORDER — SIMVASTATIN 20 MG PO TABS: ORAL_TABLET | ORAL | 1 refills | Status: DC

## 2021-05-05 ENCOUNTER — Telehealth: Payer: Self-pay | Admitting: Internal Medicine

## 2021-05-05 NOTE — Telephone Encounter (Signed)
Patient called in wanted to know how much the lab test for antibody would cost with insurance and without insurance

## 2021-05-12 NOTE — Telephone Encounter (Signed)
The patient was informed to call [facility] to get estimate.

## 2021-05-18 ENCOUNTER — Other Ambulatory Visit: Payer: Self-pay

## 2021-05-18 ENCOUNTER — Other Ambulatory Visit: Payer: Medicare HMO

## 2021-05-18 DIAGNOSIS — C61 Malignant neoplasm of prostate: Secondary | ICD-10-CM | POA: Diagnosis not present

## 2021-05-19 LAB — PSA: Prostate Specific Ag, Serum: 4.2 ng/mL — ABNORMAL HIGH (ref 0.0–4.0)

## 2021-05-22 ENCOUNTER — Encounter: Payer: Self-pay | Admitting: *Deleted

## 2021-05-29 NOTE — Telephone Encounter (Signed)
Form has been printed out, completed and placed in quick sign folder.

## 2021-06-20 DIAGNOSIS — D2272 Melanocytic nevi of left lower limb, including hip: Secondary | ICD-10-CM | POA: Diagnosis not present

## 2021-06-20 DIAGNOSIS — L821 Other seborrheic keratosis: Secondary | ICD-10-CM | POA: Diagnosis not present

## 2021-06-20 DIAGNOSIS — X32XXXA Exposure to sunlight, initial encounter: Secondary | ICD-10-CM | POA: Diagnosis not present

## 2021-06-20 DIAGNOSIS — D2262 Melanocytic nevi of left upper limb, including shoulder: Secondary | ICD-10-CM | POA: Diagnosis not present

## 2021-06-20 DIAGNOSIS — D2261 Melanocytic nevi of right upper limb, including shoulder: Secondary | ICD-10-CM | POA: Diagnosis not present

## 2021-06-20 DIAGNOSIS — L570 Actinic keratosis: Secondary | ICD-10-CM | POA: Diagnosis not present

## 2021-06-20 DIAGNOSIS — D2271 Melanocytic nevi of right lower limb, including hip: Secondary | ICD-10-CM | POA: Diagnosis not present

## 2021-06-20 DIAGNOSIS — D225 Melanocytic nevi of trunk: Secondary | ICD-10-CM | POA: Diagnosis not present

## 2021-06-26 DIAGNOSIS — E782 Mixed hyperlipidemia: Secondary | ICD-10-CM

## 2021-06-26 DIAGNOSIS — Z9229 Personal history of other drug therapy: Secondary | ICD-10-CM

## 2021-08-01 ENCOUNTER — Other Ambulatory Visit: Payer: Self-pay

## 2021-08-01 DIAGNOSIS — J302 Other seasonal allergic rhinitis: Secondary | ICD-10-CM

## 2021-08-01 MED ORDER — LORATADINE 10 MG PO TABS: 10.0000 mg | ORAL_TABLET | Freq: Every day | ORAL | 1 refills | Status: DC

## 2021-08-30 DIAGNOSIS — L570 Actinic keratosis: Secondary | ICD-10-CM | POA: Diagnosis not present

## 2021-09-21 ENCOUNTER — Ambulatory Visit: Payer: Medicare HMO | Admitting: Internal Medicine

## 2021-09-22 ENCOUNTER — Other Ambulatory Visit: Payer: Self-pay | Admitting: Internal Medicine

## 2021-10-06 ENCOUNTER — Other Ambulatory Visit: Payer: Self-pay | Admitting: Internal Medicine

## 2021-10-06 DIAGNOSIS — J302 Other seasonal allergic rhinitis: Secondary | ICD-10-CM

## 2021-10-12 ENCOUNTER — Encounter: Payer: Self-pay | Admitting: Internal Medicine

## 2021-10-12 ENCOUNTER — Ambulatory Visit (INDEPENDENT_AMBULATORY_CARE_PROVIDER_SITE_OTHER): Payer: Medicare HMO | Admitting: Internal Medicine

## 2021-10-12 ENCOUNTER — Other Ambulatory Visit: Payer: Self-pay

## 2021-10-12 VITALS — BP 130/64 | HR 70 | Temp 96.2°F | Ht 68.0 in | Wt 179.4 lb

## 2021-10-12 DIAGNOSIS — E782 Mixed hyperlipidemia: Secondary | ICD-10-CM

## 2021-10-12 DIAGNOSIS — R030 Elevated blood-pressure reading, without diagnosis of hypertension: Secondary | ICD-10-CM | POA: Diagnosis not present

## 2021-10-12 DIAGNOSIS — C61 Malignant neoplasm of prostate: Secondary | ICD-10-CM | POA: Diagnosis not present

## 2021-10-12 LAB — LIPID PANEL
Cholesterol: 141 mg/dL (ref 0–200)
HDL: 45.8 mg/dL (ref >39.00)
LDL Cholesterol: 79 mg/dL (ref 0–99)
NonHDL: 94.7
Total CHOL/HDL Ratio: 3
Triglycerides: 79 mg/dL (ref 0.0–149.0)
VLDL: 15.8 mg/dL (ref 0.0–40.0)

## 2021-10-12 LAB — COMPREHENSIVE METABOLIC PANEL
ALT: 19 U/L (ref 0–53)
AST: 21 U/L (ref 0–37)
Albumin: 4.3 g/dL (ref 3.5–5.2)
Alkaline Phosphatase: 77 U/L (ref 39–117)
BUN: 19 mg/dL (ref 6–23)
CO2: 28 mEq/L (ref 19–32)
Calcium: 9.2 mg/dL (ref 8.4–10.5)
Chloride: 103 mEq/L (ref 96–112)
Creatinine, Ser: 0.81 mg/dL (ref 0.40–1.50)
GFR: 90.7 mL/min (ref >60.00)
Glucose, Bld: 97 mg/dL (ref 70–99)
Potassium: 3.9 mEq/L (ref 3.5–5.1)
Sodium: 137 mEq/L (ref 135–145)
Total Bilirubin: 0.6 mg/dL (ref 0.2–1.2)
Total Protein: 7 g/dL (ref 6.0–8.3)

## 2021-10-12 LAB — MICROALBUMIN / CREATININE URINE RATIO
Creatinine,U: 51.1 mg/dL
Microalb Creat Ratio: 1.4 mg/g (ref 0.0–30.0)
Microalb, Ur: <0.7 mg/dL (ref 0.0–1.9)

## 2021-10-12 NOTE — Assessment & Plan Note (Signed)
Diagnosed in 2019, managed with watchful waiting With  Most recent biopsies benign.  Repeat PSA improved ans is on annual repeat by Urology

## 2021-10-12 NOTE — Assessment & Plan Note (Addendum)
He has no  Microalbuminuria and reanl function is normal  Lab Results  Component Value Date   MICROALBUR <0.7 10/12/2021   Lab Results  Component Value Date   CREATININE 0.81 10/12/2021   Lab Results  Component Value Date   NA 137 10/12/2021   K 3.9 10/12/2021   CL 103 10/12/2021   CO2 28 10/12/2021     '

## 2021-10-12 NOTE — Assessment & Plan Note (Signed)
LDL and triglycerides are at goal.   He has no side effects from simvastatin  And LFTS are normal   Lab Results  Component Value Date   CHOL 141 10/12/2021   HDL 45.80 10/12/2021   LDLCALC 79 10/12/2021   LDLDIRECT 79.0 12/30/2015   TRIG 79.0 10/12/2021   CHOLHDL 3 10/12/2021   Lab Results  Component Value Date   ALT 19 10/12/2021   AST 21 10/12/2021   ALKPHOS 77 10/12/2021   BILITOT 0.6 10/12/2021

## 2021-10-12 NOTE — Patient Instructions (Signed)
You are doing well !  IF YOU ARE ONLY VOIDING ONCE AT NIGHT,  the daytime frequency may be due to caffeine    Return for a lab appt   in 6 months [place]

## 2021-10-12 NOTE — Progress Notes (Signed)
Subjective:  [name], male    DOB: 03-24-1951  Age: 70 y.o. MRN: [id number]  CC: The primary encounter diagnosis was Elevated blood pressure reading in office without diagnosis of hypertension. Diagnoses of Mixed hyperlipidemia, Prostate cancer ([place]), and Elevated blood pressure reading without diagnosis of hypertension were also pertinent to this visit.  HPI Edward Tran presents for follow up on hyperlipidemia , overweight.  And prostate CA Chief Complaint  Patient presents with   Follow-up   This visit occurred during the SARS-CoV-2 public health emergency.  Safety protocols were in place, including screening questions prior to the visit, additional usage of staff PPE, and extensive cleaning of exam room while observing appropriate contact time as indicated for disinfecting solutions.    He feels generally well,  is exercising regularly and taking medications as tolerated  He has resumed follow up with Dr Bernardo Heater with watchful waiting .  Last PSA was slightly lower in June .    He has doubled his tamsulosin dose ,  denies nocturia.      Outpatient Medications Prior to Visit  Medication Sig Dispense Refill   Ascorbic Acid (VITAMIN C) 1000 MG tablet Take 1,000 mg by mouth daily.      cholecalciferol (VITAMIN D) 25 MCG (1000 UT) tablet Take 1,000 Units by mouth daily.      loratadine (CLARITIN) 10 MG tablet TAKE 1 TABLET BY MOUTH EVERY DAY FOR ALLERGIES (OTC NOT COVERED) 90 tablet 1   MODERNA COVID-19 VACCINE 100 MCG/0.5ML injection      Omega-3 Fatty Acids (FISH OIL) 1200 MG CAPS Take 2,400 mg by mouth daily.      sildenafil (REVATIO) 20 MG tablet Take 1 tablet (20 mg total) by mouth 3 (three) times daily. 30 tablet 0   simvastatin (ZOCOR) 20 MG tablet TAKE 1 TABLET BY MOUTH EVERYDAY AT BEDTIME 90 tablet 1   tamsulosin (FLOMAX) 0.4 MG CAPS capsule TAKE 2 CAPSULES BY MOUTH EVERY DAY  AFTER  BREAKFAST 180 capsule 1   Zinc 100 MG TABS Take 1 tablet by mouth daily.       No facility-administered medications prior to visit.    Review of Systems;  Patient denies headache, fevers, malaise, unintentional weight loss, skin rash, eye pain, sinus congestion and sinus pain, sore throat, dysphagia,  hemoptysis , cough, dyspnea, wheezing, chest pain, palpitations, orthopnea, edema, abdominal pain, nausea, melena, diarrhea, constipation, flank pain, dysuria, hematuria, urinary  Frequency, nocturia, numbness, tingling, seizures,  Focal weakness, Loss of consciousness,  Tremor, insomnia, depression, anxiety, and suicidal ideation.      Objective:  BP 130/64   Pulse 70   Temp (!) 96.2 F (35.7 C) (Skin)   Ht 5\' 8"  (1.727 m)   Wt 179 lb 6.4 oz (81.4 kg)   SpO2 97%   BMI 27.28 kg/m   BP Readings from Last 3 Encounters:  10/12/21 130/64  03/22/21 124/80  02/15/21 (!) 160/75    Wt Readings from Last 3 Encounters:  10/12/21 179 lb 6.4 oz (81.4 kg)  03/22/21 183 lb 3.2 oz (83.1 kg)  02/15/21 175 lb (79.4 kg)    General appearance: alert, cooperative and appears stated age Ears: normal TM's and external ear canals both ears Throat: lips, mucosa, and tongue normal; teeth and gums normal Neck: no adenopathy, no carotid bruit, supple, symmetrical, trachea midline and thyroid not enlarged, symmetric, no tenderness/mass/nodules Back: symmetric, no curvature. ROM normal. No CVA tenderness. Lungs: clear to auscultation bilaterally Heart: regular rate  and rhythm, S1, S2 normal, no murmur, click, rub or gallop Abdomen: soft, non-tender; bowel sounds normal; no masses,  no organomegaly Pulses: 2+ and symmetric Skin: Skin color, texture, turgor normal. No rashes or lesions Lymph nodes: Cervical, supraclavicular, and axillary nodes normal.  Lab Results  Component Value Date   HGBA1C 5.5 02/05/2020    Lab Results  Component Value Date   CREATININE 0.81 10/12/2021   CREATININE 0.98 03/15/2021   CREATININE 0.87 02/05/2020    Lab Results  Component Value  Date   WBC 4.9 02/05/2020   HGB 15.0 02/05/2020   HCT 44.2 02/05/2020   PLT 188.0 02/05/2020   GLUCOSE 97 10/12/2021   CHOL 141 10/12/2021   TRIG 79.0 10/12/2021   HDL 45.80 10/12/2021   LDLDIRECT 79.0 12/30/2015   LDLCALC 79 10/12/2021   ALT 19 10/12/2021   AST 21 10/12/2021   NA 137 10/12/2021   K 3.9 10/12/2021   CL 103 10/12/2021   CREATININE 0.81 10/12/2021   BUN 19 10/12/2021   CO2 28 10/12/2021   TSH 4.17 02/05/2020   PSA 3.10 03/11/2020   HGBA1C 5.5 02/05/2020   MICROALBUR <0.7 10/12/2021    MR PROSTATE W WO CONTRAST  Result Date: 05/09/2020 CLINICAL DATA:  Small volume prostate cancer, Gleason score 6 involving 5% of 1 core in the left mid gland in 2019 EXAM: MR PROSTATE WITHOUT AND WITH CONTRAST TECHNIQUE: Multiplanar multisequence MRI images were obtained of the pelvis centered about the prostate. Pre and post contrast images were obtained. CONTRAST:  36mL GADAVIST GADOBUTROL 1 MMOL/ML IV SOLN COMPARISON:  None. FINDINGS: Prostate: No findings suspicious for high-grade macroscopic prostate cancer in the peripheral gland. Specifically, no focal low T2 lesion within the peripheral zone. No restricted diffusion/low ADC. No early arterial enhancement. PI-RADS 1. Enlargement/nodularity of the central gland, suggesting BPH, which indents the base of the bladder. No suspicious/smudgy central gland nodule on T2. Volume: 3.7 x 5.6 x 5.8 cm (volume = 63 mL) Transcapsular spread:  Absent. Seminal vesicle involvement: Absent. Neurovascular bundle involvement: Absent. Pelvic adenopathy: Absent. Bone metastasis: Absent. Other findings: Thick-walled, trabeculated bladder, suggesting chronic bladder outlet obstruction. IMPRESSION: No findings suspicious for high-grade macroscopic prostate cancer in this patient with known low volume, low-grade tumor. PI-RADS 1. No evidence of extracapsular extension, seminal vesicle invasion, lymphadenopathy, or metastatic disease. 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 Calculated prostate volume 63 mL. Electronically Signed   By: Julian Hy M.D.   On: 05/09/2020 11:09    Assessment & Plan:   Problem List Items Addressed This Visit     Hyperlipidemia    LDL and triglycerides are at goal.   He has no side effects from simvastatin  And LFTS are normal   Lab Results  Component Value Date   CHOL 141 10/12/2021   HDL 45.80 10/12/2021   LDLCALC 79 10/12/2021   LDLDIRECT 79.0 12/30/2015   TRIG 79.0 10/12/2021   CHOLHDL 3 10/12/2021   Lab Results  Component Value Date   ALT 19 10/12/2021   AST 21 10/12/2021   ALKPHOS 77 10/12/2021   BILITOT 0.6 10/12/2021         Relevant Orders   Comprehensive metabolic panel   Lipid panel   TSH   Prostate cancer ([place])    Diagnosed in 2019, managed with watchful waiting With  Most recent biopsies benign.  Repeat PSA improved ans is on annual repeat by Urology       Elevated blood pressure reading without  diagnosis of hypertension    He has no  Microalbuminuria and reanl function is normal  Lab Results  Component Value Date   MICROALBUR <0.7 10/12/2021   Lab Results  Component Value Date   CREATININE 0.81 10/12/2021   Lab Results  Component Value Date   NA 137 10/12/2021   K 3.9 10/12/2021   CL 103 10/12/2021   CO2 28 10/12/2021     '      Other Visit Diagnoses     Elevated blood pressure reading in office without diagnosis of hypertension    -  Primary   Relevant Orders   Microalbumin / creatinine urine ratio (Completed)       I am having Edward Tran maintain his Fish Oil, cholecalciferol, vitamin C, Zinc, sildenafil, tamsulosin, simvastatin, loratadine, and Moderna COVID-19 Vaccine.  No orders of the defined types were placed in this encounter.   There are no discontinued medications.  Follow-up: Return in about 6 months (around 04/11/2022).   Crecencio Mc, MD

## 2021-10-23 ENCOUNTER — Encounter: Payer: Self-pay | Admitting: Internal Medicine

## 2021-11-23 ENCOUNTER — Other Ambulatory Visit: Payer: Self-pay | Admitting: Urology

## 2021-11-23 DIAGNOSIS — C61 Malignant neoplasm of prostate: Secondary | ICD-10-CM

## 2021-11-28 ENCOUNTER — Encounter: Payer: Self-pay | Admitting: Internal Medicine

## 2021-12-08 ENCOUNTER — Encounter: Payer: Self-pay | Admitting: Internal Medicine

## 2021-12-08 MED ORDER — SILDENAFIL CITRATE 20 MG PO TABS: 20.0000 mg | ORAL_TABLET | Freq: Three times a day (TID) | ORAL | 0 refills | Status: DC

## 2022-01-02 ENCOUNTER — Other Ambulatory Visit: Payer: Self-pay

## 2022-01-02 ENCOUNTER — Ambulatory Visit: Payer: Medicare HMO | Admitting: Internal Medicine

## 2022-01-02 MED ORDER — SILDENAFIL CITRATE 20 MG PO TABS: 20.0000 mg | ORAL_TABLET | Freq: Three times a day (TID) | ORAL | 0 refills | Status: DC

## 2022-01-11 DIAGNOSIS — M7541 Impingement syndrome of right shoulder: Secondary | ICD-10-CM | POA: Diagnosis not present

## 2022-01-24 ENCOUNTER — Emergency Department
Admission: EM | Admit: 2022-01-24 | Discharge: 2022-01-25 | Disposition: A | Discharge status: Home / Self Care | Payer: Medicare HMO | Attending: Emergency Medicine | Admitting: Emergency Medicine

## 2022-01-24 ENCOUNTER — Other Ambulatory Visit: Payer: Self-pay

## 2022-01-24 ENCOUNTER — Emergency Department: Payer: Medicare HMO

## 2022-01-24 DIAGNOSIS — S01511A Laceration without foreign body of lip, initial encounter: Secondary | ICD-10-CM | POA: Insufficient documentation

## 2022-01-24 DIAGNOSIS — R739 Hyperglycemia, unspecified: Secondary | ICD-10-CM | POA: Diagnosis not present

## 2022-01-24 DIAGNOSIS — Y92002 Bathroom of unspecified non-institutional (private) residence single-family (private) house as the place of occurrence of the external cause: Secondary | ICD-10-CM | POA: Insufficient documentation

## 2022-01-24 DIAGNOSIS — F10129 Alcohol abuse with intoxication, unspecified: Secondary | ICD-10-CM | POA: Diagnosis not present

## 2022-01-24 DIAGNOSIS — R4020 Unspecified coma: Secondary | ICD-10-CM | POA: Diagnosis not present

## 2022-01-24 DIAGNOSIS — W010XXA Fall on same level from slipping, tripping and stumbling without subsequent striking against object, initial encounter: Secondary | ICD-10-CM | POA: Insufficient documentation

## 2022-01-24 DIAGNOSIS — W19XXXA Unspecified fall, initial encounter: Secondary | ICD-10-CM | POA: Diagnosis not present

## 2022-01-24 DIAGNOSIS — R9431 Abnormal electrocardiogram [ECG] [EKG]: Secondary | ICD-10-CM | POA: Diagnosis not present

## 2022-01-24 DIAGNOSIS — F10120 Alcohol abuse with intoxication, uncomplicated: Secondary | ICD-10-CM | POA: Insufficient documentation

## 2022-01-24 DIAGNOSIS — S0990XA Unspecified injury of head, initial encounter: Secondary | ICD-10-CM | POA: Diagnosis not present

## 2022-01-24 DIAGNOSIS — Y908 Blood alcohol level of 240 mg/100 ml or more: Secondary | ICD-10-CM | POA: Insufficient documentation

## 2022-01-24 DIAGNOSIS — F10920 Alcohol use, unspecified with intoxication, uncomplicated: Secondary | ICD-10-CM

## 2022-01-24 DIAGNOSIS — M50322 Other cervical disc degeneration at C5-C6 level: Secondary | ICD-10-CM | POA: Diagnosis not present

## 2022-01-24 DIAGNOSIS — E785 Hyperlipidemia, unspecified: Secondary | ICD-10-CM | POA: Insufficient documentation

## 2022-01-24 DIAGNOSIS — I10 Essential (primary) hypertension: Secondary | ICD-10-CM | POA: Diagnosis not present

## 2022-01-24 LAB — COMPREHENSIVE METABOLIC PANEL
ALT: 37 U/L (ref 0–44)
AST: 30 U/L (ref 15–41)
Albumin: 4.3 g/dL (ref 3.5–5.0)
Alkaline Phosphatase: 73 U/L (ref 38–126)
Anion gap: 11 (ref 5–15)
BUN: 23 mg/dL (ref 8–23)
CO2: 21 mmol/L — ABNORMAL LOW (ref 22–32)
Calcium: 8.9 mg/dL (ref 8.9–10.3)
Chloride: 106 mmol/L (ref 98–111)
Creatinine, Ser: 0.87 mg/dL (ref 0.61–1.24)
GFR, Estimated: >60 mL/min (ref >60)
Glucose, Bld: 130 mg/dL — ABNORMAL HIGH (ref 70–99)
Potassium: 3.3 mmol/L — ABNORMAL LOW (ref 3.5–5.1)
Sodium: 138 mmol/L (ref 135–145)
Total Bilirubin: 0.6 mg/dL (ref 0.3–1.2)
Total Protein: 6.8 g/dL (ref 6.5–8.1)

## 2022-01-24 LAB — CBG MONITORING, ED: Glucose-Capillary: 123 mg/dL — ABNORMAL HIGH (ref 70–99)

## 2022-01-24 LAB — CBC
HCT: 44.6 % (ref 39.0–52.0)
Hemoglobin: 15 g/dL (ref 13.0–17.0)
MCH: 31 pg (ref 26.0–34.0)
MCHC: 33.6 g/dL (ref 30.0–36.0)
MCV: 92.1 fL (ref 80.0–100.0)
Platelets: 162 10*3/uL (ref 150–400)
RBC: 4.84 MIL/uL (ref 4.22–5.81)
RDW: 13 % (ref 11.5–15.5)
WBC: 8.3 10*3/uL (ref 4.0–10.5)
nRBC: 0 % (ref 0.0–0.2)

## 2022-01-24 LAB — ETHANOL: Alcohol, Ethyl (B): 241 mg/dL — ABNORMAL HIGH (ref <10)

## 2022-01-24 MED ORDER — LIDOCAINE-EPINEPHRINE 2 %-1:200000 IJ SOLN
INTRAMUSCULAR | Status: AC
Start: 2022-01-24 — End: 2022-01-24
  Filled 2022-01-24: qty 20

## 2022-01-24 MED ORDER — LIDOCAINE HCL (PF) 1 % IJ SOLN
INTRAMUSCULAR | Status: AC
  Filled 2022-01-24: qty 5

## 2022-01-24 MED ORDER — AMOXICILLIN-POT CLAVULANATE 875-125 MG PO TABS: 1.0000 | ORAL_TABLET | Freq: Two times a day (BID) | ORAL | 0 refills | Status: AC

## 2022-01-24 MED ORDER — LIDOCAINE-EPINEPHRINE 2 %-1:100000 IJ SOLN
20.0000 mL | Freq: Once | INTRAMUSCULAR | Status: AC
  Administered 2022-01-24: 20 mL

## 2022-01-24 NOTE — ED Notes (Signed)
Pt continues to have repetitive questioning asking why he is here and what happened. Pt able to correctly give [place] his name, birthday, time, and place but disoriented to situation.

## 2022-01-24 NOTE — ED Triage Notes (Signed)
Pt presents to ER via [facility] from home.  Ems states pt has been drinking a lot of bourbon tonight.  Unable to state how much.  Pt reportedly had unwitnessed syncopal episode and fell.  Pt did fall on face and bit through his lower lip.  Pt has appx 2 cm through and through laceration to his lower lip.  Pt A&O x3 at this time.  Pt unable to state what happened.

## 2022-01-24 NOTE — ED Notes (Signed)
Pt returned from CT °

## 2022-01-24 NOTE — ED Notes (Signed)
Wife remains at bedside.

## 2022-01-24 NOTE — ED Notes (Signed)
Patient transported to CT 

## 2022-01-24 NOTE — ED Provider Notes (Signed)
[facility] Provider Note    Event Date/Time   First MD Initiated Contact with Patient 01/24/22 2201     (approximate)   History   Fall and Alcohol Intoxication   HPI  Edward Tran is a 71 y.o. male with a history of hyperlipidemia  He reports that this evening he had a quite bit of alcohol drink.  He does not drink alcohol on a regular basis or daily.  He had a friend that he was with, they were drinking shots of liquor.  His wife is with him reports that he got up to use the bathroom and as he did so he staggered stumbled and fell onto his face cutting his lip.  He also urinated on himself.  Patient reports at this point that he feels fairly embarrassed this happened, denies any injury except for his cut lip.  No neck pain no headache.  He is not any chest pain has not been recently ill.  He was with a friend he was drinking alcohol tonight much more than he typically does       Physical Exam   Triage Vital Signs: ED Triage Vitals  Enc Vitals Group     BP 01/24/22 2134 (!) 151/94     Pulse Rate 01/24/22 2134 86     Resp 01/24/22 2134 20     Temp 01/24/22 2134 97.6 F (36.4 C)     Temp Source 01/24/22 2134 Oral     SpO2 01/24/22 2134 98 %     Weight 01/24/22 2135 175 lb (79.4 kg)     Height 01/24/22 2135 5\' 8"  (1.727 m)     Head Circumference --      Peak Flow --      Pain Score 01/24/22 2134 0     Pain Loc --      Pain Edu? --      Excl. in [place]? --     Most recent vital signs: Vitals:   01/24/22 2300 01/24/22 2330  BP: 136/73 128/68  Pulse: 86 80  Resp: 14   Temp:    SpO2: 97% 94%     General: Awake, no distress.  Very pleasant.  Speech is just slightly slurred.  He and his wife are both very pleasant in no distress Cervical collar in place.  Patient has approximately 2 cm linear laceration below the right vermilion border and not involving the vermilion of the right lip.  It does have a mild through and through component with  small less than 1 cm laceration on the inner buccal mucosa.  Does not appear to be acutely contaminated.  It was irrigated and cleansed and closed. CV:  Good peripheral perfusion.  No murmurs rubs or gallops Resp:  Normal effort.  Clear lung sounds.  Speaks in full clear sentences with slight slurring of speech Abd:  No distention.  No abdominal pain to palpation Other:  Moves all extremities well no noted contusions abrasions or injuries to the extremities. Patient is alert well oriented, does not recall the fall very well, but was alert oriented to date place.  Speech is just slightly slurred   ED Results / Procedures / Treatments   Labs (all labs ordered are listed, but only abnormal results are displayed) Labs Reviewed  COMPREHENSIVE METABOLIC PANEL - Abnormal; Notable for the following components:      Result Value   Potassium 3.3 (*)    CO2 21 (*)    Glucose, Bld  130 (*)    All other components within normal limits  ETHANOL - Abnormal; Notable for the following components:   Alcohol, Ethyl (B) 241 (*)    All other components within normal limits  CBG MONITORING, ED - Abnormal; Notable for the following components:   Glucose-Capillary 123 (*)    All other components within normal limits  CBC     EKG  Reviewed inter by me at 2140 Heart rate 90 QRS 100 QTc 450 Sinus rhythm, no evidence of acute ischemia.  Mild inferior T wave abnormality denoted.  Patient denies any chest pain dyspnea or acute cardiopulmonary symptoms   RADIOLOGY CT HEAD WO CONTRAST (5MM)  IMPRESSION: 1. No CT evidence for acute intracranial abnormality. Mild atrophy and chronic small vessel ischemic changes of the white matter 2. Mild degenerative changes of the cervical spine. No acute osseous abnormality Electronically Signed   By: Donavan Foil M.D.   On: 01/24/2022 22:15     PROCEDURES:  Critical Care performed: No  Procedures  See procedure note for suture/laceration repair performed by  Skeet Simmer, [place] ED: Medications  lidocaine (PF) (XYLOCAINE) 1 % injection (  Not Given 01/24/22 2337)  lidocaine-EPINEPHrine (XYLOCAINE W/EPI) 2 %-1:100000 (with pres) injection 20 mL (20 mLs Infiltration Given by Other 01/24/22 2338)  lidocaine-EPINEPHrine (XYLOCAINE W/EPI) 2 %-1:200000 (PF) injection (  Given by Other 01/24/22 2338)     IMPRESSION / MDM / [place] / ED COURSE  I reviewed the triage vital signs and the nursing notes.                              Differential diagnosis includes, but is not limited to, fall, intracranial hemorrhage, cervical injury.  Patient reports fall in the setting of alcohol use.  Denies being a regular drinker, this evening it seems that he was with a friend and was drinking more alcohol than typical.  He suffered a witnessed fall when he got up to use the bathroom stumbled and struck his face on the ground.  Laceration of the right lip repaired with nonabsorbable sutures externally after irrigation and cleansing and then also a couple of dissolvable sutures internally.  There is no dental injury.  The jaw opens and closes normally.  There is no significant evidence of trauma to the remainder of the head neck torso or extremities.  Patient is fully alert oriented.  Does show evidence of mild alcohol intoxication to moderate intoxication at this time.  Will observe for improvement, and plan to discharge thereafter.  Laboratory analysis including metabolic panel and CBC are normal.  Notably elevated alcohol level  ----------------------------------------- 11:49 PM on 01/24/2022 ----------------------------------------- Ongoing care assigned to Dr. Leonides Schanz, reassess after further time for alcohol  to wear off. Anticipate dischareg to home with wife driving once clinically sober.  Patient and his wife both understand plan that he is not to drive while using alcohol, and also for suture removal needs in about 1  week   FINAL CLINICAL IMPRESSION(S) / ED DIAGNOSES   Final diagnoses:  Lip laceration, initial encounter  Alcoholic intoxication without complication ([place])  Fall, initial encounter     Rx / DC Orders   ED Discharge Orders          Ordered    amoxicillin-clavulanate (AUGMENTIN) 875-125 MG tablet  2 times daily        01/24/22 2350  Note:  This document was prepared using Dragon voice recognition software and may include unintentional dictation errors.   Delman Kitten, MD 01/24/22 2351

## 2022-01-24 NOTE — Discharge Instructions (Addendum)
Please follow-up with your primary care doctor or urgent care in about 7 days time for removal of your sutures of underneath your right lip.  Do not drive this evening or while using alcohol.   You may take Tylenol 1000 mg every 6 hours as needed for pain.

## 2022-01-24 NOTE — ED Provider Notes (Signed)
disregard   Delman Kitten, MD 01/24/22 2351

## 2022-01-25 ENCOUNTER — Telehealth: Payer: Self-pay

## 2022-01-25 ENCOUNTER — Telehealth: Payer: Self-pay | Admitting: Internal Medicine

## 2022-01-25 NOTE — ED Provider Notes (Signed)
1:50 AM  Assumed care at shift change.  Patient here after a fall while intoxicated.  CT head, cervical spine show no acute traumatic injury.  Lip laceration has been repaired.  Labs reassuring.  Alcohol level was 241.  Now clinically sober, ambulating without difficulty.  Denies any new pain.  Will discharge with his wife who is at the bedside.  At this time, I do not feel there is any life-threatening condition present. I reviewed all nursing notes, vitals, pertinent previous records.  All lab and urine results, EKGs, imaging ordered have been independently reviewed and interpreted by myself.  I reviewed all available radiology reports from any imaging ordered this visit.  Based on my assessment, I feel the patient is safe to be discharged home without further emergent workup and can continue workup as an outpatient as needed. Discussed all findings, treatment plan as well as usual and customary return precautions with patient and wife.  They verbalize understanding and are comfortable with this plan.  Outpatient follow-up has been provided as needed.  All questions have been answered.    Kallista Pae, Delice Bison, DO 01/25/22 [phone]

## 2022-01-25 NOTE — ED Notes (Signed)
Pt discharge information reviewed. Pt understands need for follow up care and when to return if symptoms worsen. All questions answered. Pt is alert and oriented with even and regular respirations. Pt is seen ambulating out of department with strong steady gait with wife

## 2022-01-25 NOTE — Telephone Encounter (Signed)
Appointment Request (Newest Message First) Edward Tran  Patient Appointment Schedule Request Pool 2 hours ago (10:11 AM)   Appointment Request From: Edward Tran   With Provider: Crecencio Mc, MD [facility] ]   Preferred Date Range: 02/01/2022 - 02/02/2022   Preferred Times: Any Time   Reason for visit: Office Visit   Comments: I had to get stitches under my lip and was told to have them removed in a week at your office. Thanks [employer]

## 2022-01-25 NOTE — Telephone Encounter (Signed)
Pt called in. Reschedule Pt appt to 02/01/2022 at 11am.

## 2022-01-25 NOTE — Telephone Encounter (Signed)
Lm for pt to cb re: appt

## 2022-01-31 DIAGNOSIS — M7541 Impingement syndrome of right shoulder: Secondary | ICD-10-CM | POA: Diagnosis not present

## 2022-02-01 ENCOUNTER — Ambulatory Visit (INDEPENDENT_AMBULATORY_CARE_PROVIDER_SITE_OTHER): Payer: Medicare HMO | Admitting: Internal Medicine

## 2022-02-01 ENCOUNTER — Encounter: Payer: Self-pay | Admitting: Internal Medicine

## 2022-02-01 ENCOUNTER — Other Ambulatory Visit: Payer: Self-pay

## 2022-02-01 VITALS — BP 140/80 | HR 75 | Temp 98.3°F | Ht 68.0 in | Wt 179.8 lb

## 2022-02-01 DIAGNOSIS — J302 Other seasonal allergic rhinitis: Secondary | ICD-10-CM | POA: Diagnosis not present

## 2022-02-01 DIAGNOSIS — E782 Mixed hyperlipidemia: Secondary | ICD-10-CM | POA: Diagnosis not present

## 2022-02-01 DIAGNOSIS — Z23 Encounter for immunization: Secondary | ICD-10-CM

## 2022-02-01 DIAGNOSIS — M7552 Bursitis of left shoulder: Secondary | ICD-10-CM | POA: Diagnosis not present

## 2022-02-01 DIAGNOSIS — I6782 Cerebral ischemia: Secondary | ICD-10-CM | POA: Diagnosis not present

## 2022-02-01 DIAGNOSIS — S01511A Laceration without foreign body of lip, initial encounter: Secondary | ICD-10-CM | POA: Diagnosis not present

## 2022-02-01 MED ORDER — ATORVASTATIN CALCIUM 20 MG PO TABS: 20.0000 mg | ORAL_TABLET | Freq: Every day | ORAL | 3 refills | Status: DC

## 2022-02-01 MED ORDER — LORATADINE 10 MG PO TABS: ORAL_TABLET | ORAL | 3 refills | Status: DC

## 2022-02-01 NOTE — Assessment & Plan Note (Addendum)
Reviewed CT done during ER visit . Recommend changing to high potency statin and adding baby asa once/twice weekly  ?

## 2022-02-01 NOTE — Assessment & Plan Note (Addendum)
tetanus booster given. Today.   4 vicryl Sutures were removed from patient's lower lip  without complication . There were so signs of infection or lack of inadequate hemostasis   ?

## 2022-02-01 NOTE — Progress Notes (Addendum)
Subjective:  [name], male    DOB: Apr 22, 1951  Age: 71 y.o. MRN: [id number]  CC: The primary encounter diagnosis was Lip laceration, initial encounter. Diagnoses of Mixed hyperlipidemia, Seasonal allergies, Ischemic changes on computed tomography of head, Laceration of lip, initial encounter, and Subacromial bursitis of left shoulder joint were also pertinent to this visit.   This visit occurred during the SARS-CoV-2 public health emergency.  Safety protocols were in place, including screening questions prior to the visit, additional usage of staff PPE, and extensive cleaning of exam room while observing appropriate contact time as indicated for disinfecting solutions.    HPI Edward Tran presents for suture removal and ER follow up.  Chief Complaint  Patient presents with   Follow-up    Follow up from ED to remove stitches from his lip    1) Fall with laceration to lip requiring sutures Feb 22 by ER physician.  No tetanus booster given per patientNO documentation of the number of nonabsorbable stitches placed externally.  2) LOC  secondary to fall. :  ,  CT head and spine were done.  Chronic small vessel ischemic changes and atrophy noted.  Denies any subequent headache or concussion symptoms.  Jaw sore.   3) shoulder pian,  left Re injured his left shoulder  during the fall (was being treated for tendonitis)   Went back to ortho ,  x ray done  no damage.  Given NSI, [place]  MR and and now getting PT   5) alcohol intoxification: unusual for patient.  Remains very embarassed about the episode which resulted in a fall and lip laceration     Outpatient Medications Prior to Visit  Medication Sig Dispense Refill   Ascorbic Acid (VITAMIN C) 1000 MG tablet Take 1,000 mg by mouth daily.      cholecalciferol (VITAMIN D) 25 MCG (1000 UT) tablet Take 1,000 Units by mouth daily.      MODERNA COVID-19 VACCINE 100 MCG/0.5ML injection      Omega-3 Fatty Acids (FISH OIL) 1200 MG  CAPS Take 2,400 mg by mouth daily.      sildenafil (REVATIO) 20 MG tablet Take 1 tablet (20 mg total) by mouth 3 (three) times daily. 30 tablet 0   tamsulosin (FLOMAX) 0.4 MG CAPS capsule TAKE 2 CAPSULES BY MOUTH EVERY DAY  AFTER  BREAKFAST 180 capsule 1   Zinc 100 MG TABS Take 1 tablet by mouth daily.      loratadine (CLARITIN) 10 MG tablet TAKE 1 TABLET BY MOUTH EVERY DAY FOR ALLERGIES (OTC NOT COVERED) 90 tablet 1   meloxicam (MOBIC) 15 MG tablet Take 15 mg by mouth 3 (three) times daily.     simvastatin (ZOCOR) 20 MG tablet TAKE 1 TABLET BY MOUTH EVERYDAY AT BEDTIME 90 tablet 1   No facility-administered medications prior to visit.    Review of Systems;  Patient denies headache, fevers, malaise, unintentional weight loss, skin rash, eye pain, sinus congestion and sinus pain, sore throat, dysphagia,  hemoptysis , cough, dyspnea, wheezing, chest pain, palpitations, orthopnea, edema, abdominal pain, nausea, melena, diarrhea, constipation, flank pain, dysuria, hematuria, urinary  Frequency, nocturia, numbness, tingling, seizures,  Focal weakness, Loss of consciousness,  Tremor, insomnia, depression, anxiety, and suicidal ideation.      Objective:  BP 140/80 (BP Location: Left Arm, Patient Position: Sitting, Cuff Size: Normal)    Pulse 75    Temp 98.3 F (36.8 C) (Oral)    Ht 5\' 8"  (1.727 m)  Wt 179 lb 12.8 oz (81.6 kg)    SpO2 97%    BMI 27.34 kg/m   BP Readings from Last 3 Encounters:  02/01/22 140/80  01/25/22 115/65  10/12/21 130/64    Wt Readings from Last 3 Encounters:  02/01/22 179 lb 12.8 oz (81.6 kg)  01/24/22 175 lb (79.4 kg)  10/12/21 179 lb 6.4 oz (81.4 kg)    General appearance: alert, cooperative and appears stated age Head:  NCAT  .  Right lower lip swollen,  internally with no sutures seen.  External sutures one visible,  thick black eschar covering any additional sutures (not clear how many were placed)  Ears: normal TM's and external ear canals both  ears Throat: lips, mucosa, and tongue normal; teeth and gums normal Neck: no adenopathy, no carotid bruit, supple, symmetrical, trachea midline and thyroid not enlarged, symmetric, no tenderness/mass/nodules Back: symmetric, no curvature. ROM normal. No CVA tenderness. Lungs: clear to auscultation bilaterally Heart: regular rate and rhythm, S1, S2 normal, no murmur, click, rub or gallop Abdomen: soft, non-tender; bowel sounds normal; no masses,  no organomegaly Pulses: 2+ and symmetric Skin: Skin color, texture, turgor normal. No rashes or lesions Lymph nodes: Cervical, supraclavicular, and axillary nodes normal.  Lab Results  Component Value Date   HGBA1C 5.5 02/05/2020    Lab Results  Component Value Date   CREATININE 0.87 01/24/2022   CREATININE 0.81 10/12/2021   CREATININE 0.98 03/15/2021    Lab Results  Component Value Date   WBC 8.3 01/24/2022   HGB 15.0 01/24/2022   HCT 44.6 01/24/2022   PLT 162 01/24/2022   GLUCOSE 130 (H) 01/24/2022   CHOL 141 10/12/2021   TRIG 79.0 10/12/2021   HDL 45.80 10/12/2021   LDLDIRECT 79.0 12/30/2015   LDLCALC 79 10/12/2021   ALT 37 01/24/2022   AST 30 01/24/2022   NA 138 01/24/2022   K 3.3 (L) 01/24/2022   CL 106 01/24/2022   CREATININE 0.87 01/24/2022   BUN 23 01/24/2022   CO2 21 (L) 01/24/2022   TSH 4.17 02/05/2020   PSA 3.10 03/11/2020   HGBA1C 5.5 02/05/2020   MICROALBUR <0.7 10/12/2021    CT HEAD WO CONTRAST (5MM)  Result Date: 01/24/2022 CLINICAL DATA:  Head trauma EXAM: CT HEAD WITHOUT CONTRAST CT CERVICAL SPINE WITHOUT CONTRAST TECHNIQUE: Multidetector CT imaging of the head and cervical spine was performed following the standard protocol without intravenous contrast. Multiplanar CT image reconstructions of the cervical spine were also generated. RADIATION DOSE REDUCTION: This exam was performed according to the departmental dose-optimization program which includes automated exposure control, adjustment of the mA and/or  kV according to patient size and/or use of iterative reconstruction technique. COMPARISON:  None. FINDINGS: CT HEAD FINDINGS Brain: No acute territorial infarction, hemorrhage or intracranial mass. Mild atrophy. Mild chronic small vessel ischemic changes of the white matter. Slightly prominent ventricles likely secondary to atrophy Vascular: No hyperdense vessels.  No unexpected calcification Skull: Normal. Negative for fracture or focal lesion. Sinuses/Orbits: Mucosal thickening in the paranasal sinuses. Other: None CT CERVICAL SPINE FINDINGS Alignment: Slightly limited by motion degradation. No subluxation. Facet alignment within normal limits Skull base and vertebrae: No acute fracture. No primary bone lesion or focal pathologic process. Soft tissues and spinal canal: No prevertebral fluid or swelling. No visible canal hematoma. Disc levels:  Mild degenerative changes C5-C6 and C6-C7 Upper chest: Negative. Other: None IMPRESSION: 1. No CT evidence for acute intracranial abnormality. Mild atrophy and chronic small vessel ischemic changes of the  white matter 2. Mild degenerative changes of the cervical spine. No acute osseous abnormality Electronically Signed   By: Donavan Foil M.D.   On: 01/24/2022 22:15   CT Cervical Spine Wo Contrast  Result Date: 01/24/2022 CLINICAL DATA:  Head trauma EXAM: CT HEAD WITHOUT CONTRAST CT CERVICAL SPINE WITHOUT CONTRAST TECHNIQUE: Multidetector CT imaging of the head and cervical spine was performed following the standard protocol without intravenous contrast. Multiplanar CT image reconstructions of the cervical spine were also generated. RADIATION DOSE REDUCTION: This exam was performed according to the departmental dose-optimization program which includes automated exposure control, adjustment of the mA and/or kV according to patient size and/or use of iterative reconstruction technique. COMPARISON:  None. FINDINGS: CT HEAD FINDINGS Brain: No acute territorial infarction,  hemorrhage or intracranial mass. Mild atrophy. Mild chronic small vessel ischemic changes of the white matter. Slightly prominent ventricles likely secondary to atrophy Vascular: No hyperdense vessels.  No unexpected calcification Skull: Normal. Negative for fracture or focal lesion. Sinuses/Orbits: Mucosal thickening in the paranasal sinuses. Other: None CT CERVICAL SPINE FINDINGS Alignment: Slightly limited by motion degradation. No subluxation. Facet alignment within normal limits Skull base and vertebrae: No acute fracture. No primary bone lesion or focal pathologic process. Soft tissues and spinal canal: No prevertebral fluid or swelling. No visible canal hematoma. Disc levels:  Mild degenerative changes C5-C6 and C6-C7 Upper chest: Negative. Other: None IMPRESSION: 1. No CT evidence for acute intracranial abnormality. Mild atrophy and chronic small vessel ischemic changes of the white matter 2. Mild degenerative changes of the cervical spine. No acute osseous abnormality Electronically Signed   By: Donavan Foil M.D.   On: 01/24/2022 22:15    Assessment & Plan:   Problem List Items Addressed This Visit     Hyperlipidemia    With small vessel ischemic changes noted on head CT Feb 2023.  Will change to high potency statin       Relevant Medications   atorvastatin (LIPITOR) 20 MG tablet   Ischemic changes on computed tomography of head    Reviewed CT done during ER visit . Recommend changing to high potency statin and adding baby asa once/twice weekly       Laceration of lip, initial encounter    tetanus booster given. Today.   4 vicryl Sutures were removed from patient's lower lip  without complication . There were so signs of infection or lack of inadequate hemostasis        Subacromial bursitis of left shoulder joint    Continue NSAID and PT       Other Visit Diagnoses     Lip laceration, initial encounter    -  Primary   Relevant Orders   Td : Tetanus/diphtheria >7yo Preservative   free (Completed)   Seasonal allergies       Relevant Medications   loratadine (CLARITIN) 10 MG tablet       I spent 30 minutes dedicated to the care of this patient on the date of this encounter to include pre-visit review of patient's medical history,  most recent imaging studies, Face-to-face time with the patient , and post visit ordering of testing and therapeutics.    Follow-up: Return in about 6 months (around 08/04/2022).   Crecencio Mc, MD

## 2022-02-01 NOTE — Assessment & Plan Note (Signed)
Continue NSAID and PT  ?

## 2022-02-01 NOTE — Patient Instructions (Addendum)
I am changing your simvastatin to atorvastatin given the changes noted on your head CT ? ?You received your ten year tetanus booster today ? ?Continue applying cool compresses on lip to reduce swelling .  15 minutes at a time.  Every few hours  ?

## 2022-02-01 NOTE — Assessment & Plan Note (Addendum)
With small vessel ischemic changes noted on head CT Feb 2023.  Will change to high potency statin  ?

## 2022-02-03 ENCOUNTER — Encounter: Payer: Self-pay | Admitting: Internal Medicine

## 2022-02-05 ENCOUNTER — Ambulatory Visit: Payer: Medicare HMO | Admitting: Internal Medicine

## 2022-02-05 MED ORDER — MELOXICAM 15 MG PO TABS: 15.0000 mg | ORAL_TABLET | Freq: Every day | ORAL | 2 refills | Status: DC

## 2022-02-06 NOTE — Addendum Note (Signed)
Addended by: Crecencio Mc on: 02/06/2022 09:08 PM ? ? Modules accepted: Level of Service ? ?

## 2022-02-22 DIAGNOSIS — M25511 Pain in right shoulder: Secondary | ICD-10-CM | POA: Diagnosis not present

## 2022-02-22 DIAGNOSIS — M7541 Impingement syndrome of right shoulder: Secondary | ICD-10-CM | POA: Diagnosis not present

## 2022-03-07 DIAGNOSIS — M7541 Impingement syndrome of right shoulder: Secondary | ICD-10-CM | POA: Diagnosis not present

## 2022-03-12 DIAGNOSIS — Z0100 Encounter for examination of eyes and vision without abnormal findings: Secondary | ICD-10-CM | POA: Diagnosis not present

## 2022-03-14 DIAGNOSIS — M7541 Impingement syndrome of right shoulder: Secondary | ICD-10-CM | POA: Diagnosis not present

## 2022-03-22 DIAGNOSIS — M7541 Impingement syndrome of right shoulder: Secondary | ICD-10-CM | POA: Diagnosis not present

## 2022-03-29 ENCOUNTER — Other Ambulatory Visit: Payer: Medicare HMO

## 2022-04-02 DIAGNOSIS — M75121 Complete rotator cuff tear or rupture of right shoulder, not specified as traumatic: Secondary | ICD-10-CM | POA: Diagnosis not present

## 2022-04-05 ENCOUNTER — Other Ambulatory Visit: Payer: Medicare HMO

## 2022-04-06 ENCOUNTER — Encounter: Payer: Medicare HMO | Admitting: Internal Medicine

## 2022-04-09 ENCOUNTER — Other Ambulatory Visit (INDEPENDENT_AMBULATORY_CARE_PROVIDER_SITE_OTHER): Payer: Medicare HMO

## 2022-04-09 ENCOUNTER — Encounter: Payer: Medicare HMO | Admitting: Internal Medicine

## 2022-04-09 DIAGNOSIS — E782 Mixed hyperlipidemia: Secondary | ICD-10-CM

## 2022-04-09 LAB — COMPREHENSIVE METABOLIC PANEL
ALT: 20 U/L (ref 0–53)
AST: 17 U/L (ref 0–37)
Albumin: 4.3 g/dL (ref 3.5–5.2)
Alkaline Phosphatase: 62 U/L (ref 39–117)
BUN: 17 mg/dL (ref 6–23)
CO2: 25 mEq/L (ref 19–32)
Calcium: 8.9 mg/dL (ref 8.4–10.5)
Chloride: 107 mEq/L (ref 96–112)
Creatinine, Ser: 0.83 mg/dL (ref 0.40–1.50)
GFR: 89.72 mL/min (ref >60.00)
Glucose, Bld: 94 mg/dL (ref 70–99)
Potassium: 4.1 mEq/L (ref 3.5–5.1)
Sodium: 139 mEq/L (ref 135–145)
Total Bilirubin: 0.6 mg/dL (ref 0.2–1.2)
Total Protein: 6.6 g/dL (ref 6.0–8.3)

## 2022-04-09 LAB — TSH: TSH: 2.83 u[IU]/mL (ref 0.35–5.50)

## 2022-04-09 LAB — LIPID PANEL
Cholesterol: 142 mg/dL (ref 0–200)
HDL: 53.1 mg/dL (ref >39.00)
LDL Cholesterol: 75 mg/dL (ref 0–99)
NonHDL: 89.37
Total CHOL/HDL Ratio: 3
Triglycerides: 70 mg/dL (ref 0.0–149.0)
VLDL: 14 mg/dL (ref 0.0–40.0)

## 2022-04-13 ENCOUNTER — Ambulatory Visit (INDEPENDENT_AMBULATORY_CARE_PROVIDER_SITE_OTHER): Payer: Medicare HMO | Admitting: Internal Medicine

## 2022-04-13 ENCOUNTER — Encounter: Payer: Self-pay | Admitting: Internal Medicine

## 2022-04-13 ENCOUNTER — Encounter: Payer: Medicare HMO | Admitting: Internal Medicine

## 2022-04-13 ENCOUNTER — Ambulatory Visit (INDEPENDENT_AMBULATORY_CARE_PROVIDER_SITE_OTHER): Payer: Medicare HMO

## 2022-04-13 VITALS — BP 120/68 | HR 67 | Temp 98.0°F | Ht 68.0 in | Wt 175.8 lb

## 2022-04-13 DIAGNOSIS — Z0000 Encounter for general adult medical examination without abnormal findings: Secondary | ICD-10-CM

## 2022-04-13 DIAGNOSIS — H0015 Chalazion left lower eyelid: Secondary | ICD-10-CM | POA: Diagnosis not present

## 2022-04-13 DIAGNOSIS — Z01818 Encounter for other preprocedural examination: Secondary | ICD-10-CM | POA: Diagnosis not present

## 2022-04-13 DIAGNOSIS — Z125 Encounter for screening for malignant neoplasm of prostate: Secondary | ICD-10-CM | POA: Diagnosis not present

## 2022-04-13 DIAGNOSIS — C61 Malignant neoplasm of prostate: Secondary | ICD-10-CM

## 2022-04-13 NOTE — Progress Notes (Signed)
[name], male    DOB: Mar 20, 1951  Age: 71 y.o. MRN: [id number] ? ?The patient is here for annual  PREVENTIVE examination and management of other chronic and acute problems. ?  ?The risk factors are reflected in the social history. ? ?The roster of all physicians providing medical care to patient - is listed in the Snapshot section of the chart. ? ?Activities of daily living:  The patient is 100% independent in all ADLs: dressing, toileting, feeding as well as independent mobility ? ?Home safety : The patient has smoke detectors in the home. They wear seatbelts.  There are no firearms at home. There is no violence in the home.  ? ?There is no risks for hepatitis, STDs or HIV. There is no   history of blood transfusion. They have no travel history to infectious disease endemic areas of the world. ? ?The patient has seen their dentist in the last six month. They have seen their eye doctor in the last year. They admit to slight hearing difficulty with regard to whispered voices and some television programs.  They have deferred audiologic testing in the last year.  They do not  have excessive sun exposure. Discussed the need for sun protection: hats, long sleeves and use of sunscreen if there is significant sun exposure.  ? ?Diet: the importance of a healthy diet is discussed. They do have a healthy diet. ? ?The benefits of regular aerobic exercise were discussed. he walks 4 times per week ,  20 minutes.  ? ?Depression screen: there are no signs or vegative symptoms of depression- irritability, change in appetite, anhedonia, sadness/tearfullness. ? ?Cognitive assessment: the patient manages all their financial and personal affairs and is actively engaged. They could relate day,date,year and events; recalled 2/3 objects at 3 minutes; performed clock-face test normally. ? ?The following portions of the patient's history were reviewed and updated as appropriate: allergies, current medications, past family  history, past medical history,  past surgical history, past social history  and problem list. ? ?Visual acuity was not assessed per patient preference since she has regular follow up with her ophthalmologist. Hearing and body mass index were assessed and reviewed.  ? ?During the course of the visit the patient was educated and counseled about appropriate screening and preventive services including : fall prevention , diabetes screening, nutrition counseling, colorectal cancer screening, and recommended immunizations.   ? ?CC: There were no encounter diagnoses. ? ?1) having right shoulder repair of rotator cuff.  infraspinatus atrophy, pitched baseball al through high school,  has had chronic shoulder pain.  By [place]. ? ?2)  walking 3.5 miles daily . 1 hour daily  ? ?3) exam exam done.  Has a stye bing treated  ? ?4) sees derm in July  ? ?5) hearing exam normal. ? ? ?History ?Edward Tran has a past medical history of Hyperlipidemia, Medical history non-contributory, Prostate cancer ([place]), and Umbilical hernia without obstruction and without gangrene (01/20/2017).  ? ?He has a past surgical history that includes Tonsillectomy and adenoidectomy; Colonoscopy (2016); Umbilical hernia repair (N/A, 11/17/2018); and Colonoscopy with propofol (N/A, 03/28/2020).  ? ?His family history includes COPD in his mother and sister; Cancer in his maternal grandmother and sister; Early death in his father; Stroke (age of onset: 41) in his mother; Stroke (age of onset: 60) in his maternal grandmother.He reports that he has never smoked. He has never used smokeless tobacco. He reports current alcohol use of about 4.0 standard drinks per week.  He reports that he does not use drugs. ? ?Outpatient Medications Prior to Visit  ?Medication Sig Dispense Refill  ? Ascorbic Acid (VITAMIN C) 1000 MG tablet Take 1,000 mg by mouth daily.     ? atorvastatin (LIPITOR) 20 MG tablet Take 1 tablet (20 mg total) by mouth daily. 90 tablet 3  ?  cholecalciferol (VITAMIN D) 25 MCG (1000 UT) tablet Take 1,000 Units by mouth daily.     ? loratadine (CLARITIN) 10 MG tablet TAKE 1 TABLET BY MOUTH EVERY DAY FOR ALLERGIES (OTC NOT COVERED) 90 tablet 3  ? MODERNA COVID-19 VACCINE 100 MCG/0.5ML injection     ? Omega-3 Fatty Acids (FISH OIL) 1200 MG CAPS Take 2,400 mg by mouth daily.     ? sildenafil (REVATIO) 20 MG tablet Take 1 tablet (20 mg total) by mouth 3 (three) times daily. 30 tablet 0  ? tamsulosin (FLOMAX) 0.4 MG CAPS capsule TAKE 2 CAPSULES BY MOUTH EVERY DAY  AFTER  BREAKFAST 180 capsule 1  ? Zinc 100 MG TABS Take 1 tablet by mouth daily.     ? meloxicam (MOBIC) 15 MG tablet Take 1 tablet (15 mg total) by mouth daily. 30 tablet 2  ? ?No facility-administered medications prior to visit.  ? ? ?Review of Systems ? ?Patient denies headache, fevers, malaise, unintentional weight loss, skin rash, eye pain, sinus congestion and sinus pain, sore throat, dysphagia,  hemoptysis , cough, dyspnea, wheezing, chest pain, palpitations, orthopnea, edema, abdominal pain, nausea, melena, diarrhea, constipation, flank pain, dysuria, hematuria, urinary  Frequency, nocturia, numbness, tingling, seizures,  Focal weakness, Loss of consciousness,  Tremor, insomnia, depression, anxiety, and suicidal ideation.   ? ? ?Objective:  ?BP 120/68 (BP Location: Left Arm, Patient Position: Sitting, Cuff Size: Normal)   Pulse 67   Temp 98 ?F (36.7 ?C) (Oral)   Ht '5\' 8"'$  (1.727 m)   Wt 175 lb 12.8 oz (79.7 kg)   SpO2 97%   BMI 26.73 kg/m?  ? ?Physical Exam ? ?General appearance: alert, cooperative and appears stated age ?Ears: normal TM's and external ear canals both ears ?Throat: lips, mucosa, and tongue normal; teeth and gums normal ?Neck: no adenopathy, no carotid bruit, supple, symmetrical, trachea midline and thyroid not enlarged, symmetric, no tenderness/mass/nodules ?Back: symmetric, no curvature. ROM normal. No CVA tenderness. ?Lungs: clear to auscultation bilaterally ?Heart:  regular rate and rhythm, S1, S2 normal, no murmur, click, rub or gallop ?Abdomen: soft, non-tender; bowel sounds normal; no masses,  no organomegaly ?Pulses: 2+ and symmetric ?Skin: Skin color, texture, turgor normal. No rashes or lesions ?Lymph nodes: Cervical, supraclavicular, and axillary nodes normal.  ? ?Assessment & Plan:  ? ?Problem List Items Addressed This Visit   ?None ? ? ?I have discontinued Karriem C. Linan's meloxicam. I am also having him maintain his Fish Oil, cholecalciferol, vitamin C, Zinc, Moderna COVID-19 Vaccine, tamsulosin, sildenafil, atorvastatin, and loratadine. ? ?No orders of the defined types were placed in this encounter. ? ? ?Medications Discontinued During This Encounter  ?Medication Reason  ? meloxicam (MOBIC) 15 MG tablet   ? ? ?Follow-up: No follow-ups on file. ? ? ?Crecencio Mc, MD ? ?

## 2022-04-13 NOTE — Assessment & Plan Note (Signed)
Preoperative exam was done today at his annual exam due to anticipated need prior to his right shoulder surgery planned by [facility] for May 30.   ? ?age appropriate education and counseling updated, referrals for preventative services and immunizations addressed, dietary and smoking counseling addressed, most recent labs reviewed.  I have personally reviewed and have noted: ?  ?1) the patient's medical and social history ?2) The pt's use of alcohol, tobacco, and illicit drugs ?3) The patient's current medications and supplements ?4) Functional ability including ADL's, fall risk, home safety risk, hearing and visual impairment ?5) Diet and physical activities ?6) Evidence for depression or mood disorder ?7) The patient's height, weight, and BMI have been recorded in the chart ?8) I have ordered and reviewed a 12 lead EKG and find that there are no acute changes and patient is in sinus rhythm.   ?  ?I have made referrals, and provided counseling and education based on review of the above ?

## 2022-04-13 NOTE — Patient Instructions (Signed)
Your labs look great!  Your cholesterol is excellent.  Continue the atorvastatin  ? ?The EKG and chest x ray were done today in anticipation of a request from Dr Donney Rankins for preoperative clearance ?

## 2022-04-15 DIAGNOSIS — Z0000 Encounter for general adult medical examination without abnormal findings: Secondary | ICD-10-CM | POA: Insufficient documentation

## 2022-04-15 NOTE — Assessment & Plan Note (Signed)

## 2022-04-15 NOTE — Assessment & Plan Note (Signed)
Diagnosed in 2019, managed with watchful waiting by [facility]. With  Most recent biopsies benign.  Repeat PSA improved ans is on annual repeat by Urology  ?

## 2022-04-18 ENCOUNTER — Ambulatory Visit (INDEPENDENT_AMBULATORY_CARE_PROVIDER_SITE_OTHER): Payer: Medicare HMO

## 2022-04-18 VITALS — Ht 68.0 in | Wt 175.0 lb

## 2022-04-18 DIAGNOSIS — Z0000 Encounter for general adult medical examination without abnormal findings: Secondary | ICD-10-CM | POA: Diagnosis not present

## 2022-04-18 NOTE — Progress Notes (Addendum)
Subjective:   Edward Tran is a 71 y.o. male who presents for an Initial Medicare Annual Wellness Visit.  Review of Systems    No ROS.  Medicare Wellness Virtual Visit.  Visual/audio telehealth visit, UTA vital signs.   See social history for additional risk factors.   Cardiac Risk Factors include: advanced age (>46mn, >>45women);male gender     Objective:    Today's Vitals   04/18/22 0825  Weight: 175 lb (79.4 kg)  Height: '5\' 8"'$  (14.270m)   Body mass index is 26.61 kg/m.     04/18/2022    8:30 AM 01/24/2022    9:37 PM 03/28/2020   10:15 AM 11/04/2018   11:54 AM  Advanced Directives  Does Patient Have a Medical Advance Directive? Yes No No No  Type of AParamedicof AHatchLiving will     Does patient want to make changes to medical advance directive? No - Patient declined     Copy of [facility]? No - copy requested     Would patient like information on creating a medical advance directive?    No - Patient declined    Current Medications (verified) Outpatient Encounter Medications as of 04/18/2022  Medication Sig   Ascorbic Acid (VITAMIN C) 1000 MG tablet Take 1,000 mg by mouth daily.    atorvastatin (LIPITOR) 20 MG tablet Take 1 tablet (20 mg total) by mouth daily.   cholecalciferol (VITAMIN D) 25 MCG (1000 UT) tablet Take 1,000 Units by mouth daily.    loratadine (CLARITIN) 10 MG tablet TAKE 1 TABLET BY MOUTH EVERY DAY FOR ALLERGIES (OTC NOT COVERED)   MODERNA COVID-19 VACCINE 100 MCG/0.5ML injection    Omega-3 Fatty Acids (FISH OIL) 1200 MG CAPS Take 2,400 mg by mouth daily.    sildenafil (REVATIO) 20 MG tablet Take 1 tablet (20 mg total) by mouth 3 (three) times daily.   tamsulosin (FLOMAX) 0.4 MG CAPS capsule TAKE 2 CAPSULES BY MOUTH EVERY DAY  AFTER  BREAKFAST   Zinc 100 MG TABS Take 1 tablet by mouth daily.    No facility-administered encounter medications on file as of 04/18/2022.    Allergies  (verified) Patient has no known allergies.   History: Past Medical History:  Diagnosis Date   Hyperlipidemia    Medical history non-contributory    [place]    low score   Umbilical hernia without obstruction and without gangrene 01/20/2017   Past Surgical History:  Procedure Laterality Date   COLONOSCOPY  2016   COLONOSCOPY WITH PROPOFOL N/A 03/28/2020   Procedure: COLONOSCOPY WITH PROPOFOL;  Surgeon: AJonathon Bellows MD;  [facility]  Service: Gastroenterology;  Laterality: N/A;   TONSILLECTOMY AND ADENOIDECTOMY     UMBILICAL HERNIA REPAIR N/A 11/17/2018   Procedure: HERNIA REPAIR UMBILICAL ADULT;  Surgeon: BRobert Bellow MD;  Location: [facility] ORS;  Service: General;  Laterality: N/A;   Family History  Problem Relation Age of Onset   Stroke Mother 675      brain aneurysm rupture    COPD Mother    Early death Father    Stroke Maternal Grandmother [phone]  Cancer Maternal Grandmother    Cancer Sister        cervical ca mets to lung    COPD Sister    Social History   Socioeconomic History   Marital status: Married    Spouse name: Not on file   Number of children: Not  on file   Years of education: Not on file   Highest education level: Not on file  Occupational History   Not on file  Tobacco Use   Smoking status: Never   Smokeless tobacco: Never  Vaping Use   Vaping Use: Never used  Substance and Sexual Activity   Alcohol use: Yes    Alcohol/week: 4.0 standard drinks    Types: 4 Cans of beer per week    Comment: moderate   Drug use: No   Sexual activity: Yes  Other Topics Concern   Not on file  Social History Narrative   Not on file   Social Determinants of Health   Financial Resource Strain: Low Risk    Difficulty of Paying Living Expenses: Not hard at all  Food Insecurity: No Food Insecurity   Worried About Charity fundraiser in the Last Year: Never true   [place] in the Last Year: Never true  Transportation Needs: No  Transportation Needs   Lack of Transportation (Medical): No   Lack of Transportation (Non-Medical): No  Physical Activity: Sufficiently Active   Days of Exercise per Week: 5 days   Minutes of Exercise per Session: 60 min  Stress: No Stress Concern Present   Feeling of Stress : Not at all  Social Connections: Unknown   Frequency of Communication with Friends and Family: Not on file   Frequency of Social Gatherings with Friends and Family: Not on file   Attends Religious Services: Not on Electrical engineer or Organizations: Not on file   Attends Archivist Meetings: Not on file   Marital Status: Married    Tobacco Counseling Counseling given: Not Answered   Clinical Intake:  Pre-visit preparation completed: Yes        Diabetes: No  How often do you need to have someone help you when you read instructions, pamphlets, or other written materials from your doctor or pharmacy?: 1 - Never  Interpreter Needed?: No    Activities of Daily Living    04/18/2022    8:30 AM  In your present state of health, do you have any difficulty performing the following activities:  Hearing? 0  Vision? 0  Difficulty concentrating or making decisions? 0  Walking or climbing stairs? 0  Dressing or bathing? 0  Doing errands, shopping? 0  Preparing Food and eating ? N  Using the Toilet? N  In the past six months, have you accidently leaked urine? N  Do you have problems with loss of bowel control? N  Managing your Medications? N  Managing your Finances? N  Housekeeping or managing your Housekeeping? N    Patient Care Team: Crecencio Mc, MD as PCP - General (Internal Medicine)  Indicate any recent Medical Services you may have received from other than [facility] providers in the past year (date may be approximate).     Assessment:   This is a routine wellness examination for Edward Tran.  Virtual Visit via Telephone Note  I connected with  Edward Tran on 04/18/22 at   8:15 AM EDT by telephone and verified that I am speaking with the correct person using two identifiers.  Persons participating in the virtual visit: patient/Nurse Health Advisor   I discussed the limitations of performing an evaluation and management service by telehealth. We continued and completed visit with audio only. Some vital signs may be absent or patient reported.   Hearing/Vision screen Hearing Screening - Comments::  Patient is able to hear conversational tones without difficulty.  No issues reported. Vision Screening - Comments:: Wears corrective lenses They have seen their ophthalmologist in the last 12 months.    Dietary issues and exercise activities discussed: Current Exercise Habits: Home exercise routine, Frequency (Times/Week): 5, Intensity: Intense   Goals Addressed             This Visit's Progress    Maintain Healthy Lifestyle       Stay active Healthy lifestyle       Depression Screen    04/18/2022    8:29 AM 04/13/2022   10:04 AM 02/01/2022   11:06 AM 10/12/2021   11:40 AM 03/22/2021   10:12 AM 02/08/2020    4:32 PM 01/28/2018    3:25 PM  PHQ 2/9 Scores  PHQ - 2 Score 0 0 0 0 0 0 0  PHQ- 9 Score     0 0 0    Fall Risk    04/18/2022    9:32 AM 04/13/2022   10:04 AM 02/01/2022   11:06 AM 10/12/2021   11:40 AM 03/22/2021   10:12 AM  Fall Risk   Falls in the past year?  1 1 0 0  Number falls in past yr:  0 0 0   Injury with Fall?  1 1 0   Risk for fall due to :  History of fall(s) History of fall(s) No Fall Risks   Follow up Falls evaluation completed Falls evaluation completed Falls evaluation completed Falls evaluation completed Falls evaluation completed    [place]: Home free of loose throw rugs in walkways, pet beds, electrical cords, etc? Yes  Adequate lighting in your home to reduce risk of falls? Yes   ASSISTIVE DEVICES UTILIZED TO PREVENT FALLS: Life alert? No  Use of a cane, walker or w/c? No   TIMED  UP AND GO: Was the test performed? No .   Cognitive Function:  Patient is alert and oriented x3.       Immunizations Immunization History  Administered Date(s) Administered   Fluad Quad(high Dose 65+) 09/27/2021   Influenza Split 10/27/2014   Influenza, High Dose Seasonal PF 09/20/2020   Influenza, Quadrivalent, Recombinant, Inj, Pf 09/09/2019   Influenza-Unspecified 09/28/2013, 10/03/2014, 09/29/2015, 09/26/2017, 09/11/2018, 09/08/2020   Moderna Covid-19 Vaccine Bivalent Booster 79yr & up 10/03/2021   Moderna Sars-Covid-2 Vaccination 06/26/2021   PFIZER(Purple Top)SARS-COV-2 Vaccination 01/07/2020, 02/01/2020, 09/09/2020   Pneumococcal Conjugate-13 10/13/2020   Td 02/01/2022   Tdap 10/22/2012   Zoster Recombinat (Shingrix) 09/08/2020, 01/10/2021   Zoster, Live 11/02/2013   Screening Tests Health Maintenance  Topic Date Due   Pneumonia Vaccine 69 Years old (2 - PPSV23 if available, else PCV20) 04/19/2023 (Originally 10/13/2021)   INFLUENZA VACCINE  07/03/2022   COLONOSCOPY (Pts 45-446yrInsurance coverage will need to be confirmed)  03/28/2025   TETANUS/TDAP  02/02/2032   COVID-19 Vaccine  Completed   Hepatitis C Screening  Completed   Zoster Vaccines- Shingrix  Completed   HPV VACCINES  Aged Out   Health Maintenance There are no preventive care reminders to display for this patient.  Lung Cancer Screening: (Low Dose CT Chest recommended if Age 69-80ears, 30 pack-year currently smoking OR have quit w/in 15years.) does not qualify.   Vision Screening: Recommended annual ophthalmology exams for early detection of glaucoma and other disorders of the eye.  Dental Screening: Recommended annual dental exams for proper oral hygiene  Community Resource Referral /  Chronic Care Management: CRR required this visit?  No   CCM required this visit?  No      Plan:   Keep all routine maintenance appointments.   I have personally reviewed and noted the following in the  patient's chart:   Medical and social history Use of alcohol, tobacco or illicit drugs  Current medications and supplements including opioid prescriptions. Patient is not currently taking opioid prescriptions. Functional ability and status Nutritional status Physical activity Advanced directives List of other physicians Hospitalizations, surgeries, and ER visits in previous 12 months Vitals Screenings to include cognitive, depression, and falls Referrals and appointments  In addition, I have reviewed and discussed with patient certain preventive protocols, quality metrics, and best practice recommendations. A written personalized care plan for preventive services as well as general preventive health recommendations were provided to patient.     OBrien-Blaney, Jaskiran Pata L, LPN   6/75/9163     I have reviewed the above information and agree with above.   Deborra Medina, MD

## 2022-04-18 NOTE — Patient Instructions (Addendum)
?  Edward Tran , ?Thank you for taking time to come for your Medicare Wellness Visit. I appreciate your ongoing commitment to your health goals. Please review the following plan we discussed and let me know if I can assist you in the future.  ? ?These are the goals we discussed: ? Goals   ? ?  Maintain Healthy Lifestyle   ?  Stay active ?Healthy lifestyle ?  ? ?  ?  ?This is a list of the screening recommended for you and due dates:  ?Health Maintenance  ?Topic Date Due  ? Pneumonia Vaccine (2 - PPSV23 if available, else PCV20) 04/19/2023*  ? Flu Shot  07/03/2022  ? Colon Cancer Screening  03/28/2025  ? Tetanus Vaccine  02/02/2032  ? COVID-19 Vaccine  Completed  ? Hepatitis C Screening: USPSTF Recommendation to screen - Ages 63-79 yo.  Completed  ? Zoster (Shingles) Vaccine  Completed  ? HPV Vaccine  Aged Out  ?*Topic was postponed. The date shown is not the original due date.  ?  ?

## 2022-04-24 ENCOUNTER — Other Ambulatory Visit: Payer: Self-pay | Admitting: Orthopedic Surgery

## 2022-04-24 ENCOUNTER — Other Ambulatory Visit
Admission: RE | Admit: 2022-04-24 | Discharge: 2022-04-24 | Disposition: A | Discharge status: Home / Self Care | Payer: Medicare HMO | Source: Ambulatory Visit | Attending: Orthopedic Surgery | Admitting: Orthopedic Surgery

## 2022-04-24 NOTE — Patient Instructions (Addendum)
Your procedure is scheduled on: 05/01/22 - Tuesday Report to the Registration Desk on the 1st floor of the [facility]. To find out your arrival time, please call [phone] between 1PM - 3PM on: 04/30/22 - Monday If your arrival time is 6:00 am, do not arrive prior to that time as the [facility] entrance doors do not open until 6:00 am.  REMEMBER: Instructions that are not followed completely may result in serious medical risk, up to and including death; or upon the discretion of your surgeon and anesthesiologist your surgery may need to be rescheduled.  Do not eat food or drink any liquids after midnight the night before surgery.  No gum chewing, lozengers or hard candies.  TAKE THESE MEDICATIONS THE MORNING OF SURGERY WITH A SIP OF WATER:  - tamsulosin (FLOMAX) 0.4 MG CAPS capsule  Stop taking one week prior to surgery beginning 04/24/22: Stop Anti-inflammatories (NSAIDS) such as Advil, Aleve, Ibuprofen, Motrin, Naproxen, Naprosyn and Aspirin based products such as Excedrin, Goodys Powder, BC Powder.  Stop ANY OVER THE COUNTER supplements until after surgery beginning 04/24/22.  You may take Tylenol if needed for pain up until the day of surgery.  No Alcohol for 24 hours before or after surgery.  No Smoking including e-cigarettes for 24 hours prior to surgery.  No chewable tobacco products for at least 6 hours prior to surgery.  No nicotine patches on the day of surgery.  Do not use any "recreational" drugs for at least a week prior to your surgery.  Please be advised that the combination of cocaine and anesthesia may have negative outcomes, up to and including death. If you test positive for cocaine, your surgery will be cancelled.  On the morning of surgery brush your teeth with toothpaste and water, you may rinse your mouth with mouthwash if you wish. Do not swallow any toothpaste or mouthwash.  Do not wear jewelry, make-up, hairpins, clips or nail polish.  Do not  wear lotions, powders, or perfumes.   Do not shave body from the neck down 48 hours prior to surgery just in case you cut yourself which could leave a site for infection.  Also, freshly shaved skin may become irritated if using the CHG soap.  Contact lenses, hearing aids and dentures may not be worn into surgery.  Do not bring valuables to the hospital. [facility] is not responsible for any missing/lost belongings or valuables.   Notify your doctor if there is any change in your medical condition (cold, fever, infection).  Wear comfortable clothing (specific to your surgery type) to the hospital.  After surgery, you can help prevent lung complications by doing breathing exercises.  Take deep breaths and cough every 1-2 hours. Your doctor may order a device called an Incentive Spirometer to help you take deep breaths. When coughing or sneezing, hold a pillow firmly against your incision with both hands. This is called "splinting." Doing this helps protect your incision. It also decreases belly discomfort.  If you are being admitted to the hospital overnight, leave your suitcase in the car. After surgery it may be brought to your room.  If you are being discharged the day of surgery, you will not be allowed to drive home. You will need a responsible adult (18 years or older) to drive you home and stay with you that night.   If you are taking public transportation, you will need to have a responsible adult (18 years or older) with you. Please confirm with  your physician that it is acceptable to use public transportation.   Please call the [place] Dept. at [phone] if you have any questions about these instructions.  Surgery Visitation Policy:  Patients undergoing a surgery or procedure may have two family members or support persons with them as long as the person is not COVID-19 positive or experiencing its symptoms.   Inpatient Visitation:    Visiting hours are 7  a.m. to 8 p.m. Up to four visitors are allowed at one time in a patient room, including children. The visitors may rotate out with other people during the day. One designated support person (adult) may remain overnight.

## 2022-04-25 DIAGNOSIS — M75121 Complete rotator cuff tear or rupture of right shoulder, not specified as traumatic: Secondary | ICD-10-CM | POA: Diagnosis not present

## 2022-04-27 DIAGNOSIS — M62830 Muscle spasm of back: Secondary | ICD-10-CM | POA: Diagnosis not present

## 2022-04-27 DIAGNOSIS — M5416 Radiculopathy, lumbar region: Secondary | ICD-10-CM | POA: Diagnosis not present

## 2022-04-27 DIAGNOSIS — M9903 Segmental and somatic dysfunction of lumbar region: Secondary | ICD-10-CM | POA: Diagnosis not present

## 2022-04-30 ENCOUNTER — Other Ambulatory Visit: Payer: Self-pay | Admitting: Orthopedic Surgery

## 2022-05-01 ENCOUNTER — Ambulatory Visit: Payer: Medicare HMO | Admitting: Certified Registered Nurse Anesthetist

## 2022-05-01 ENCOUNTER — Encounter
Admission: RE | Disposition: A | Discharge status: Home / Self Care | Payer: Self-pay | Source: Home / Self Care | Attending: Orthopedic Surgery

## 2022-05-01 ENCOUNTER — Other Ambulatory Visit: Payer: Self-pay

## 2022-05-01 ENCOUNTER — Ambulatory Visit: Payer: Medicare HMO

## 2022-05-01 ENCOUNTER — Ambulatory Visit
Admission: RE | Admit: 2022-05-01 | Discharge: 2022-05-01 | Disposition: A | Discharge status: Home / Self Care | Payer: Medicare HMO | Attending: Orthopedic Surgery | Admitting: Orthopedic Surgery

## 2022-05-01 ENCOUNTER — Encounter: Payer: Self-pay | Admitting: Orthopedic Surgery

## 2022-05-01 DIAGNOSIS — Z8546 Personal history of malignant neoplasm of prostate: Secondary | ICD-10-CM | POA: Insufficient documentation

## 2022-05-01 DIAGNOSIS — M75121 Complete rotator cuff tear or rupture of right shoulder, not specified as traumatic: Secondary | ICD-10-CM | POA: Diagnosis not present

## 2022-05-01 DIAGNOSIS — S46111A Strain of muscle, fascia and tendon of long head of biceps, right arm, initial encounter: Secondary | ICD-10-CM | POA: Diagnosis not present

## 2022-05-01 DIAGNOSIS — E785 Hyperlipidemia, unspecified: Secondary | ICD-10-CM | POA: Diagnosis not present

## 2022-05-01 DIAGNOSIS — X58XXXA Exposure to other specified factors, initial encounter: Secondary | ICD-10-CM | POA: Diagnosis not present

## 2022-05-01 DIAGNOSIS — M75101 Unspecified rotator cuff tear or rupture of right shoulder, not specified as traumatic: Secondary | ICD-10-CM | POA: Diagnosis not present

## 2022-05-01 DIAGNOSIS — Y939 Activity, unspecified: Secondary | ICD-10-CM | POA: Diagnosis not present

## 2022-05-01 DIAGNOSIS — M25511 Pain in right shoulder: Secondary | ICD-10-CM | POA: Diagnosis not present

## 2022-05-01 DIAGNOSIS — G8918 Other acute postprocedural pain: Secondary | ICD-10-CM | POA: Diagnosis not present

## 2022-05-01 HISTORY — PX: SHOULDER ARTHROSCOPY WITH ROTATOR CUFF REPAIR AND SUBACROMIAL DECOMPRESSION: SHX5686

## 2022-05-01 SURGERY — SHOULDER ARTHROSCOPY WITH ROTATOR CUFF REPAIR AND SUBACROMIAL DECOMPRESSION
Anesthesia: General | Laterality: Right

## 2022-05-01 MED ORDER — BUPIVACAINE LIPOSOME 1.3 % IJ SUSP
INTRAMUSCULAR | Status: AC
  Filled 2022-05-01: qty 10

## 2022-05-01 MED ORDER — ROCURONIUM BROMIDE 100 MG/10ML IV SOLN
INTRAVENOUS | Status: DC | PRN
  Administered 2022-05-01: 60 mg via INTRAVENOUS
  Administered 2022-05-01: 30 mg via INTRAVENOUS

## 2022-05-01 MED ORDER — FAMOTIDINE 20 MG PO TABS
ORAL_TABLET | ORAL | Status: AC
  Filled 2022-05-01: qty 1

## 2022-05-01 MED ORDER — ACETAMINOPHEN 500 MG PO TABS
ORAL_TABLET | ORAL | Status: AC
  Filled 2022-05-01: qty 2

## 2022-05-01 MED ORDER — GLYCOPYRROLATE 0.2 MG/ML IJ SOLN
INTRAMUSCULAR | Status: AC
  Filled 2022-05-01: qty 1

## 2022-05-01 MED ORDER — ORAL CARE MOUTH RINSE: 15.0000 mL | Freq: Once | OROMUCOSAL | Status: AC

## 2022-05-01 MED ORDER — FAMOTIDINE 20 MG PO TABS
20.0000 mg | ORAL_TABLET | Freq: Once | ORAL | Status: AC
  Administered 2022-05-01: 20 mg via ORAL

## 2022-05-01 MED ORDER — FENTANYL CITRATE (PF) 100 MCG/2ML IJ SOLN
INTRAMUSCULAR | Status: AC
  Filled 2022-05-01: qty 2

## 2022-05-01 MED ORDER — BUPIVACAINE LIPOSOME 1.3 % IJ SUSP
INTRAMUSCULAR | Status: DC | PRN
  Administered 2022-05-01: 10 mL via PERINEURAL

## 2022-05-01 MED ORDER — SUCCINYLCHOLINE CHLORIDE 200 MG/10ML IV SOSY
PREFILLED_SYRINGE | INTRAVENOUS | Status: AC
  Filled 2022-05-01: qty 10

## 2022-05-01 MED ORDER — PHENYLEPHRINE HCL-NACL 20-0.9 MG/250ML-% IV SOLN
INTRAVENOUS | Status: AC
  Filled 2022-05-01: qty 250

## 2022-05-01 MED ORDER — DEXAMETHASONE SODIUM PHOSPHATE 10 MG/ML IJ SOLN
INTRAMUSCULAR | Status: AC
  Filled 2022-05-01: qty 1

## 2022-05-01 MED ORDER — ACETAMINOPHEN 500 MG PO TABS
1000.0000 mg | ORAL_TABLET | ORAL | Status: AC
  Administered 2022-05-01: 1000 mg via ORAL

## 2022-05-01 MED ORDER — FENTANYL CITRATE (PF) 100 MCG/2ML IJ SOLN: 25.0000 ug | INTRAMUSCULAR | Status: DC | PRN

## 2022-05-01 MED ORDER — OXYCODONE HCL 5 MG PO TABS: 5.0000 mg | ORAL_TABLET | ORAL | 0 refills | Status: DC | PRN

## 2022-05-01 MED ORDER — GLYCOPYRROLATE 0.2 MG/ML IJ SOLN
INTRAMUSCULAR | Status: DC | PRN
  Administered 2022-05-01: .2 mg via INTRAVENOUS

## 2022-05-01 MED ORDER — LIDOCAINE HCL (PF) 2 % IJ SOLN
INTRAMUSCULAR | Status: AC
  Filled 2022-05-01: qty 5

## 2022-05-01 MED ORDER — FENTANYL CITRATE (PF) 100 MCG/2ML IJ SOLN
INTRAMUSCULAR | Status: DC | PRN
  Administered 2022-05-01 (×2): 50 ug via INTRAVENOUS

## 2022-05-01 MED ORDER — LIDOCAINE HCL (CARDIAC) PF 100 MG/5ML IV SOSY
PREFILLED_SYRINGE | INTRAVENOUS | Status: DC | PRN
  Administered 2022-05-01: 100 mg via INTRAVENOUS

## 2022-05-01 MED ORDER — ONDANSETRON HCL 4 MG/2ML IJ SOLN
INTRAMUSCULAR | Status: AC
  Filled 2022-05-01: qty 2

## 2022-05-01 MED ORDER — MIDAZOLAM HCL 2 MG/2ML IJ SOLN
1.0000 mg | Freq: Once | INTRAMUSCULAR | Status: AC
Start: 2022-05-01 — End: 2022-05-01
  Administered 2022-05-01: 1 mg via INTRAVENOUS

## 2022-05-01 MED ORDER — MIDAZOLAM HCL 2 MG/2ML IJ SOLN
INTRAMUSCULAR | Status: AC
  Administered 2022-05-01: 1 mg via INTRAVENOUS
  Filled 2022-05-01: qty 2

## 2022-05-01 MED ORDER — BUPIVACAINE HCL (PF) 0.5 % IJ SOLN
INTRAMUSCULAR | Status: AC
  Filled 2022-05-01: qty 20

## 2022-05-01 MED ORDER — EPINEPHRINE PF 1 MG/ML IJ SOLN
INTRAMUSCULAR | Status: AC
  Filled 2022-05-01: qty 4

## 2022-05-01 MED ORDER — EPHEDRINE 5 MG/ML INJ
INTRAVENOUS | Status: AC
  Filled 2022-05-01: qty 5

## 2022-05-01 MED ORDER — PROPOFOL 10 MG/ML IV BOLUS
INTRAVENOUS | Status: DC | PRN
  Administered 2022-05-01: 140 mg via INTRAVENOUS

## 2022-05-01 MED ORDER — DEXAMETHASONE SODIUM PHOSPHATE 10 MG/ML IJ SOLN
INTRAMUSCULAR | Status: DC | PRN
  Administered 2022-05-01: 8 mg via INTRAVENOUS

## 2022-05-01 MED ORDER — CHLORHEXIDINE GLUCONATE CLOTH 2 % EX PADS: 6.0000 | MEDICATED_PAD | Freq: Once | CUTANEOUS | Status: DC

## 2022-05-01 MED ORDER — ROCURONIUM BROMIDE 10 MG/ML (PF) SYRINGE
PREFILLED_SYRINGE | INTRAVENOUS | Status: AC
  Filled 2022-05-01: qty 10

## 2022-05-01 MED ORDER — SUGAMMADEX SODIUM 200 MG/2ML IV SOLN
INTRAVENOUS | Status: DC | PRN
  Administered 2022-05-01: 158.8 mg via INTRAVENOUS

## 2022-05-01 MED ORDER — BUPIVACAINE HCL (PF) 0.25 % IJ SOLN
INTRAMUSCULAR | Status: AC
  Filled 2022-05-01: qty 30

## 2022-05-01 MED ORDER — CEFAZOLIN SODIUM-DEXTROSE 2-4 GM/100ML-% IV SOLN
INTRAVENOUS | Status: AC
  Filled 2022-05-01: qty 100

## 2022-05-01 MED ORDER — ONDANSETRON HCL 4 MG/2ML IJ SOLN
INTRAMUSCULAR | Status: DC | PRN
  Administered 2022-05-01: 4 mg via INTRAVENOUS

## 2022-05-01 MED ORDER — CHLORHEXIDINE GLUCONATE 0.12 % MT SOLN
OROMUCOSAL | Status: AC
  Administered 2022-05-01: 15 mL via OROMUCOSAL
  Filled 2022-05-01: qty 15

## 2022-05-01 MED ORDER — PROPOFOL 10 MG/ML IV BOLUS
INTRAVENOUS | Status: AC
  Filled 2022-05-01: qty 40

## 2022-05-01 MED ORDER — EPHEDRINE SULFATE (PRESSORS) 50 MG/ML IJ SOLN
INTRAMUSCULAR | Status: DC | PRN
  Administered 2022-05-01: 5 mg via INTRAVENOUS
  Administered 2022-05-01: 10 mg via INTRAVENOUS

## 2022-05-01 MED ORDER — CHLORHEXIDINE GLUCONATE 0.12 % MT SOLN: 15.0000 mL | Freq: Once | OROMUCOSAL | Status: AC

## 2022-05-01 MED ORDER — BUPIVACAINE HCL (PF) 0.5 % IJ SOLN
INTRAMUSCULAR | Status: DC | PRN
  Administered 2022-05-01: 20 mL via PERINEURAL

## 2022-05-01 MED ORDER — MIDAZOLAM HCL 2 MG/2ML IJ SOLN
1.0000 mg | Freq: Once | INTRAMUSCULAR | Status: AC
Start: 2022-05-01 — End: 2022-05-01

## 2022-05-01 MED ORDER — LACTATED RINGERS IV SOLN: INTRAVENOUS | Status: DC

## 2022-05-01 MED ORDER — ONDANSETRON HCL 4 MG/2ML IJ SOLN
4.0000 mg | Freq: Once | INTRAMUSCULAR | Status: DC | PRN
Start: 2022-05-01 — End: 2022-05-01

## 2022-05-01 MED ORDER — PHENYLEPHRINE HCL-NACL 20-0.9 MG/250ML-% IV SOLN
INTRAVENOUS | Status: DC | PRN
  Administered 2022-05-01: 35 ug/min via INTRAVENOUS

## 2022-05-01 MED ORDER — LIDOCAINE HCL (PF) 1 % IJ SOLN
INTRAMUSCULAR | Status: AC
  Filled 2022-05-01: qty 30

## 2022-05-01 MED ORDER — CEFAZOLIN SODIUM-DEXTROSE 2-4 GM/100ML-% IV SOLN
2.0000 g | INTRAVENOUS | Status: AC
  Administered 2022-05-01: 2 g via INTRAVENOUS

## 2022-05-01 SURGICAL SUPPLY — 68 items
ADAPTER IRRIG TUBE 2 SPIKE SOL (ADAPTER) ×4 IMPLANT
CANNULA 5.75X7 CRYSTAL CLEAR (CANNULA) ×4 IMPLANT
CANNULA PARTIAL THREAD 2X7 (CANNULA) ×2 IMPLANT
CANNULA TWIST IN 8.25X9CM (CANNULA) ×3 IMPLANT
CONNECTOR PERFECT PASSER (CONNECTOR) ×3 IMPLANT
COOLER POLAR GLACIER W/PUMP (MISCELLANEOUS) ×2 IMPLANT
DEVICE SUCT BLK HOLE OR FLOOR (MISCELLANEOUS) ×4 IMPLANT
DRAPE 3/4 80X56 (DRAPES) ×2 IMPLANT
DRAPE INCISE IOBAN 66X45 STRL (DRAPES) ×2 IMPLANT
DRAPE U-SHAPE 47X51 STRL (DRAPES) ×2 IMPLANT
DRSG OPSITE POSTOP 3X4 (GAUZE/BANDAGES/DRESSINGS) ×1 IMPLANT
DRSG OPSITE POSTOP 4X6 (GAUZE/BANDAGES/DRESSINGS) ×1 IMPLANT
DURAPREP 26ML APPLICATOR (WOUND CARE) ×5 IMPLANT
ELECT REM PT RETURN 9FT ADLT (ELECTROSURGICAL) ×2
ELECTRODE REM PT RTRN 9FT ADLT (ELECTROSURGICAL) ×1 IMPLANT
GAUZE SPONGE 4X4 12PLY STRL (GAUZE/BANDAGES/DRESSINGS) ×2 IMPLANT
GAUZE XEROFORM 1X8 LF (GAUZE/BANDAGES/DRESSINGS) ×2 IMPLANT
GLOVE BIOGEL PI IND STRL 9 (GLOVE) ×1 IMPLANT
GLOVE BIOGEL PI INDICATOR 9 (GLOVE) ×1
GLOVE SURG ORTHO 9.0 STRL STRW (GLOVE) ×6 IMPLANT
GOWN STRL REUS TWL 2XL XL LVL4 (GOWN DISPOSABLE) ×2 IMPLANT
GOWN STRL REUS W/ TWL LRG LVL3 (GOWN DISPOSABLE) ×1 IMPLANT
GOWN STRL REUS W/TWL LRG LVL3 (GOWN DISPOSABLE) ×1
IV LACTATED RINGER IRRG 3000ML (IV SOLUTION) ×4
IV LR IRRIG 3000ML ARTHROMATIC (IV SOLUTION) ×8 IMPLANT
KIT STABILIZATION SHOULDER (MISCELLANEOUS) ×2 IMPLANT
KIT SUTURE 2.8 Q-FIX DISP (MISCELLANEOUS) ×2 IMPLANT
KIT SUTURETAK 3.0 INSERT PERC (KITS) IMPLANT
KIT TURNOVER KIT A (KITS) ×2 IMPLANT
MANIFOLD NEPTUNE II (INSTRUMENTS) ×3 IMPLANT
MASK FACE SPIDER DISP (MASK) ×2 IMPLANT
MAT ABSORB  FLUID 56X50 GRAY (MISCELLANEOUS) ×3
MAT ABSORB FLUID 56X50 GRAY (MISCELLANEOUS) ×3 IMPLANT
NDL SAFETY ECLIPSE 18X1.5 (NEEDLE) ×1 IMPLANT
NEEDLE HYPO 18GX1.5 SHARP (NEEDLE) ×1
NEEDLE HYPO 22GX1.5 SAFETY (NEEDLE) ×2 IMPLANT
NS IRRIG 500ML POUR BTL (IV SOLUTION) ×2 IMPLANT
PACK ARTHROSCOPY SHOULDER (MISCELLANEOUS) ×2 IMPLANT
PAD ABD DERMACEA PRESS 5X9 (GAUZE/BANDAGES/DRESSINGS) ×2 IMPLANT
PAD ARMBOARD 7.5X6 YLW CONV (MISCELLANEOUS) ×5 IMPLANT
PAD WRAPON POLAR SHDR XLG (MISCELLANEOUS) ×1 IMPLANT
PASSER SUT FIRSTPASS SELF (INSTRUMENTS) ×2 IMPLANT
SHAVER BLADE BONE CUTTER 4.5 (BLADE) ×2 IMPLANT
SHAVER BLADE TAPERED BLUNT 4 (BLADE) ×2 IMPLANT
SLING ARM LRG DEEP (SOFTGOODS) ×1 IMPLANT
SPONGE T-LAP 18X18 ~~LOC~~+RFID (SPONGE) ×2 IMPLANT
STRIP CLOSURE SKIN 1/2X4 (GAUZE/BANDAGES/DRESSINGS) ×2 IMPLANT
SUT ETHILON 4-0 (SUTURE) ×1
SUT ETHILON 4-0 FS2 18XMFL BLK (SUTURE) ×1
SUT LASSO 90 DEG SD STR (SUTURE) IMPLANT
SUT MNCRL 4-0 (SUTURE) ×1
SUT MNCRL 4-0 27XMFL (SUTURE) ×1
SUT PDS AB 0 CT1 27 (SUTURE) ×6 IMPLANT
SUT PERFECTPASSER WHITE CART (SUTURE) ×8 IMPLANT
SUT SMART STITCH CARTRIDGE (SUTURE) ×6 IMPLANT
SUT ULTRABRAID 2 COBRAID 38 (SUTURE) IMPLANT
SUT VIC AB 0 CT1 36 (SUTURE) ×6 IMPLANT
SUT VIC AB 2-0 CT2 27 (SUTURE) ×2 IMPLANT
SUTURE ETHLN 4-0 FS2 18XMF BLK (SUTURE) ×1 IMPLANT
SUTURE MNCRL 4-0 27XMF (SUTURE) ×1 IMPLANT
SYR 10ML LL (SYRINGE) ×2 IMPLANT
TAPE MICROFOAM 4IN (TAPE) ×2 IMPLANT
TUBING CONNECTING 10 (TUBING) ×2 IMPLANT
TUBING INFLOW SET DBFLO PUMP (TUBING) ×2 IMPLANT
TUBING OUTFLOW SET DBLFO PUMP (TUBING) ×2 IMPLANT
WAND WEREWOLF FLOW 90D (MISCELLANEOUS) ×2 IMPLANT
WATER STERILE IRR 500ML POUR (IV SOLUTION) ×2 IMPLANT
WRAPON POLAR PAD SHDR XLG (MISCELLANEOUS) ×2

## 2022-05-01 NOTE — Transfer of Care (Signed)
Immediate Anesthesia Transfer of Care Note  Patient: ALASTOR KNEALE  Procedure(s) Performed: SHOULDER ARTHROSCOPY WITH ROTATOR CUFF REPAIR AND SUBACROMIAL DECOMPRESSION, DISTAL CLAVICLE ACROMINECTOMY (Right)  Patient Location: PACU  Anesthesia Type:General  Level of Consciousness: drowsy  Airway & Oxygen Therapy: Patient Spontanous Breathing and Patient connected to face mask oxygen  Post-op Assessment: Report given to RN and Post -op Vital signs reviewed and stable  Post vital signs: Reviewed and stable  Last Vitals:  Vitals Value Taken Time  BP 115/71 05/01/22 0951  Temp    Pulse 51 05/01/22 0953  Resp 19 05/01/22 0953  SpO2 97 % 05/01/22 0953  Vitals shown include unvalidated device data.  Last Pain:  Vitals:   05/01/22 0630  TempSrc: Temporal  PainSc: 0-No pain      Patients Stated Pain Goal: 0 (86/48/47 2072)  Complications: No notable events documented.

## 2022-05-01 NOTE — Anesthesia Procedure Notes (Signed)
Procedure Name: Intubation Date/Time: 05/01/2022 8:05 AM Performed by: Demetrius Charity, CRNA Pre-anesthesia Checklist: Patient identified, Patient being monitored, Timeout performed, Emergency Drugs available and Suction available Patient Re-evaluated:Patient Re-evaluated prior to induction Oxygen Delivery Method: Circle system utilized Preoxygenation: Pre-oxygenation with 100% oxygen Induction Type: IV induction Ventilation: Mask ventilation without difficulty, Oral airway inserted - appropriate to patient size and Two handed mask ventilation required Laryngoscope Size: 3 and McGraph Grade View: Grade I Tube type: Oral Tube size: 7.0 mm Number of attempts: 1 Airway Equipment and Method: Stylet and Video-laryngoscopy Placement Confirmation: ETT inserted through vocal cords under direct vision, positive ETCO2 and breath sounds checked- equal and bilateral Secured at: 23 cm Tube secured with: Tape Dental Injury: Teeth and Oropharynx as per pre-operative assessment

## 2022-05-01 NOTE — H&P (Signed)
PREOPERATIVE H&P  Chief Complaint: Right Shoulder Rotator Cuff Tear  HPI: Edward Tran is a 71 y.o. male who presents for preoperative history and physical with a diagnosis of Right Shoulder Rotator Cuff Tear. Symptoms of pain, and weakness are significantly impairing activities of daily living.     Past Medical History:  Diagnosis Date   Hyperlipidemia    Medical history non-contributory    Prostate cancer ([place])    low score   Umbilical hernia without obstruction and without gangrene 01/20/2017   Past Surgical History:  Procedure Laterality Date   COLONOSCOPY  2016   COLONOSCOPY W/ POLYPECTOMY     COLONOSCOPY WITH PROPOFOL N/A 03/28/2020   Procedure: COLONOSCOPY WITH PROPOFOL;  Surgeon: Jonathon Bellows, MD;  Location: [facility] ENDOSCOPY;  Service: Gastroenterology;  Laterality: N/A;   TONSILLECTOMY     TONSILLECTOMY AND ADENOIDECTOMY     UMBILICAL HERNIA REPAIR N/A 11/17/2018   Procedure: HERNIA REPAIR UMBILICAL ADULT;  Surgeon: Robert Bellow, MD;  Location: [facility] ORS;  Service: General;  Laterality: N/A;   Social History   Socioeconomic History   Marital status: Married    Spouse name: [name]   Number of children: Not on file   Years of education: Not on file   Highest education level: Not on file  Occupational History   Not on file  Tobacco Use   Smoking status: Never   Smokeless tobacco: Never  Vaping Use   Vaping Use: Never used  Substance and Sexual Activity   Alcohol use: Yes    Alcohol/week: 4.0 standard drinks    Types: 4 Cans of beer per week    Comment: weekly   Drug use: No   Sexual activity: Yes  Other Topics Concern   Not on file  Social History Narrative   Not on file   Social Determinants of Health   Financial Resource Strain: Low Risk    Difficulty of Paying Living Expenses: Not hard at all  Food Insecurity: No Food Insecurity   Worried About Charity fundraiser in the Last Year: Never true   [place] in the Last Year: Never true   Transportation Needs: No Transportation Needs   Lack of Transportation (Medical): No   Lack of Transportation (Non-Medical): No  Physical Activity: Sufficiently Active   Days of Exercise per Week: 5 days   Minutes of Exercise per Session: 60 min  Stress: No Stress Concern Present   Feeling of Stress : Not at all  Social Connections: Unknown   Frequency of Communication with Friends and Family: Not on file   Frequency of Social Gatherings with Friends and Family: Not on file   Attends Religious Services: Not on Electrical engineer or Organizations: Not on file   Attends Archivist Meetings: Not on file   Marital Status: Married   Family History  Problem Relation Age of Onset   Stroke Mother 42       brain aneurysm rupture    COPD Mother    Early death Father    Stroke Maternal Grandmother 47   Cancer Maternal Grandmother    Cancer Sister        cervical ca mets to lung    COPD Sister    No Known Allergies Prior to Admission medications   Medication Sig Start Date End Date Taking? Authorizing Provider  Ascorbic Acid (VITAMIN C) 1000 MG tablet Take 1,000 mg by mouth daily.  12/07/19  Yes [provider]  atorvastatin (LIPITOR) 20 MG tablet Take 1 tablet (20 mg total) by mouth daily. 02/01/22  Yes Crecencio Mc, MD  cholecalciferol (VITAMIN D) 25 MCG (1000 UT) tablet Take 1,000 Units by mouth daily.    Yes [provider]  loratadine (CLARITIN) 10 MG tablet TAKE 1 TABLET BY MOUTH EVERY DAY FOR ALLERGIES (OTC NOT COVERED) 02/01/22  Yes Crecencio Mc, MD  Omega-3 Fatty Acids (FISH OIL) 1200 MG CAPS Take 2,400 mg by mouth daily.    Yes [provider]  sildenafil (REVATIO) 20 MG tablet Take 1 tablet (20 mg total) by mouth 3 (three) times daily. 01/02/22  Yes Crecencio Mc, MD  tamsulosin (FLOMAX) 0.4 MG CAPS capsule TAKE 2 CAPSULES BY MOUTH EVERY DAY  AFTER  BREAKFAST 11/23/21  Yes Stoioff, Ronda Fairly, MD  Zinc 100 MG TABS Take 1 tablet by  mouth daily.  12/07/19  Yes [provider]  MODERNA COVID-19 VACCINE 100 MCG/0.5ML injection  06/26/21   [provider]     Positive ROS: All other systems have been reviewed and were otherwise negative with the exception of those mentioned in the HPI and as above.  Physical Exam: General: Alert, no acute distress Cardiovascular: Regular rate and rhythm, no murmurs rubs or gallops.  No pedal edema Respiratory: Clear to auscultation bilaterally, no wheezes rales or rhonchi. No cyanosis, no use of accessory musculature GI: No organomegaly, abdomen is soft and non-tender nondistended with positive bowel sounds. Skin: Skin intact, no lesions within the operative field. Neurologic: Sensation intact distally Psychiatric: Patient is competent for consent with normal mood and affect Lymphatic: No cervical lymphadenopathy  MUSCULOSKELETAL: Right Shoulder: The patient can forward elevate and abduct to approximately 150 degrees. He has pain to a downward directed force on his abducted shoulder with 4+/5 abduction strength. The patient has significant external rotator weakness on exam today, but has no weakness to resisted shoulder internal rotation. He has positive impingement signs, but no apprehension or instability. He has full digital, wrist and elbow range of motion, intact sensation to light touch and a palpable radial pulse.   Radiology: I reviewed the MRI images as well as the radiology report. I gave the patient a copy of his written report and reviewed pertinent images from the MRI with him and his wife today. The patient has a large rotator cuff tear with complete retraction of the supra and infraspinatus insertions. The fibers are retracted to the glenoid. He has near full thickness interstitial and articular-sided tearing of the subscapularis insertion. He has moderate bursitis and mild-to-moderate muscle fatty atrophy seen most in the infraspinatus muscle. 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 He has elevation of the humeral head. There is a type 2 SLAP tear as well.   Assessment: Right Shoulder Rotator Cuff Tear  Plan: Plan for Procedure(s): RIGHT SHOULDER ARTHROSCOPIC SUBACROMIAL DECOMPRESSION, DISTAL CLAVICLE EXCISION AND ROTATOR CUFF REPAIR   I explained the details of the operation and the post-op course to the patient and his wife.  A pre-op history and physical was performed at the bedside.  I marked the right shoulder according to the hospital's correct site of surgery protocol.  I discussed the risks and benefits of surgery. The risks include but are not limited to infection, bleeding, nerve or blood vessel injury, joint stiffness or loss of motion, persistent pain, weakness or instability, re-tear of the rotator cuff, hardware failure and the need  for further surgery. We discussed the possibility that the rotator cuff tear may not be repairable and he may need a reverse total shoulder arthroplasty if the tear is irreparable.  Patient understood these risks and wished to proceed.     Thornton Park, MD   05/01/2022 7:47 AM

## 2022-05-01 NOTE — Anesthesia Procedure Notes (Signed)
Anesthesia Regional Block: Interscalene brachial plexus block   Pre-Anesthetic Checklist: , timeout performed,  Correct Patient, Correct Site, Correct Laterality,  Correct Procedure, Correct Position, site marked,  Risks and benefits discussed,  Surgical consent,  Pre-op evaluation,  At surgeon's request and post-op pain management  Laterality: Right  Prep: chloraprep       Needles:  Injection technique: Single-shot  Needle Type: Echogenic Stimulator Needle     Needle Length: 10cm  Needle Gauge: 20     Additional Needles:   Procedures:, nerve stimulator,,, ultrasound used (permanent image in chart),,    Narrative:  Start time: 05/01/2022 7:30 AM End time: 05/01/2022 7:40 AM Injection made incrementally with aspirations every 5 mL.  Performed by: Personally   Additional Notes: Functioning IV was confirmed and monitors were applied.  Sterile prep and drape,hand hygiene and sterile gloves were used.  Negative aspiration and negative test dose prior to incremental administration of local anesthetic. The patient tolerated the procedure well.

## 2022-05-01 NOTE — Discharge Instructions (Signed)
AMBULATORY SURGERY  ?DISCHARGE INSTRUCTIONS ? ? ?The drugs that you were given will stay in your system until tomorrow so for the next 24 hours you should not: ? ?Drive an automobile ?Make any legal decisions ?Drink any alcoholic beverage ? ? ?You may resume regular meals tomorrow.  Today it is better to start with liquids and gradually work up to solid foods. ? ?You may eat anything you prefer, but it is better to start with liquids, then soup and crackers, and gradually work up to solid foods. ? ? ?Please notify your doctor immediately if you have any unusual bleeding, trouble breathing, redness and pain at the surgery site, drainage, fever, or pain not relieved by medication. ? ? ? ?Additional Instructions: ? ? ? ?Please contact your physician with any problems or Same Day Surgery at [phone], Monday through Friday 6 am to 4 pm, or [place] [facility] [place] [facility] number at [phone].  ?

## 2022-05-01 NOTE — Anesthesia Preprocedure Evaluation (Signed)
Anesthesia Evaluation  Patient identified by MRN, date of birth, ID band Patient awake    Reviewed: Allergy & Precautions, H&P , NPO status , Patient's Chart, lab work & pertinent test results, reviewed documented beta blocker date and time   Airway Mallampati: III  TM Distance: >3 FB Neck ROM: full    Dental  (+) Teeth Intact   Pulmonary neg pulmonary ROS,    Pulmonary exam normal        Cardiovascular Exercise Tolerance: Good negative cardio ROS Normal cardiovascular exam Rhythm:regular Rate:Normal     Neuro/Psych negative neurological ROS  negative psych ROS   GI/Hepatic negative GI ROS, Neg liver ROS,   Endo/Other  negative endocrine ROS  Renal/GU negative Renal ROS  negative genitourinary   Musculoskeletal   Abdominal   Peds  Hematology negative hematology ROS (+)   Anesthesia Other Findings Past Medical History: No date: Hyperlipidemia No date: Medical history non-contributory No date: Prostate cancer ([place])     Comment:  low score 5/78/4696: Umbilical hernia without obstruction and without gangrene Past Surgical History: 2016: COLONOSCOPY No date: COLONOSCOPY W/ POLYPECTOMY 03/28/2020: COLONOSCOPY WITH PROPOFOL; N/A     Comment:  Procedure: COLONOSCOPY WITH PROPOFOL;  Surgeon: Jonathon Bellows, MD;  Location: [facility] ENDOSCOPY;  Service:               Gastroenterology;  Laterality: N/A; No date: TONSILLECTOMY No date: TONSILLECTOMY AND ADENOIDECTOMY 29/52/8413: UMBILICAL HERNIA REPAIR; N/A     Comment:  Procedure: HERNIA REPAIR UMBILICAL ADULT;  Surgeon:               Robert Bellow, MD;  Location: [facility] ORS;  Service:               General;  Laterality: N/A; BMI    Body Mass Index: 26.62 kg/m     Reproductive/Obstetrics negative OB ROS                             Anesthesia Physical Anesthesia Plan  ASA: 2  Anesthesia Plan: General ETT   Post-op Pain  Management: Regional block*   Induction:   PONV Risk Score and Plan: 3  Airway Management Planned:   Additional Equipment:   Intra-op Plan:   Post-operative Plan:   Informed Consent: I have reviewed the patients History and Physical, chart, labs and discussed the procedure including the risks, benefits and alternatives for the proposed anesthesia with the patient or authorized representative who has indicated his/her understanding and acceptance.     Dental Advisory Given  Plan Discussed with: CRNA  Anesthesia Plan Comments:         Anesthesia Quick Evaluation

## 2022-05-01 NOTE — Op Note (Signed)
05/01/2022  10:02 AM  PATIENT:  Edward Tran  71 y.o. male  PRE-OPERATIVE DIAGNOSIS:  Right Shoulder Rotator Cuff Tear  POST-OPERATIVE DIAGNOSIS:  Right Shoulder Rotator Cuff Tear, biceps tendon tear    PROCEDURE:  Procedure(s): RIGHT SHOULDER DIAGNOSTIC ARTHROSCOPY WITH DEBRIDEMENT  SURGEON:  Surgeon(s) and Role:    Thornton Park, MD - Primary  ANESTHESIA:   general and paracervical block   PREOPERATIVE INDICATIONS:  Edward Tran is a  71 y.o. male with a diagnosis of full-thickness and retracted rotator cuff tear confirmed by MRI who failed conservative treatment and elected for surgical management.    The risks benefits and alternatives were discussed with the patient preoperatively including but not limited to the risks of infection, bleeding, nerve injury, persistent pain or weakness, shoulder stiffness/arthrofibrosis, failure of the repair, re-tear of the rotator cuff and the need for further surgery. Medical risks include DVT and pulmonary embolism, myocardial infarction, stroke, pneumonia, respiratory failure and death. Patient understood these risks and wished to proceed.  OPERATIVE IMPLANTS: None  OPERATIVE FINDINGS: Patient had a large V-shaped tear of the rotator cuff with anterior displacement of the supraspinatus and retraction of the infraspinatus posteriorly.  The infraspinatus was encased in scar tissue.  The footprint of the supraspinatus and infraspinatus on the greater tuberosity was torn.  Patient had a complete and retracted tear of the long head of the biceps tendon and significant fraying without complete or retracted tear of the subscapularis.  Patient did not have detachment of the labrum.  Patient did not have significant chondral lesions of the glenoid or humeral head.  OPERATIVE PROCEDURE: The patient was met in the preoperative area. The right shoulder was signed with the word yes and my initials according the hospital's correct site of surgery  protocol.   A pre-op history and physical was performed at the bedside.   Patient underwent an interscalene block with Exparel by the anesthesia service.  Patient was brought to the operating room where he underwent general anesthesia.  The patient was placed in a beachchair position.  A spider arm positioner was used for this case.  Examination under anesthesia revealed no loss of passive range of motion.  Patient had mild increased anterior posterior translation but the humeral head could not be dislocated. The patient had a negative sulcus sign.  Patient was prepped and draped in a sterile fashion. A timeout was performed to verify the patient's name, date of birth, medical record number, correct site of surgery and correct procedure to be performed there was also used to verify the patient received antibiotics that all appropriate instruments, implants and radiographs studies were available in the room. Once all in attendance were in agreement case began.  Patient received ancef 2 grams IV for pre-op antibiotics.  Bony landmarks were drawn out with a surgical marker along with proposed arthroscopy incisions.  An 11 blade was used to establish a posterior portal through which the arthroscope was placed in the glenohumeral joint. A full diagnostic examination of the shoulder was performed.  Findings on arthroscopy are listed above.   A lateral portal was then created using an 18-gauge spinal needle.  The large retracted V-shaped rotator cuff tear was then examined using a Kingfisher grasper.  A second anterolateral portal was made so examination of the infraspinatus could be performed.  The rotator cuff tear was encased in scar.  A 90 degree [employer] werewolf wand and Dyon[employer] flyer shaver blade was used to debride  the scar tissue in an effort to mobilize the rotator cuff.  Despite extensive debridement the V-shaped tear could not be approximated.  The tear was deemed irrepairable, is no significant  coverage of the humeral head could be obtained.  I broke scrub and spoke to the patient's wife in the postop consultation room to let her know the findings on arthroscopy.  She understands that I do not feel the rotator cuff is repairable.  The patient's rotator cuff tear is a large retracted and chronic tear given the amount of scar tissue seen.  At this point the patient would be better served with a reverse total shoulder arthroplasty.  She understood and agreed with this plan.  I returned to the OR.  Skin closure for the arthroscopic incisions was then performed with 4-0 nylon.  Honeycomb dressings were placed over the Xeroform covering each of the 3 arthroscopy portals.  The patient was placed in a regular sling, not an abduction sling.  All sharp, sponge and it instrument counts were correct at the conclusion of the case. I was scrubbed and present for the entire case.

## 2022-05-02 ENCOUNTER — Encounter: Payer: Self-pay | Admitting: Orthopedic Surgery

## 2022-05-02 NOTE — Anesthesia Postprocedure Evaluation (Signed)
Anesthesia Post Note  Patient: ROMON DEVEREUX  Procedure(s) Performed: SHOULDER ARTHROSCOPY WITH ROTATOR CUFF REPAIR AND SUBACROMIAL DECOMPRESSION, DISTAL CLAVICLE ACROMINECTOMY (Right)  Patient location during evaluation: PACU Anesthesia Type: General Level of consciousness: awake and alert Pain management: pain level controlled Vital Signs Assessment: post-procedure vital signs reviewed and stable Respiratory status: spontaneous breathing, nonlabored ventilation, respiratory function stable and patient connected to nasal cannula oxygen Cardiovascular status: blood pressure returned to baseline and stable Postop Assessment: no apparent nausea or vomiting Anesthetic complications: no   No notable events documented.   Last Vitals:  Vitals:   05/01/22 1015 05/01/22 1053  BP: 126/70 122/71  Pulse:  77  Resp:  16  Temp:  (!) 36.1 C  SpO2: 94% 95%    Last Pain:  Vitals:   05/01/22 1053  TempSrc: Temporal  PainSc: 0-No pain                 Molli Barrows

## 2022-05-03 ENCOUNTER — Other Ambulatory Visit: Payer: Self-pay | Admitting: Family Medicine

## 2022-05-03 DIAGNOSIS — C61 Malignant neoplasm of prostate: Secondary | ICD-10-CM

## 2022-05-23 ENCOUNTER — Other Ambulatory Visit: Payer: Medicare HMO

## 2022-05-23 DIAGNOSIS — C61 Malignant neoplasm of prostate: Secondary | ICD-10-CM | POA: Diagnosis not present

## 2022-05-24 ENCOUNTER — Ambulatory Visit: Payer: Medicare HMO | Admitting: Urology

## 2022-05-24 ENCOUNTER — Encounter: Payer: Self-pay | Admitting: Urology

## 2022-05-24 VITALS — BP 147/80 | HR 81 | Ht 69.0 in | Wt 170.0 lb

## 2022-05-24 DIAGNOSIS — Z8546 Personal history of malignant neoplasm of prostate: Secondary | ICD-10-CM

## 2022-05-24 DIAGNOSIS — C61 Malignant neoplasm of prostate: Secondary | ICD-10-CM

## 2022-05-24 DIAGNOSIS — N401 Enlarged prostate with lower urinary tract symptoms: Secondary | ICD-10-CM | POA: Diagnosis not present

## 2022-05-24 LAB — PSA: Prostate Specific Ag, Serum: 3.7 ng/mL (ref 0.0–4.0)

## 2022-05-25 ENCOUNTER — Ambulatory Visit: Payer: Self-pay | Admitting: Urology

## 2022-05-28 DIAGNOSIS — M25511 Pain in right shoulder: Secondary | ICD-10-CM | POA: Diagnosis not present

## 2022-06-08 DIAGNOSIS — M75121 Complete rotator cuff tear or rupture of right shoulder, not specified as traumatic: Secondary | ICD-10-CM | POA: Diagnosis not present

## 2022-06-11 DIAGNOSIS — M5416 Radiculopathy, lumbar region: Secondary | ICD-10-CM | POA: Diagnosis not present

## 2022-06-11 DIAGNOSIS — M62830 Muscle spasm of back: Secondary | ICD-10-CM | POA: Diagnosis not present

## 2022-06-11 DIAGNOSIS — M9903 Segmental and somatic dysfunction of lumbar region: Secondary | ICD-10-CM | POA: Diagnosis not present

## 2022-06-20 DIAGNOSIS — L309 Dermatitis, unspecified: Secondary | ICD-10-CM | POA: Diagnosis not present

## 2022-06-20 DIAGNOSIS — D2271 Melanocytic nevi of right lower limb, including hip: Secondary | ICD-10-CM | POA: Diagnosis not present

## 2022-06-20 DIAGNOSIS — D2262 Melanocytic nevi of left upper limb, including shoulder: Secondary | ICD-10-CM | POA: Diagnosis not present

## 2022-06-20 DIAGNOSIS — X32XXXA Exposure to sunlight, initial encounter: Secondary | ICD-10-CM | POA: Diagnosis not present

## 2022-06-20 DIAGNOSIS — D2272 Melanocytic nevi of left lower limb, including hip: Secondary | ICD-10-CM | POA: Diagnosis not present

## 2022-06-20 DIAGNOSIS — D225 Melanocytic nevi of trunk: Secondary | ICD-10-CM | POA: Diagnosis not present

## 2022-06-20 DIAGNOSIS — D2261 Melanocytic nevi of right upper limb, including shoulder: Secondary | ICD-10-CM | POA: Diagnosis not present

## 2022-06-20 DIAGNOSIS — L570 Actinic keratosis: Secondary | ICD-10-CM | POA: Diagnosis not present

## 2022-06-20 DIAGNOSIS — L821 Other seborrheic keratosis: Secondary | ICD-10-CM | POA: Diagnosis not present

## 2022-06-21 DIAGNOSIS — M25511 Pain in right shoulder: Secondary | ICD-10-CM | POA: Diagnosis not present

## 2022-06-25 DIAGNOSIS — M25511 Pain in right shoulder: Secondary | ICD-10-CM | POA: Diagnosis not present

## 2022-06-30 ENCOUNTER — Other Ambulatory Visit: Payer: Self-pay | Admitting: Urology

## 2022-06-30 DIAGNOSIS — C61 Malignant neoplasm of prostate: Secondary | ICD-10-CM

## 2022-07-03 DIAGNOSIS — M25511 Pain in right shoulder: Secondary | ICD-10-CM | POA: Diagnosis not present

## 2022-07-06 ENCOUNTER — Encounter: Payer: Self-pay | Admitting: Internal Medicine

## 2022-07-09 ENCOUNTER — Encounter: Payer: Self-pay | Admitting: Internal Medicine

## 2022-07-09 ENCOUNTER — Ambulatory Visit (INDEPENDENT_AMBULATORY_CARE_PROVIDER_SITE_OTHER): Payer: Medicare HMO | Admitting: Internal Medicine

## 2022-07-09 DIAGNOSIS — K4090 Unilateral inguinal hernia, without obstruction or gangrene, not specified as recurrent: Secondary | ICD-10-CM | POA: Diagnosis not present

## 2022-07-09 DIAGNOSIS — M62830 Muscle spasm of back: Secondary | ICD-10-CM | POA: Diagnosis not present

## 2022-07-09 DIAGNOSIS — M9903 Segmental and somatic dysfunction of lumbar region: Secondary | ICD-10-CM | POA: Diagnosis not present

## 2022-07-09 DIAGNOSIS — M5416 Radiculopathy, lumbar region: Secondary | ICD-10-CM | POA: Diagnosis not present

## 2022-07-09 DIAGNOSIS — Z8719 Personal history of other diseases of the digestive system: Secondary | ICD-10-CM | POA: Insufficient documentation

## 2022-07-09 NOTE — Progress Notes (Signed)
Subjective:  [name], male    DOB: 1950-12-14  Age: 71 y.o. MRN: [id number]  CC: The encounter diagnosis was Inguinal hernia of right side without obstruction or gangrene.   HPI Edward Tran presents for  EVALUATION of right groin discomfort Chief Complaint  Patient presents with   Acute Visit    Swelling in groin     Edward Tran is a 71 yr old white male with  cT1c adenocarcinoma prostate (diagnosed by -Biopsy June 2019; PSA 4.0; volume 52 g, -Left mid core with focal Gleason 3+3 at 5%) managed by Edward Tran with active surveillance, PSA < 4.0 per last check who presents with 4 to 6 week history of discomfort and swelling in the right groin which is relieved with supine position and aggravated by exercise and standing.    Outpatient Medications Prior to Visit  Medication Sig Dispense Refill   Ascorbic Acid (VITAMIN C) 1000 MG tablet Take 1,000 mg by mouth daily.      atorvastatin (LIPITOR) 20 MG tablet Take 1 tablet (20 mg total) by mouth daily. 90 tablet 3   cholecalciferol (VITAMIN D) 25 MCG (1000 UT) tablet Take 1,000 Units by mouth daily.      loratadine (CLARITIN) 10 MG tablet TAKE 1 TABLET BY MOUTH EVERY DAY FOR ALLERGIES (OTC NOT COVERED) 90 tablet 3   Omega-3 Fatty Acids (FISH OIL) 1200 MG CAPS Take 2,400 mg by mouth daily.      sildenafil (REVATIO) 20 MG tablet Take 1 tablet (20 mg total) by mouth 3 (three) times daily. 30 tablet 0   tamsulosin (FLOMAX) 0.4 MG CAPS capsule TAKE 2 CAPSULES BY MOUTH EVERY DAY  AFTER  BREAKFAST 180 capsule 1   MODERNA COVID-19 VACCINE 100 MCG/0.5ML injection      No facility-administered medications prior to visit.    Review of Systems;  Patient denies headache, fevers, malaise, unintentional weight loss, skin rash, eye pain, sinus congestion and sinus pain, sore throat, dysphagia,  hemoptysis , cough, dyspnea, wheezing, chest pain, palpitations, orthopnea, edema, abdominal pain, nausea, melena, diarrhea, constipation, flank  pain, dysuria, hematuria, urinary  Frequency, nocturia, numbness, tingling, seizures,  Focal weakness, Loss of consciousness,  Tremor, insomnia, depression, anxiety, and suicidal ideation.      Objective:  BP 136/78 (BP Location: Left Arm, Patient Position: Sitting, Cuff Size: Normal)   Pulse 86   Temp 99 F (37.2 C) (Oral)   Ht '5\' 9"'$  (1.753 m)   Wt 171 lb (77.6 kg)   SpO2 97%   BMI 25.25 kg/m   BP Readings from Last 3 Encounters:  07/09/22 136/78  05/24/22 (!) 147/80  05/01/22 122/71    Wt Readings from Last 3 Encounters:  07/09/22 171 lb (77.6 kg)  05/24/22 170 lb (77.1 kg)  05/01/22 175 lb 0.7 oz (79.4 kg)    General appearance: ANXIOUS, alert, cooperative and appears stated age Ears: normal TM's and external ear canals both ears Throat: lips, mucosa, and tongue normal; teeth and gums normal Neck: no adenopathy, no carotid bruit, supple, symmetrical, trachea midline and thyroid not enlarged, symmetric, no tenderness/mass/nodules Back: symmetric, no curvature. ROM normal. No CVA tenderness. Lungs: clear to auscultation bilaterally Heart: regular rate and rhythm, S1, S2 normal, no murmur, click, rub or gallop Abdomen: soft, non-tender; bowel sounds normal; no masses,  no organomegaly Pulses: 2+ and symmetric Skin: Skin color, texture, turgor normal. No rashes or lesions Lymph nodes: Cervical, supraclavicular, and axillary nodes normal. GU: right groin with mild  swelling c/w inguinal hernia   Lab Results  Component Value Date   HGBA1C 5.5 02/05/2020    Lab Results  Component Value Date   CREATININE 0.83 04/09/2022   CREATININE 0.87 01/24/2022   CREATININE 0.81 10/12/2021    Lab Results  Component Value Date   WBC 8.3 01/24/2022   HGB 15.0 01/24/2022   HCT 44.6 01/24/2022   PLT 162 01/24/2022   GLUCOSE 94 04/09/2022   CHOL 142 04/09/2022   TRIG 70.0 04/09/2022   HDL 53.10 04/09/2022   LDLDIRECT 79.0 12/30/2015   LDLCALC 75 04/09/2022   ALT 20 04/09/2022    AST 17 04/09/2022   NA 139 04/09/2022   K 4.1 04/09/2022   CL 107 04/09/2022   CREATININE 0.83 04/09/2022   BUN 17 04/09/2022   CO2 25 04/09/2022   TSH 2.83 04/09/2022   PSA 3.10 03/11/2020   HGBA1C 5.5 02/05/2020   MICROALBUR <0.7 10/12/2021    Assessment & Plan:   Problem List Items Addressed This Visit     Inguinal hernia of right side without obstruction or gangrene    Reducible.  Aggravated by walking, light exercise and use of sildenafil.  Referring for surgery .  Behavior modifications outlined       Relevant Orders   Ambulatory referral to General Surgery    Follow-up: No follow-ups on file.   Edward Mc, MD

## 2022-07-09 NOTE — Telephone Encounter (Signed)
Pt returned call and scheduled an appt for this afternoon with Dr. Derrel Nip.

## 2022-07-09 NOTE — Assessment & Plan Note (Signed)
Reducible.  Aggravated by walking, light exercise and use of sildenafil.  Referring for surgery .  Behavior modifications outlined

## 2022-07-09 NOTE — Patient Instructions (Signed)
You definitely have an inguinal hernia   I will talk with Dr Bary Castilla about surgery  Pleas curb your activities to prevent this from becoming worse

## 2022-07-09 NOTE — Telephone Encounter (Signed)
LMTCB

## 2022-07-19 ENCOUNTER — Other Ambulatory Visit: Payer: Self-pay | Admitting: General Surgery

## 2022-07-19 DIAGNOSIS — K4090 Unilateral inguinal hernia, without obstruction or gangrene, not specified as recurrent: Secondary | ICD-10-CM | POA: Diagnosis not present

## 2022-07-19 NOTE — Progress Notes (Signed)
Progress Notes - documented in this encounter Edward Tran, Geronimo Boot, MD - 07/19/2022 10:00 AM EDT Formatting of this note is different from the original. Subjective:   [name] is a 71 y.o. male.  HPI  The following portions of the patient's history were reviewed and updated as appropriate.  This an established patient is here today for: office visit. Here for evaluation of right inguinal knot referred by Dr Derrel Nip. He states the knot does come and go.  He admits to some discomfort, ice helps. He noticed this about 6-8 weeks ago.  He is here with his wife, Edward Tran. They both retired last year. They have been walking 4-5 miles a day since last year. He has last about 10 pounds.  Review of Systems  Constitutional: Negative for chills and fever.  Respiratory: Negative for cough.   Chief Complaint  Patient presents with  Hernia    BP 132/78  Pulse 69  Temp 37.3 C (99.1 F)  Ht 175.3 cm ('5\' 9"'$ )  Wt 77.6 kg (171 lb)  SpO2 99%  BMI 25.25 kg/m   Past Medical History:  Diagnosis Date  Hyperlipidemia  Prostate cancer (CMS-HCC)    Past Surgical History:  Procedure Laterality Date  COLONOSCOPY 5188  UMBILICAL HERNIA REPAIR 41/66/0630  COLONOSCOPY 03/28/2020  shoulder arthroscopy with rotator cuff repari and subacromial decompression Right 05/01/2022  TONSILLECTOMY AND ADENOIDECTOMY    Social History   Socioeconomic History  Marital status: Married  Tobacco Use  Smoking status: Never  Smokeless tobacco: Never  Vaping Use  Vaping Use: Never used  Substance and Sexual Activity  Alcohol use: Yes  Comment: social  Drug use: Never    No Known Allergies  Current Outpatient Medications  Medication Sig Dispense Refill  atorvastatin (LIPITOR) 20 MG tablet Take 20 mg by mouth once daily  cholecalciferol, vitamin D3, (CHOLECALCIFEROL, VIT D3,,BULK,) 100,000 unit/gram Powd Take 1 tablet by mouth once daily  loratadine (CLARITIN) 10 mg tablet Take 10 mg  by mouth once daily  omega-3 fatty acids-fish oil 300-1,000 mg capsule Take 1,200 mg by mouth once daily  sildenafil, antihypertensive, (REVATIO) 20 mg tablet Take 20 mg by mouth as needed  simvastatin (ZOCOR) 20 MG tablet Take 20 mg by mouth nightly 1  tamsulosin (FLOMAX) 0.4 mg capsule Take 0.4 mg by mouth once daily   No current facility-administered medications for this visit.   Family History  Problem Relation Age of Onset  Stroke Mother 38  COPD Mother  Early death Father  COPD Sister  Cervical cancer Sister  mets to lung  Cancer Maternal Grandmother  Stroke Maternal Grandmother 92  Breast cancer Neg Hx  Colon cancer Neg Hx   Labs and Radiology:   Laboratory review to June 26 first, 2023:  Prostate Specific Ag, Serum 0.0 - 4.0 ng/mL 3.7   Sodium 135 - 145 mEq/L 139  Potassium 3.5 - 5.1 mEq/L 4.1  Chloride 96 - 112 mEq/L 107  CO2 19 - 32 mEq/L 25  Glucose, Bld 70 - 99 mg/dL 94  BUN 6 - 23 mg/dL 17  Creatinine, Ser 0.40 - 1.50 mg/dL 0.83  Total Bilirubin 0.2 - 1.2 mg/dL 0.6  Alkaline Phosphatase 39 - 117 U/L 62  AST 0 - 37 U/L 17  ALT 0 - 53 U/L 20  Total Protein 6.0 - 8.3 g/dL 6.6  Albumin 3.5 - 5.2 g/dL 4.3  GFR >60.00 mL/min 89.72  Comment: Calculated using the CKD-EPI Creatinine Equation (2021) Calcium 8.4 - 10.5 mg/dL  8.9   January 24, 2022 laboratory:  WBC 4.0 - 10.5 K/uL 8.3  RBC 4.22 - 5.81 MIL/uL 4.84  Hemoglobin 13.0 - 17.0 g/dL 15.0  HCT 39.0 - 52.0 % 44.6  MCV 80.0 - 100.0 fL 92.1  MCH 26.0 - 34.0 pg 31.0  MCHC 30.0 - 36.0 g/dL 33.6  RDW 11.5 - 15.5 % 13.0  Platelets 150 - 400 K/uL 162  nRBC 0.0 - 0.2 % 0.0   Chest x-ray Apr 13, 2022:  No active cardiopulmonary disease.  Colonoscopy March 08, 2020:  Fecal matter. No pathologic diagnosis.   Objective:  Physical Exam Constitutional:  Appearance: Normal appearance.  Cardiovascular:  Rate and Rhythm: Normal rate and regular rhythm.  Pulses: Normal pulses.  Heart sounds: Normal heart  sounds.  Pulmonary:  Effort: Pulmonary effort is normal.  Breath sounds: Normal breath sounds.  Abdominal:  Palpations: Abdomen is soft.  Tenderness: There is no abdominal tenderness.  Hernia: A hernia is present. Hernia is present in the right inguinal area (reducible). There is no hernia in the umbilical area or left inguinal area.  Comments: Umbilical hernia site from 2019 well-healed.  Musculoskeletal:  Cervical back: Neck supple.  Skin: General: Skin is warm and dry.  Neurological:  Mental Status: He is alert and oriented to person, place, and time.  Psychiatric:  Mood and Affect: Mood normal.  Behavior: Behavior normal.    Assessment:   Right inguinal hernia.  Plan:   Indications for elective repair with prosthetic mesh were reviewed. Activity restrictions in the immediate postoperative period discussed. Importance of ice and adequate scrotal support discussed.  His ED visit in February 2023 appears to be an isolated episode overindulgence in short-chain hydrocarbons.   This note is partially prepared by Karie Fetch, RN, acting as a scribe in the presence of Dr. Hervey Ard, MD.  The documentation recorded by the scribe accurately reflects the service I personally performed and the decisions made by me.   Robert Bellow, MD FACS  Electronically signed by Mayer Masker, MD at 07/19/2022 1:02 PM EDT

## 2022-07-23 ENCOUNTER — Encounter
Admission: RE | Admit: 2022-07-23 | Discharge: 2022-07-23 | Disposition: A | Discharge status: Home / Self Care | Payer: Medicare HMO | Source: Ambulatory Visit | Attending: General Surgery | Admitting: General Surgery

## 2022-07-23 NOTE — Patient Instructions (Addendum)
Your procedure is scheduled on: Wednesday July 25, 2022. Report to Day Surgery inside [facility] 2nd floor, stop by admissions desk before getting on elevator. To find out your arrival time please call [phone] between 1PM - 3PM on Tuesday July 24, 2022.  Remember: Instructions that are not followed completely may result in serious medical risk,  up to and including death, or upon the discretion of your surgeon and anesthesiologist your  surgery may need to be rescheduled.     _X__ 1. Do not eat food after midnight the night before your procedure.                 No chewing gum or hard candies. You may drink clear liquids up to 2 hours                 before you are scheduled to arrive for your surgery- DO not drink clear                 liquids within 2 hours of the start of your surgery.                 Clear Liquids include:  water, apple juice without pulp, clear Gatorade, G2 or                  Gatorade Zero (avoid Red/Purple/Blue), Black Coffee or Tea (Do not add                 anything to coffee or tea).  __X__2.  On the morning of surgery brush your teeth with toothpaste and water, you                may rinse your mouth with mouthwash if you wish.  Do not swallow any toothpaste or mouthwash.     _X__ 3.  No Alcohol for 24 hours before or after surgery.   _X__ 4.  Do Not Smoke or use e-cigarettes For 24 Hours Prior to Your Surgery.                 Do not use any chewable tobacco products for at least 6 hours prior to                 Surgery.  _X__  5.  Do not use any recreational drugs (marijuana, cocaine, heroin, ecstasy, MDMA or other)                For at least one week prior to your surgery.  Combination of these drugs with anesthesia                May have life threatening results.  ____  6.  Bring all medications with you on the day of surgery if instructed.   __X__  7.  Notify your doctor if there is any change in  your medical condition      (cold, fever, infections).     Do not wear jewelry, make-up, hairpins, clips or nail polish. Do not wear lotions, powders, or perfumes. You may wear deodorant. Do not shave 48 hours prior to surgery. Men may shave face and neck. Do not bring valuables to the hospital.    [facility] is not responsible for any belongings or valuables.  Contacts, dentures or bridgework may not be worn into surgery. Leave your suitcase in the car. After surgery it may be brought to your room. For patients admitted to the hospital, discharge time is determined  by your treatment team.   Patients discharged the day of surgery will not be allowed to drive home.   Make arrangements for someone to be with you for the first 24 hours of your Same Day Discharge.   __X__ Take these medicines the morning of surgery with A SIP OF WATER:    1. tamsulosin (FLOMAX) 0.4 MG CAPS   2. loratadine (CLARITIN) 10 MG   3.   4.  5.  6.  ____ Fleet Enema (as directed)   __X__ Use CHG Soap (or wipes) as directed  ____ Use Benzoyl Peroxide Gel as instructed  ____ Use inhalers on the day of surgery  ____ Stop metformin 2 days prior to surgery    ____ Take 1/2 of usual insulin dose the night before surgery. No insulin the morning          of surgery.   ____ Call your PCP, cardiologist, or Pulmonologist if taking Coumadin/Plavix/aspirin and ask when to stop before your surgery.   __X__ One Week prior to surgery- Stop Anti-inflammatories such as Ibuprofen, Aleve, Advil, Motrin, meloxicam (MOBIC), diclofenac, etodolac, ketorolac, Toradol, Daypro, piroxicam, Goody's or BC powders. OK TO USE TYLENOL IF NEEDED   __X__ Stop supplements until after surgery.   ____ Bring C-Pap to the hospital.    If you have any questions regarding your pre-procedure instructions,  Please call Pre-admit Testing at [phone]

## 2022-07-25 ENCOUNTER — Encounter: Payer: Self-pay | Admitting: General Surgery

## 2022-07-25 ENCOUNTER — Encounter
Admission: RE | Disposition: A | Discharge status: Home / Self Care | Payer: Self-pay | Source: Home / Self Care | Attending: General Surgery

## 2022-07-25 ENCOUNTER — Other Ambulatory Visit: Payer: Self-pay

## 2022-07-25 ENCOUNTER — Ambulatory Visit: Payer: Medicare HMO | Admitting: Certified Registered Nurse Anesthetist

## 2022-07-25 ENCOUNTER — Ambulatory Visit
Admission: RE | Admit: 2022-07-25 | Discharge: 2022-07-25 | Disposition: A | Discharge status: Home / Self Care | Payer: Medicare HMO | Attending: General Surgery | Admitting: General Surgery

## 2022-07-25 DIAGNOSIS — K4090 Unilateral inguinal hernia, without obstruction or gangrene, not specified as recurrent: Secondary | ICD-10-CM | POA: Diagnosis not present

## 2022-07-25 DIAGNOSIS — Z01818 Encounter for other preprocedural examination: Secondary | ICD-10-CM | POA: Diagnosis not present

## 2022-07-25 DIAGNOSIS — D176 Benign lipomatous neoplasm of spermatic cord: Secondary | ICD-10-CM | POA: Diagnosis not present

## 2022-07-25 HISTORY — PX: INGUINAL HERNIA REPAIR: SHX194

## 2022-07-25 HISTORY — PX: INSERTION OF MESH: SHX5868

## 2022-07-25 SURGERY — REPAIR, HERNIA, INGUINAL, ADULT
Anesthesia: General | Site: Inguinal | Laterality: Right

## 2022-07-25 MED ORDER — OXYCODONE HCL 5 MG/5ML PO SOLN: 5.0000 mg | Freq: Once | ORAL | Status: DC | PRN

## 2022-07-25 MED ORDER — LACTATED RINGERS IV SOLN: INTRAVENOUS | Status: DC

## 2022-07-25 MED ORDER — EPHEDRINE SULFATE (PRESSORS) 50 MG/ML IJ SOLN
INTRAMUSCULAR | Status: DC | PRN
  Administered 2022-07-25: 5 mg via INTRAVENOUS
  Administered 2022-07-25 (×3): 2.5 mg via INTRAVENOUS

## 2022-07-25 MED ORDER — EPHEDRINE 5 MG/ML INJ
INTRAVENOUS | Status: AC
  Filled 2022-07-25: qty 5

## 2022-07-25 MED ORDER — LIDOCAINE HCL (CARDIAC) PF 100 MG/5ML IV SOSY
PREFILLED_SYRINGE | INTRAVENOUS | Status: DC | PRN
  Administered 2022-07-25: 80 mg via INTRAVENOUS

## 2022-07-25 MED ORDER — FENTANYL CITRATE (PF) 100 MCG/2ML IJ SOLN: 25.0000 ug | INTRAMUSCULAR | Status: DC | PRN

## 2022-07-25 MED ORDER — KETOROLAC TROMETHAMINE 30 MG/ML IJ SOLN
INTRAMUSCULAR | Status: DC | PRN
  Administered 2022-07-25: 30 mg

## 2022-07-25 MED ORDER — SUCCINYLCHOLINE CHLORIDE 200 MG/10ML IV SOSY
PREFILLED_SYRINGE | INTRAVENOUS | Status: AC
  Filled 2022-07-25: qty 10

## 2022-07-25 MED ORDER — CHLORHEXIDINE GLUCONATE CLOTH 2 % EX PADS: 6.0000 | MEDICATED_PAD | Freq: Once | CUTANEOUS | Status: DC

## 2022-07-25 MED ORDER — GLYCOPYRROLATE 0.2 MG/ML IJ SOLN
INTRAMUSCULAR | Status: AC
  Filled 2022-07-25: qty 1

## 2022-07-25 MED ORDER — ONDANSETRON HCL 4 MG/2ML IJ SOLN
INTRAMUSCULAR | Status: AC
  Filled 2022-07-25: qty 8

## 2022-07-25 MED ORDER — ESMOLOL HCL 100 MG/10ML IV SOLN
INTRAVENOUS | Status: AC
  Filled 2022-07-25: qty 10

## 2022-07-25 MED ORDER — ACETAMINOPHEN 10 MG/ML IV SOLN: 1000.0000 mg | Freq: Once | INTRAVENOUS | Status: DC | PRN

## 2022-07-25 MED ORDER — FAMOTIDINE 20 MG PO TABS: 20.0000 mg | ORAL_TABLET | Freq: Once | ORAL | Status: AC

## 2022-07-25 MED ORDER — BUPIVACAINE-EPINEPHRINE (PF) 0.5% -1:200000 IJ SOLN
INTRAMUSCULAR | Status: AC
  Filled 2022-07-25: qty 30

## 2022-07-25 MED ORDER — DEXAMETHASONE SODIUM PHOSPHATE 10 MG/ML IJ SOLN
INTRAMUSCULAR | Status: DC | PRN
  Administered 2022-07-25: 10 mg via INTRAVENOUS

## 2022-07-25 MED ORDER — FENTANYL CITRATE (PF) 100 MCG/2ML IJ SOLN
INTRAMUSCULAR | Status: DC | PRN
  Administered 2022-07-25: 50 ug via INTRAVENOUS
  Administered 2022-07-25 (×2): 25 ug via INTRAVENOUS

## 2022-07-25 MED ORDER — OXYCODONE HCL 5 MG PO TABS: 5.0000 mg | ORAL_TABLET | Freq: Once | ORAL | Status: DC | PRN

## 2022-07-25 MED ORDER — ORAL CARE MOUTH RINSE: 15.0000 mL | Freq: Once | OROMUCOSAL | Status: AC

## 2022-07-25 MED ORDER — ACETAMINOPHEN 10 MG/ML IV SOLN
INTRAVENOUS | Status: AC
  Filled 2022-07-25: qty 100

## 2022-07-25 MED ORDER — PROPOFOL 10 MG/ML IV BOLUS
INTRAVENOUS | Status: DC | PRN
  Administered 2022-07-25: 120 mg via INTRAVENOUS

## 2022-07-25 MED ORDER — CHLORHEXIDINE GLUCONATE 0.12 % MT SOLN: 15.0000 mL | Freq: Once | OROMUCOSAL | Status: AC

## 2022-07-25 MED ORDER — CEFAZOLIN SODIUM-DEXTROSE 2-4 GM/100ML-% IV SOLN
INTRAVENOUS | Status: AC
  Filled 2022-07-25: qty 100

## 2022-07-25 MED ORDER — ESMOLOL HCL 100 MG/10ML IV SOLN
INTRAVENOUS | Status: DC | PRN
  Administered 2022-07-25: 5 mg via INTRAVENOUS

## 2022-07-25 MED ORDER — BUPIVACAINE-EPINEPHRINE (PF) 0.5% -1:200000 IJ SOLN
INTRAMUSCULAR | Status: DC | PRN
  Administered 2022-07-25: 20 mL
  Administered 2022-07-25: 10 mL

## 2022-07-25 MED ORDER — GLYCOPYRROLATE 0.2 MG/ML IJ SOLN
INTRAMUSCULAR | Status: DC | PRN
  Administered 2022-07-25: .2 mg via INTRAVENOUS

## 2022-07-25 MED ORDER — FENTANYL CITRATE (PF) 100 MCG/2ML IJ SOLN
INTRAMUSCULAR | Status: AC
  Filled 2022-07-25: qty 2

## 2022-07-25 MED ORDER — ONDANSETRON HCL 4 MG/2ML IJ SOLN
INTRAMUSCULAR | Status: DC | PRN
  Administered 2022-07-25: 4 mg via INTRAVENOUS

## 2022-07-25 MED ORDER — CEFAZOLIN SODIUM-DEXTROSE 2-4 GM/100ML-% IV SOLN
2.0000 g | INTRAVENOUS | Status: AC
  Administered 2022-07-25: 2 g via INTRAVENOUS

## 2022-07-25 MED ORDER — CHLORHEXIDINE GLUCONATE 0.12 % MT SOLN
OROMUCOSAL | Status: AC
  Administered 2022-07-25: 15 mL via OROMUCOSAL
  Filled 2022-07-25: qty 15

## 2022-07-25 MED ORDER — PROPOFOL 10 MG/ML IV BOLUS
INTRAVENOUS | Status: AC
  Filled 2022-07-25: qty 20

## 2022-07-25 MED ORDER — DEXAMETHASONE SODIUM PHOSPHATE 10 MG/ML IJ SOLN
INTRAMUSCULAR | Status: AC
  Filled 2022-07-25: qty 2

## 2022-07-25 MED ORDER — 0.9 % SODIUM CHLORIDE (POUR BTL) OPTIME
TOPICAL | Status: DC | PRN
  Administered 2022-07-25: 500 mL

## 2022-07-25 MED ORDER — KETOROLAC TROMETHAMINE 30 MG/ML IJ SOLN
INTRAMUSCULAR | Status: AC
  Filled 2022-07-25: qty 1

## 2022-07-25 MED ORDER — MIDAZOLAM HCL 2 MG/2ML IJ SOLN
INTRAMUSCULAR | Status: AC
  Filled 2022-07-25: qty 2

## 2022-07-25 MED ORDER — ACETAMINOPHEN 10 MG/ML IV SOLN
INTRAVENOUS | Status: DC | PRN
  Administered 2022-07-25: 1000 mg via INTRAVENOUS

## 2022-07-25 MED ORDER — LIDOCAINE HCL (PF) 2 % IJ SOLN
INTRAMUSCULAR | Status: AC
  Filled 2022-07-25: qty 15

## 2022-07-25 MED ORDER — MIDAZOLAM HCL 2 MG/2ML IJ SOLN
INTRAMUSCULAR | Status: DC | PRN
  Administered 2022-07-25: 2 mg via INTRAVENOUS

## 2022-07-25 MED ORDER — HYDROCODONE-ACETAMINOPHEN 5-325 MG PO TABS: 1.0000 | ORAL_TABLET | ORAL | 0 refills | Status: DC | PRN

## 2022-07-25 MED ORDER — ONDANSETRON HCL 4 MG/2ML IJ SOLN: 4.0000 mg | Freq: Once | INTRAMUSCULAR | Status: DC | PRN

## 2022-07-25 MED ORDER — FAMOTIDINE 20 MG PO TABS
ORAL_TABLET | ORAL | Status: AC
  Administered 2022-07-25: 20 mg via ORAL
  Filled 2022-07-25: qty 1

## 2022-07-25 SURGICAL SUPPLY — 36 items
BLADE SURG 15 STRL SS SAFETY (BLADE) ×2 IMPLANT
CHLORAPREP W/TINT 26 (MISCELLANEOUS) ×1 IMPLANT
DRAIN PENROSE 12X.25 LTX STRL (MISCELLANEOUS) ×1 IMPLANT
DRAPE LAPAROTOMY 100X77 ABD (DRAPES) ×1 IMPLANT
DRSG TEGADERM 4X4.75 (GAUZE/BANDAGES/DRESSINGS) ×1 IMPLANT
DRSG TELFA 3X4 N-ADH STERILE (GAUZE/BANDAGES/DRESSINGS) ×1 IMPLANT
ELECT REM PT RETURN 9FT ADLT (ELECTROSURGICAL) ×1
ELECTRODE REM PT RTRN 9FT ADLT (ELECTROSURGICAL) ×1 IMPLANT
GAUZE 4X4 16PLY ~~LOC~~+RFID DBL (SPONGE) ×1 IMPLANT
GLOVE BIO SURGEON STRL SZ7.5 (GLOVE) ×1 IMPLANT
GLOVE SURG UNDER LTX SZ8 (GLOVE) ×1 IMPLANT
GOWN STRL REUS W/ TWL LRG LVL3 (GOWN DISPOSABLE) ×2 IMPLANT
GOWN STRL REUS W/TWL LRG LVL3 (GOWN DISPOSABLE) ×2
KIT TURNOVER KIT A (KITS) ×1 IMPLANT
MANIFOLD NEPTUNE II (INSTRUMENTS) ×1 IMPLANT
MESH HERNIA 6X12 ULTRAPRO MED (Mesh General) IMPLANT
MESH HERNIA PLUG LRG (Mesh General) IMPLANT
MESH HERNIA ULTRAPRO MED (Mesh General) ×1 IMPLANT
MESH MARLEX PLUG MEDIUM (Mesh General) IMPLANT
NEEDLE HYPO 22GX1.5 SAFETY (NEEDLE) ×2 IMPLANT
PACK BASIN MINOR ARMC (MISCELLANEOUS) ×1 IMPLANT
STRIP CLOSURE SKIN 1/2X4 (GAUZE/BANDAGES/DRESSINGS) ×1 IMPLANT
SUT PDS AB 0 CT1 27 (SUTURE) IMPLANT
SUT SURGILON 0 BLK (SUTURE) ×1 IMPLANT
SUT VIC AB 2-0 SH 27 (SUTURE) ×1
SUT VIC AB 2-0 SH 27XBRD (SUTURE) ×1 IMPLANT
SUT VIC AB 3-0 54X BRD REEL (SUTURE) ×1 IMPLANT
SUT VIC AB 3-0 BRD 54 (SUTURE) ×1
SUT VIC AB 3-0 SH 27 (SUTURE) ×1
SUT VIC AB 3-0 SH 27X BRD (SUTURE) ×1 IMPLANT
SUT VIC AB 4-0 FS2 27 (SUTURE) ×1 IMPLANT
SWABSTK COMLB BENZOIN TINCTURE (MISCELLANEOUS) ×1 IMPLANT
SYR 10ML LL (SYRINGE) ×1 IMPLANT
SYR 3ML LL SCALE MARK (SYRINGE) ×1 IMPLANT
TRAP FLUID SMOKE EVACUATOR (MISCELLANEOUS) ×1 IMPLANT
WATER STERILE IRR 500ML POUR (IV SOLUTION) ×1 IMPLANT

## 2022-07-25 NOTE — Anesthesia Postprocedure Evaluation (Signed)
Anesthesia Post Note  Patient: Edward Tran  Procedure(s) Performed: HERNIA REPAIR INGUINAL ADULT (Right: Inguinal) INSERTION OF MESH (Right: Inguinal)  Patient location during evaluation: PACU Anesthesia Type: General Level of consciousness: awake and alert, oriented and patient cooperative Pain management: pain level controlled Vital Signs Assessment: post-procedure vital signs reviewed and stable Respiratory status: spontaneous breathing, nonlabored ventilation and respiratory function stable Cardiovascular status: blood pressure returned to baseline and stable Postop Assessment: adequate PO intake Anesthetic complications: no   No notable events documented.   Last Vitals:  Vitals:   07/25/22 1215 07/25/22 1230  BP:  (!) 136/90  Pulse: 92 90  Resp:  20  Temp:    SpO2:  97%    Last Pain:  Vitals:   07/25/22 1230  TempSrc:   PainSc: 0-No pain                 Darrin Nipper

## 2022-07-25 NOTE — Progress Notes (Signed)
EKG obtained per Dr. Erenest Rasher order, shows SR with PAC, PVC's. Per patient his pcp aware of latter. Denies chest pain, shortness of breath. Upon arrival to pacu rhythm irregular, afib vs  SR wit pac, pvc's.  EKG reviewed by Dr. Erenest Rasher, no new orders.  Of note: upon transfer to post-op noted rash upper left arm, Dr. Erenest Rasher aware, does not believe any allergic reaction. Instructed patient to monitor, if any issues contact pcp. Patient verbalizes understanding.

## 2022-07-25 NOTE — Transfer of Care (Signed)
Immediate Anesthesia Transfer of Care Note  Patient: Edward Tran  Procedure(s) Performed: HERNIA REPAIR INGUINAL ADULT (Right: Inguinal) INSERTION OF MESH (Right: Inguinal)  Patient Location: PACU  Anesthesia Type:General  Level of Consciousness: awake, alert  and oriented  Airway & Oxygen Therapy: Patient Spontanous Breathing and Patient connected to face mask oxygen  Post-op Assessment: Report given to RN and Post -op Vital signs reviewed and stable  Post vital signs: Reviewed and stable  Last Vitals:  Vitals Value Taken Time  BP 111/68 07/25/22 1210  Temp    Pulse 54 07/25/22 1212  Resp 16 07/25/22 1212  SpO2 99 % 07/25/22 1212  Vitals shown include unvalidated device data.  Last Pain:  Vitals:   07/25/22 0939  TempSrc: Oral  PainSc: 0-No pain         Complications: No notable events documented.

## 2022-07-25 NOTE — Anesthesia Preprocedure Evaluation (Addendum)
Anesthesia Evaluation  Patient identified by MRN, date of birth, ID band Patient awake    Reviewed: Allergy & Precautions, NPO status , Patient's Chart, lab work & pertinent test results  History of Anesthesia Complications Negative for: history of anesthetic complications  Airway Mallampati: IV   Neck ROM: Full    Dental no notable dental hx.    Pulmonary neg pulmonary ROS,    Pulmonary exam normal breath sounds clear to auscultation       Cardiovascular Exercise Tolerance: Good negative cardio ROS Normal cardiovascular exam Rhythm:Regular Rate:Normal  ECG 04/13/22: Sinus  Rhythm  - occasional ectopic ventricular beat   Neuro/Psych negative neurological ROS     GI/Hepatic negative GI ROS,   Endo/Other  negative endocrine ROS  Renal/GU negative Renal ROS   Prostate CA    Musculoskeletal   Abdominal   Peds  Hematology negative hematology ROS (+)   Anesthesia Other Findings   Reproductive/Obstetrics                            Anesthesia Physical Anesthesia Plan  ASA: 2  Anesthesia Plan: General   Post-op Pain Management:    Induction: Intravenous  PONV Risk Score and Plan: 2 and Ondansetron, Dexamethasone and Treatment may vary due to age or medical condition  Airway Management Planned: LMA  Additional Equipment:   Intra-op Plan:   Post-operative Plan: Extubation in OR  Informed Consent: I have reviewed the patients History and Physical, chart, labs and discussed the procedure including the risks, benefits and alternatives for the proposed anesthesia with the patient or authorized representative who has indicated his/her understanding and acceptance.     Dental advisory given  Plan Discussed with: CRNA  Anesthesia Plan Comments: (Patient consented for risks of anesthesia including but not limited to:  - adverse reactions to medications - damage to eyes, teeth, lips or  other oral mucosa - nerve damage due to positioning  - sore throat or hoarseness - damage to heart, brain, nerves, lungs, other parts of body or loss of life  Informed patient about role of CRNA in peri- and intra-operative care.  Patient voiced understanding.)       Anesthesia Quick Evaluation

## 2022-07-25 NOTE — Discharge Instructions (Signed)
AMBULATORY SURGERY  DISCHARGE INSTRUCTIONS   The drugs that you were given will stay in your system until tomorrow so for the next 24 hours you should not:  Drive an automobile Make any legal decisions Drink any alcoholic beverage   You may resume regular meals tomorrow.  Today it is better to start with liquids and gradually work up to solid foods.  You may eat anything you prefer, but it is better to start with liquids, then soup and crackers, and gradually work up to solid foods.   Please notify your doctor immediately if you have any unusual bleeding, trouble breathing, redness and pain at the surgery site, drainage, fever, or pain not relieved by medication.       Please contact your physician with any problems or Same Day Surgery at [phone], Monday through Friday 6 am to 4 pm, or [facility] Main number at [phone].  

## 2022-07-25 NOTE — Anesthesia Procedure Notes (Signed)
Procedure Name: LMA Insertion Date/Time: 07/25/2022 10:59 AM  Performed by: Lily Peer, Lianah Peed, CRNAPre-anesthesia Checklist: Patient identified, Emergency Drugs available, Suction available and Patient being monitored Patient Re-evaluated:Patient Re-evaluated prior to induction Oxygen Delivery Method: Circle system utilized Preoxygenation: Pre-oxygenation with 100% oxygen Induction Type: IV induction Ventilation: Mask ventilation without difficulty LMA: LMA inserted LMA Size: 4.0 Number of attempts: 1 Placement Confirmation: positive ETCO2 and breath sounds checked- equal and bilateral Tube secured with: Tape Dental Injury: Teeth and Oropharynx as per pre-operative assessment

## 2022-07-25 NOTE — H&P (Signed)
Edward Tran [id number] March 05, 1951     HPI:  Healthy 71 y/o male with recently noted right inguinal hernia.  For mesh repair.   Medications Prior to Admission  Medication Sig Dispense Refill Last Dose   atorvastatin (LIPITOR) 20 MG tablet Take 1 tablet (20 mg total) by mouth daily. 90 tablet 3 07/24/2022   loratadine (CLARITIN) 10 MG tablet TAKE 1 TABLET BY MOUTH EVERY DAY FOR ALLERGIES (OTC NOT COVERED) 90 tablet 3 07/25/2022 at 0715   tamsulosin (FLOMAX) 0.4 MG CAPS capsule TAKE 2 CAPSULES BY MOUTH EVERY DAY  AFTER  BREAKFAST 180 capsule 1 07/25/2022 at 0715   Ascorbic Acid (VITAMIN C) 1000 MG tablet Take 1,000 mg by mouth daily.  (Patient not taking: Reported on 07/23/2022)   Not Taking   cholecalciferol (VITAMIN D) 25 MCG (1000 UT) tablet Take 1,000 Units by mouth daily.  (Patient not taking: Reported on 07/23/2022)   Not Taking   Omega-3 Fatty Acids (FISH OIL) 1200 MG CAPS Take 2,400 mg by mouth daily.  (Patient not taking: Reported on 07/23/2022)   Not Taking   sildenafil (REVATIO) 20 MG tablet Take 1 tablet (20 mg total) by mouth 3 (three) times daily. (Patient taking differently: Take 20 mg by mouth as needed.) 30 tablet 0 PRN   No Known Allergies Past Medical History:  Diagnosis Date   Hyperlipidemia    Medical history non-contributory    Prostate cancer ([place])    low score   Umbilical hernia without obstruction and without gangrene 01/20/2017   Past Surgical History:  Procedure Laterality Date   COLONOSCOPY  2016   COLONOSCOPY W/ POLYPECTOMY     COLONOSCOPY WITH PROPOFOL N/A 03/28/2020   Procedure: COLONOSCOPY WITH PROPOFOL;  Surgeon: Jonathon Bellows, MD;  Location: [facility] ENDOSCOPY;  Service: Gastroenterology;  Laterality: N/A;   SHOULDER ARTHROSCOPY WITH ROTATOR CUFF REPAIR AND SUBACROMIAL DECOMPRESSION Right 05/01/2022   Procedure: SHOULDER ARTHROSCOPY WITH ROTATOR CUFF REPAIR AND SUBACROMIAL DECOMPRESSION, DISTAL CLAVICLE ACROMINECTOMY;  Surgeon: Thornton Park, MD;  Location: [facility]  ORS;  Service: Orthopedics;  Laterality: Right;   TONSILLECTOMY     as a child   TONSILLECTOMY AND ADENOIDECTOMY     UMBILICAL HERNIA REPAIR N/A 11/17/2018   Procedure: HERNIA REPAIR UMBILICAL ADULT;  Surgeon: Robert Bellow, MD;  Location: [facility] ORS;  Service: General;  Laterality: N/A;   Social History   Socioeconomic History   Marital status: Married    Spouse name: [name]   Number of children: Not on file   Years of education: Not on file   Highest education level: Not on file  Occupational History   Not on file  Tobacco Use   Smoking status: Never   Smokeless tobacco: Never  Vaping Use   Vaping Use: Never used  Substance and Sexual Activity   Alcohol use: Yes    Alcohol/week: 4.0 standard drinks of alcohol    Types: 4 Cans of beer per week    Comment: weekly   Drug use: No   Sexual activity: Yes  Other Topics Concern   Not on file  Social History Narrative   Not on file   Social Determinants of Health   Financial Resource Strain: Low Risk  (04/18/2022)   Overall Financial Resource Strain (CARDIA)    Difficulty of Paying Living Expenses: Not hard at all  Food Insecurity: No Food Insecurity (04/18/2022)   Hunger Vital Sign    Worried About Running Out of Food in the Last Year: Never true  Ran Out of Food in the Last Year: Never true  Transportation Needs: No Transportation Needs (04/18/2022)   PRAPARE - Hydrologist (Medical): No    Lack of Transportation (Non-Medical): No  Physical Activity: Sufficiently Active (04/18/2022)   Exercise Vital Sign    Days of Exercise per Week: 5 days    Minutes of Exercise per Session: 60 min  Stress: No Stress Concern Present (04/18/2022)   Dante    Feeling of Stress : Not at all  Social Connections: Unknown (04/18/2022)   Social Connection and Isolation Panel [NHANES]    Frequency of Communication with Friends and Family: Not on  file    Frequency of Social Gatherings with Friends and Family: Not on file    Attends Religious Services: Not on file    Active Member of Clubs or Organizations: Not on file    Attends Archivist Meetings: Not on file    Marital Status: Married  Intimate Partner Violence: Not At Risk (04/18/2022)   Humiliation, Afraid, Rape, and Kick questionnaire    Fear of Current or Ex-Partner: No    Emotionally Abused: No    Physically Abused: No    Sexually Abused: No   Social History   Social History Narrative   Not on file     ROS: Negative.     PE: HEENT: Negative. Lungs: Clear. Cardio: RR.   Assessment/Plan:  Proceed with planned open right inguinal hernia repair with mesh. Forest Gleason Chennel Olivos 07/25/2022

## 2022-07-25 NOTE — Op Note (Signed)
Preoperative diagnosis: Right inguinal hernia.  Postoperative diagnosis: Same.  Operative procedure: Right inguinal hernia repair with medium Ultra Pro mesh.  Operating surgeon: Hervey Ard, MD.  Anesthesia: General by LMA, Marcaine 0.5% with 1: 200,000 notes of epinephrine, 30 cc; Toradol: 30 mg.  Estimated blood loss: Less than 2 cc.  Clinical note: This 71 year old male is developed symptomatic right inguinal hernia.  He was admitted for elective repair.  Hair was removed and the surgical site with clippers prior to presentation to the operating theater.  SCD stockings for DVT prevention.  Ancef was admitted intravenously on induction of anesthesia.  Operative note: With the patient under adequate general anesthesia the surgical site was cleansed with ChloraPrep and draped.  Field block anesthesia was established and a 5 cm skin line incision was made along the anticipated course the inguinal canal.  Skin was incised sharply remaining dissection electrocautery.  A single crossing vein was controlled with 3-0 Vicryl tie.  The external oblique fascia was opened in the direction of its fibers.  The ilioinguinal iliohypogastric nerves were identified and protected.  A small lipoma the cord was excised and hemostasis with a 3-0 Vicryl tie.  The indirect hernia sac was dissected free of adjacent cord structures.  There was mild weakness of the medial aspect of the floor of the canal with posterior palpation.  A medium Ultra Pro mesh was placed in the preperitoneal space and then the external component along the floor of the inguinal canal.  This was anchored to the pubic tubercle with 0 Surgilon sutures.  Similar sutures were used along the inguinal ligament.  A lateral slit was made for cord passage and closed.  The medial and superior aspects of the mesh were anchored to the transverse abdominis aponeurosis.  Toradol was placed in the wound.  The nerves were returned to their bed.  The external  oblique was closed with a running 2-0 Vicryl suture.  Scarpa's fascia was closed with a running 3-0 Vicryl suture and the skin closed with a running 4-0 Vicryl subcuticular suture.  Benzoin, Steri-Strips, Telfa and Tegaderm dressing were applied.  Patient tolerated procedure well and was taken to the PACU in stable condition.

## 2022-08-02 ENCOUNTER — Other Ambulatory Visit: Payer: Self-pay | Admitting: Internal Medicine

## 2022-08-02 DIAGNOSIS — J302 Other seasonal allergic rhinitis: Secondary | ICD-10-CM

## 2022-08-07 ENCOUNTER — Encounter: Payer: Self-pay | Admitting: Internal Medicine

## 2022-08-07 ENCOUNTER — Ambulatory Visit (INDEPENDENT_AMBULATORY_CARE_PROVIDER_SITE_OTHER): Payer: Medicare HMO | Admitting: Internal Medicine

## 2022-08-07 VITALS — BP 110/64 | HR 79 | Temp 98.3°F | Ht 69.0 in | Wt 171.5 lb

## 2022-08-07 DIAGNOSIS — I6782 Cerebral ischemia: Secondary | ICD-10-CM

## 2022-08-07 DIAGNOSIS — E782 Mixed hyperlipidemia: Secondary | ICD-10-CM | POA: Diagnosis not present

## 2022-08-07 DIAGNOSIS — C61 Malignant neoplasm of prostate: Secondary | ICD-10-CM | POA: Diagnosis not present

## 2022-08-07 DIAGNOSIS — M5416 Radiculopathy, lumbar region: Secondary | ICD-10-CM | POA: Diagnosis not present

## 2022-08-07 DIAGNOSIS — M9903 Segmental and somatic dysfunction of lumbar region: Secondary | ICD-10-CM | POA: Diagnosis not present

## 2022-08-07 DIAGNOSIS — E538 Deficiency of other specified B group vitamins: Secondary | ICD-10-CM | POA: Diagnosis not present

## 2022-08-07 DIAGNOSIS — M62830 Muscle spasm of back: Secondary | ICD-10-CM | POA: Diagnosis not present

## 2022-08-07 NOTE — Assessment & Plan Note (Signed)
Continue atorvastatin.  Reviewed the role of diet, sleep and exercise in preventing dementia

## 2022-08-07 NOTE — Assessment & Plan Note (Signed)
Diagnosed in 2019, managed with watchful waiting by [facility]. With  Most recent biopsies benign.  Repeat PSA improved and is on semi annual repeat by Urology

## 2022-08-07 NOTE — Progress Notes (Signed)
Subjective:  [name], male    DOB: November 19, 1951  Age: 71 y.o. MRN: [id number]  CC: The primary encounter diagnosis was Mixed hyperlipidemia. Diagnoses of B12 deficiency, Ischemic changes on computed tomography of head, and Prostate cancer [facility]) were also pertinent to this visit.   HPI Edward Tran presents for  Chief Complaint  Patient presents with   Follow-up    6 month follow up    1) s/p right indirect hernia repair done August 23 by [facility].  Post op visit August 30.  His post operative pain has nearly resolved,  and he has begun walking for exercise.  He denies pain with walking .    2)  follow up on hyperlipidemia.  He is tolerating atorvastatin without myalgias .    3) Cerebral atrophy:  reviewed CT head done earlier in the year and the significance of chronic small vessel ischemia and cerebral atrophy.  He has occasional memory lapses, mostly of names. Discussed the role of diet, exercise and restorative sleep in preventing dementia   Outpatient Medications Prior to Visit  Medication Sig Dispense Refill   Ascorbic Acid (VITAMIN C) 1000 MG tablet Take 1,000 mg by mouth daily.     atorvastatin (LIPITOR) 20 MG tablet Take 1 tablet (20 mg total) by mouth daily. 90 tablet 3   cholecalciferol (VITAMIN D) 25 MCG (1000 UT) tablet Take 1,000 Units by mouth daily.     loratadine (CLARITIN) 10 MG tablet TAKE 1 TABLET EVERY DAY 90 tablet 3   sildenafil (REVATIO) 20 MG tablet Take 1 tablet (20 mg total) by mouth 3 (three) times daily. (Patient taking differently: Take 20 mg by mouth as needed.) 30 tablet 0   tamsulosin (FLOMAX) 0.4 MG CAPS capsule TAKE 2 CAPSULES BY MOUTH EVERY DAY  AFTER  BREAKFAST 180 capsule 1   HYDROcodone-acetaminophen (NORCO/VICODIN) 5-325 MG tablet Take 1 tablet by mouth every 4 (four) hours as needed for moderate pain. 20 tablet 0   Omega-3 Fatty Acids (FISH OIL) 1200 MG CAPS Take 2,400 mg by mouth daily.  (Patient not taking: Reported on  07/23/2022)     No facility-administered medications prior to visit.    Review of Systems;  Patient denies headache, fevers, malaise, unintentional weight loss, skin rash, eye pain, sinus congestion and sinus pain, sore throat, dysphagia,  hemoptysis , cough, dyspnea, wheezing, chest pain, palpitations, orthopnea, edema, abdominal pain, nausea, melena, diarrhea, constipation, flank pain, dysuria, hematuria, urinary  Frequency, nocturia, numbness, tingling, seizures,  Focal weakness, Loss of consciousness,  Tremor, insomnia, depression, anxiety, and suicidal ideation.      Objective:  BP 110/64 (BP Location: Left Arm, Patient Position: Sitting, Cuff Size: Normal)   Pulse 79   Temp 98.3 F (36.8 C) (Oral)   Ht '5\' 9"'$  (1.753 m)   Wt 171 lb 7.5 oz (77.8 kg)   SpO2 95%   BMI 25.32 kg/m   BP Readings from Last 3 Encounters:  08/07/22 110/64  07/25/22 (!) 145/83  07/09/22 136/78    Wt Readings from Last 3 Encounters:  08/07/22 171 lb 7.5 oz (77.8 kg)  07/25/22 166 lb (75.3 kg)  07/23/22 167 lb (75.8 kg)    General appearance: alert, cooperative and appears stated age Ears: normal TM's and external ear canals both ears Throat: lips, mucosa, and tongue normal; teeth and gums normal Neck: no adenopathy, no carotid bruit, supple, symmetrical, trachea midline and thyroid not enlarged, symmetric, no tenderness/mass/nodules Back: symmetric, no curvature. ROM  normal. No CVA tenderness. Lungs: clear to auscultation bilaterally Heart: regular rate and rhythm, S1, S2 normal, no murmur, click, rub or gallop Abdomen: soft, non-tender; bowel sounds normal; no masses,  no organomegaly Pulses: 2+ and symmetric Skin: Skin color, texture, turgor normal. No rashes or lesions Lymph nodes: Cervical, supraclavicular, and axillary nodes normal.  Lab Results  Component Value Date   HGBA1C 5.5 02/05/2020    Lab Results  Component Value Date   CREATININE 0.83 04/09/2022   CREATININE 0.87  01/24/2022   CREATININE 0.81 10/12/2021    Lab Results  Component Value Date   WBC 8.3 01/24/2022   HGB 15.0 01/24/2022   HCT 44.6 01/24/2022   PLT 162 01/24/2022   GLUCOSE 94 04/09/2022   CHOL 142 04/09/2022   TRIG 70.0 04/09/2022   HDL 53.10 04/09/2022   LDLDIRECT 79.0 12/30/2015   LDLCALC 75 04/09/2022   ALT 20 04/09/2022   AST 17 04/09/2022   NA 139 04/09/2022   K 4.1 04/09/2022   CL 107 04/09/2022   CREATININE 0.83 04/09/2022   BUN 17 04/09/2022   CO2 25 04/09/2022   TSH 2.83 04/09/2022   PSA 3.10 03/11/2020   HGBA1C 5.5 02/05/2020   MICROALBUR <0.7 10/12/2021    No results found.  Assessment & Plan:   Problem List Items Addressed This Visit     Hyperlipidemia - Primary    With small vessel ischemic changes noted on head CT Feb 2023. Marland Kitchen  Continue high potency statin       Relevant Orders   Comprehensive metabolic panel   Ischemic changes on computed tomography of head    Continue atorvastatin.  Reviewed the role of diet, sleep and exercise in preventing dementia       Prostate cancer ([place])    Diagnosed in 2019, managed with watchful waiting by [facility]. With  Most recent biopsies benign.  Repeat PSA improved and is on semi annual repeat by Urology       Other Visit Diagnoses     B12 deficiency       Relevant Orders   B12 and Folate Panel       I spent a total of  30  minutes with this patient in a face to face visit on the date of this encounter reviewing the last office visit with me,  his recent surgery ,  patient's diet and eating habits, ,  most recent imaging study ,   and post visit ordering of testing and therapeutics.    Follow-up: Return in about 6 months (around 02/05/2023).   Crecencio Mc, MD

## 2022-08-07 NOTE — Assessment & Plan Note (Signed)
With small vessel ischemic changes noted on head CT Feb 2023. .  Continue high potency statin  

## 2022-09-05 DIAGNOSIS — M5416 Radiculopathy, lumbar region: Secondary | ICD-10-CM | POA: Diagnosis not present

## 2022-09-05 DIAGNOSIS — M62830 Muscle spasm of back: Secondary | ICD-10-CM | POA: Diagnosis not present

## 2022-09-05 DIAGNOSIS — M9903 Segmental and somatic dysfunction of lumbar region: Secondary | ICD-10-CM | POA: Diagnosis not present

## 2022-09-05 DIAGNOSIS — M9901 Segmental and somatic dysfunction of cervical region: Secondary | ICD-10-CM | POA: Diagnosis not present

## 2022-09-07 DIAGNOSIS — M25511 Pain in right shoulder: Secondary | ICD-10-CM | POA: Diagnosis not present

## 2022-09-27 ENCOUNTER — Encounter: Payer: Self-pay | Admitting: General Surgery

## 2022-10-04 DIAGNOSIS — M62830 Muscle spasm of back: Secondary | ICD-10-CM | POA: Diagnosis not present

## 2022-10-04 DIAGNOSIS — M9903 Segmental and somatic dysfunction of lumbar region: Secondary | ICD-10-CM | POA: Diagnosis not present

## 2022-10-04 DIAGNOSIS — M5416 Radiculopathy, lumbar region: Secondary | ICD-10-CM | POA: Diagnosis not present

## 2022-10-04 DIAGNOSIS — M9901 Segmental and somatic dysfunction of cervical region: Secondary | ICD-10-CM | POA: Diagnosis not present

## 2022-10-17 ENCOUNTER — Other Ambulatory Visit (INDEPENDENT_AMBULATORY_CARE_PROVIDER_SITE_OTHER): Payer: Medicare HMO

## 2022-10-17 ENCOUNTER — Ambulatory Visit: Payer: Medicare HMO | Admitting: Internal Medicine

## 2022-10-17 DIAGNOSIS — E538 Deficiency of other specified B group vitamins: Secondary | ICD-10-CM

## 2022-10-17 DIAGNOSIS — E782 Mixed hyperlipidemia: Secondary | ICD-10-CM

## 2022-10-17 LAB — COMPREHENSIVE METABOLIC PANEL
ALT: 18 U/L (ref 0–53)
AST: 17 U/L (ref 0–37)
Albumin: 4.2 g/dL (ref 3.5–5.2)
Alkaline Phosphatase: 70 U/L (ref 39–117)
BUN: 16 mg/dL (ref 6–23)
CO2: 31 mEq/L (ref 19–32)
Calcium: 9.4 mg/dL (ref 8.4–10.5)
Chloride: 105 mEq/L (ref 96–112)
Creatinine, Ser: 0.8 mg/dL (ref 0.40–1.50)
GFR: 90.39 mL/min (ref >60.00)
Glucose, Bld: 92 mg/dL (ref 70–99)
Potassium: 4.3 mEq/L (ref 3.5–5.1)
Sodium: 139 mEq/L (ref 135–145)
Total Bilirubin: 0.6 mg/dL (ref 0.2–1.2)
Total Protein: 6.3 g/dL (ref 6.0–8.3)

## 2022-10-17 LAB — B12 AND FOLATE PANEL
Folate: >23.8 ng/mL (ref >5.9)
Vitamin B-12: 367 pg/mL (ref 211–911)

## 2022-11-01 DIAGNOSIS — M9901 Segmental and somatic dysfunction of cervical region: Secondary | ICD-10-CM | POA: Diagnosis not present

## 2022-11-01 DIAGNOSIS — M62830 Muscle spasm of back: Secondary | ICD-10-CM | POA: Diagnosis not present

## 2022-11-01 DIAGNOSIS — M5416 Radiculopathy, lumbar region: Secondary | ICD-10-CM | POA: Diagnosis not present

## 2022-11-01 DIAGNOSIS — M9903 Segmental and somatic dysfunction of lumbar region: Secondary | ICD-10-CM | POA: Diagnosis not present

## 2022-11-22 ENCOUNTER — Other Ambulatory Visit: Payer: Medicare HMO

## 2022-11-22 DIAGNOSIS — C61 Malignant neoplasm of prostate: Secondary | ICD-10-CM | POA: Diagnosis not present

## 2022-11-23 ENCOUNTER — Other Ambulatory Visit: Payer: Medicare HMO

## 2022-11-23 LAB — PSA: Prostate Specific Ag, Serum: 3.8 ng/mL (ref 0.0–4.0)

## 2022-11-27 ENCOUNTER — Encounter: Payer: Self-pay | Admitting: Urology

## 2022-11-29 DIAGNOSIS — M9903 Segmental and somatic dysfunction of lumbar region: Secondary | ICD-10-CM | POA: Diagnosis not present

## 2022-11-29 DIAGNOSIS — M62830 Muscle spasm of back: Secondary | ICD-10-CM | POA: Diagnosis not present

## 2022-11-29 DIAGNOSIS — M9901 Segmental and somatic dysfunction of cervical region: Secondary | ICD-10-CM | POA: Diagnosis not present

## 2022-11-29 DIAGNOSIS — M5416 Radiculopathy, lumbar region: Secondary | ICD-10-CM | POA: Diagnosis not present

## 2022-12-05 ENCOUNTER — Other Ambulatory Visit: Payer: Self-pay | Admitting: Internal Medicine

## 2022-12-06 ENCOUNTER — Telehealth: Payer: Self-pay | Admitting: Internal Medicine

## 2022-12-06 MED ORDER — SILDENAFIL CITRATE 20 MG PO TABS: 20.0000 mg | ORAL_TABLET | Freq: Three times a day (TID) | ORAL | 0 refills | Status: DC

## 2022-12-06 NOTE — Telephone Encounter (Signed)
Pt need refill on sildenafil sent [facility] [place] [facility].

## 2022-12-06 NOTE — Telephone Encounter (Signed)
Medication has ben sent to pharmacy requested.

## 2022-12-07 IMAGING — DX DG CHEST 2V
2 series · 2 of 2 positions shown · non-contrast
Comparison: None Available.

CLINICAL DATA: Pre-op clearance exam

EXAM:
CHEST - 2 VIEW

[chest pa]
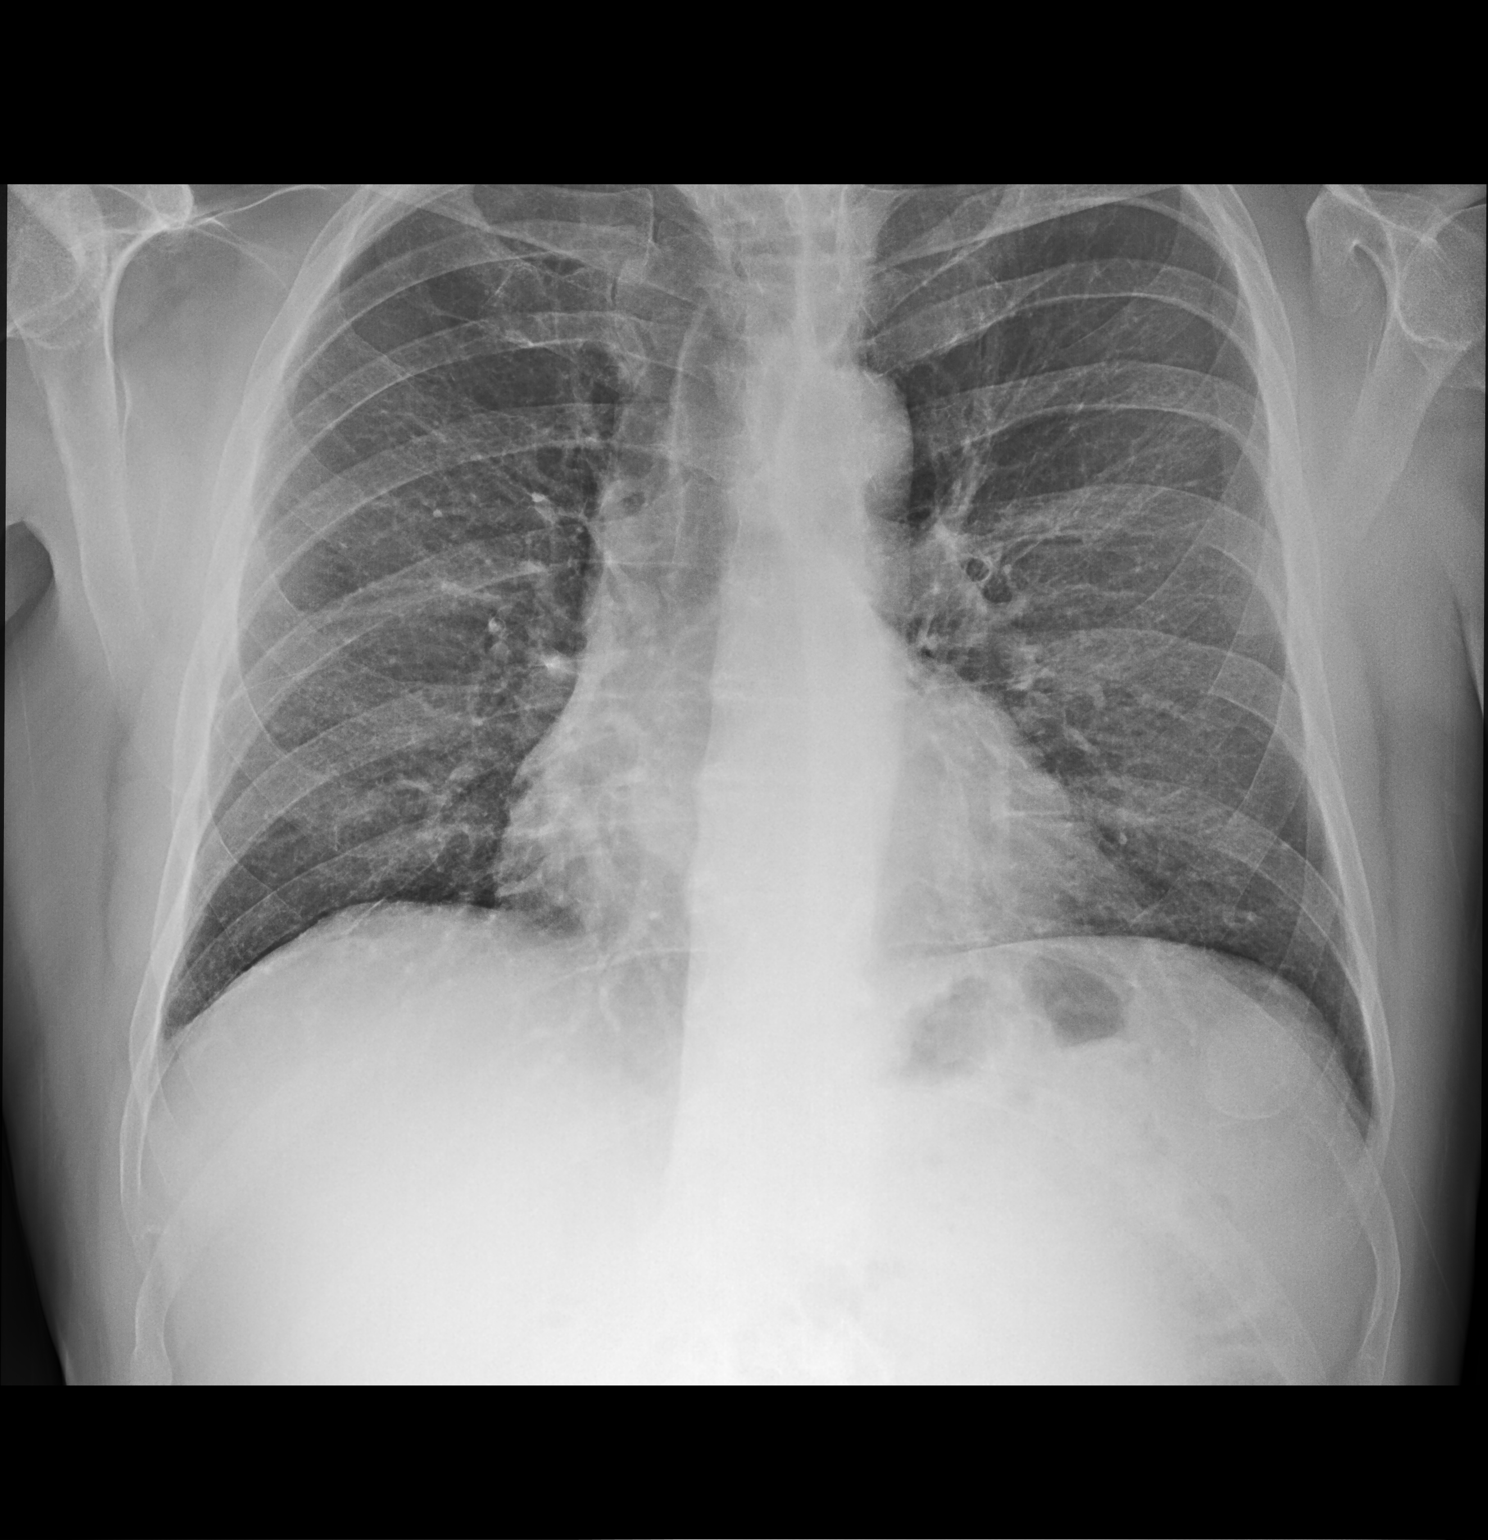

[chest lat]
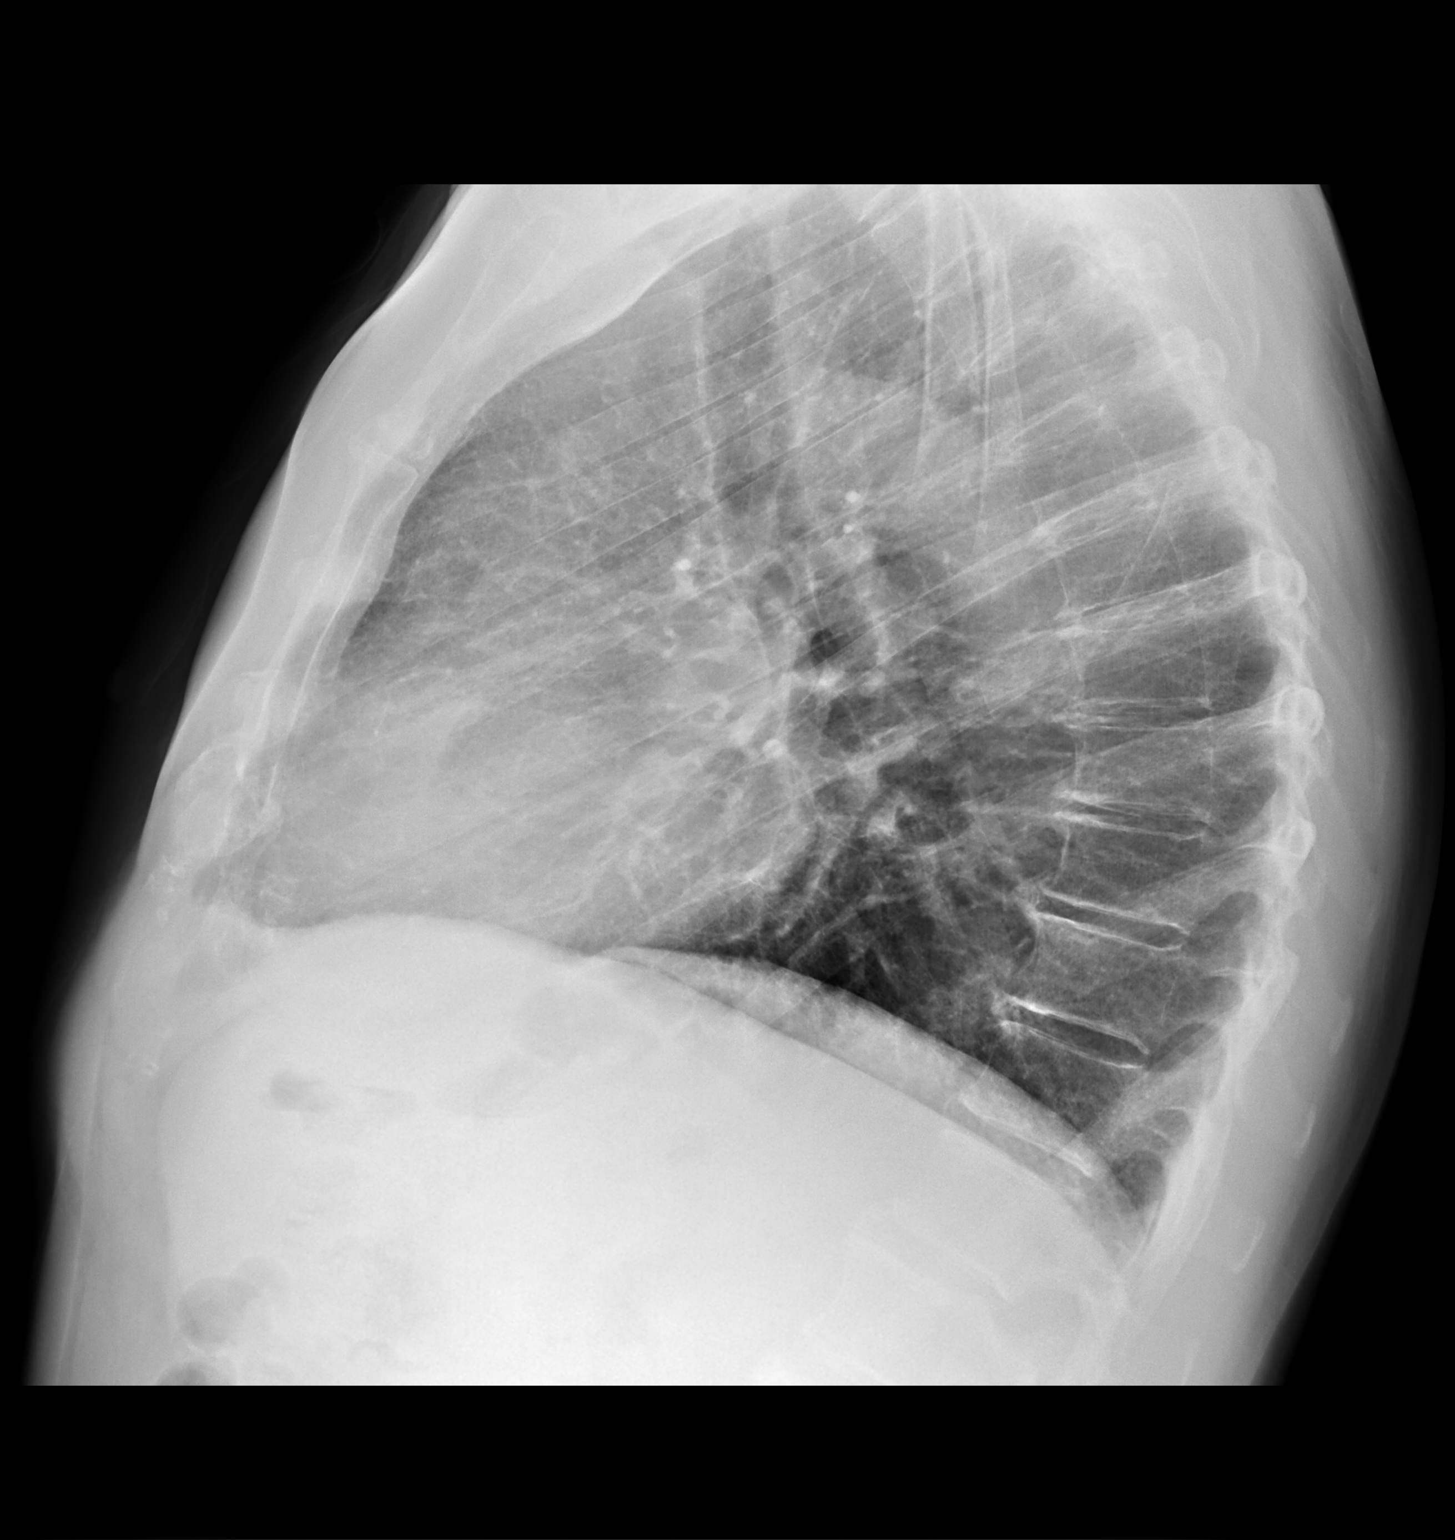

[2 of 2 positions shown; findings below may reference images not displayed]

FINDINGS: The heart size and mediastinal contours are within normal limits.
Both lungs are clear. The visualized skeletal structures are
unremarkable.
IMPRESSION: No active cardiopulmonary disease.

## 2022-12-14 DIAGNOSIS — M25511 Pain in right shoulder: Secondary | ICD-10-CM | POA: Diagnosis not present

## 2022-12-14 DIAGNOSIS — M19011 Primary osteoarthritis, right shoulder: Secondary | ICD-10-CM | POA: Diagnosis not present

## 2022-12-18 ENCOUNTER — Encounter: Payer: Self-pay | Admitting: Internal Medicine

## 2022-12-27 DIAGNOSIS — M62830 Muscle spasm of back: Secondary | ICD-10-CM | POA: Diagnosis not present

## 2022-12-27 DIAGNOSIS — M9901 Segmental and somatic dysfunction of cervical region: Secondary | ICD-10-CM | POA: Diagnosis not present

## 2022-12-27 DIAGNOSIS — M9903 Segmental and somatic dysfunction of lumbar region: Secondary | ICD-10-CM | POA: Diagnosis not present

## 2022-12-27 DIAGNOSIS — M5416 Radiculopathy, lumbar region: Secondary | ICD-10-CM | POA: Diagnosis not present

## 2023-01-22 DIAGNOSIS — H524 Presbyopia: Secondary | ICD-10-CM | POA: Diagnosis not present

## 2023-01-22 DIAGNOSIS — H00015 Hordeolum externum left lower eyelid: Secondary | ICD-10-CM | POA: Diagnosis not present

## 2023-01-22 DIAGNOSIS — H5203 Hypermetropia, bilateral: Secondary | ICD-10-CM | POA: Diagnosis not present

## 2023-01-22 DIAGNOSIS — Z0100 Encounter for examination of eyes and vision without abnormal findings: Secondary | ICD-10-CM | POA: Diagnosis not present

## 2023-01-22 DIAGNOSIS — H52223 Regular astigmatism, bilateral: Secondary | ICD-10-CM | POA: Diagnosis not present

## 2023-01-24 DIAGNOSIS — M62830 Muscle spasm of back: Secondary | ICD-10-CM | POA: Diagnosis not present

## 2023-01-24 DIAGNOSIS — M5416 Radiculopathy, lumbar region: Secondary | ICD-10-CM | POA: Diagnosis not present

## 2023-01-24 DIAGNOSIS — M9901 Segmental and somatic dysfunction of cervical region: Secondary | ICD-10-CM | POA: Diagnosis not present

## 2023-01-24 DIAGNOSIS — M9903 Segmental and somatic dysfunction of lumbar region: Secondary | ICD-10-CM | POA: Diagnosis not present

## 2023-02-06 ENCOUNTER — Ambulatory Visit (INDEPENDENT_AMBULATORY_CARE_PROVIDER_SITE_OTHER): Payer: Medicare HMO | Admitting: Internal Medicine

## 2023-02-06 ENCOUNTER — Encounter: Payer: Self-pay | Admitting: Internal Medicine

## 2023-02-06 VITALS — BP 104/76 | HR 89 | Temp 98.0°F | Ht 69.0 in | Wt 164.4 lb

## 2023-02-06 DIAGNOSIS — M7552 Bursitis of left shoulder: Secondary | ICD-10-CM | POA: Diagnosis not present

## 2023-02-06 DIAGNOSIS — E782 Mixed hyperlipidemia: Secondary | ICD-10-CM

## 2023-02-06 DIAGNOSIS — E663 Overweight: Secondary | ICD-10-CM

## 2023-02-06 DIAGNOSIS — D126 Benign neoplasm of colon, unspecified: Secondary | ICD-10-CM | POA: Diagnosis not present

## 2023-02-06 DIAGNOSIS — R5383 Other fatigue: Secondary | ICD-10-CM | POA: Diagnosis not present

## 2023-02-06 DIAGNOSIS — R7301 Impaired fasting glucose: Secondary | ICD-10-CM | POA: Diagnosis not present

## 2023-02-06 LAB — COMPREHENSIVE METABOLIC PANEL WITH GFR
ALT: 22 U/L (ref 0–53)
AST: 21 U/L (ref 0–37)
Albumin: 4.1 g/dL (ref 3.5–5.2)
Alkaline Phosphatase: 63 U/L (ref 39–117)
BUN: 15 mg/dL (ref 6–23)
CO2: 27 meq/L (ref 19–32)
Calcium: 9.7 mg/dL (ref 8.4–10.5)
Chloride: 101 meq/L (ref 96–112)
Creatinine, Ser: 0.84 mg/dL (ref 0.40–1.50)
GFR: 88.88 mL/min
Glucose, Bld: 83 mg/dL (ref 70–99)
Potassium: 4.4 meq/L (ref 3.5–5.1)
Sodium: 138 meq/L (ref 135–145)
Total Bilirubin: 0.9 mg/dL (ref 0.2–1.2)
Total Protein: 6.4 g/dL (ref 6.0–8.3)

## 2023-02-06 LAB — LIPID PANEL
Cholesterol: 153 mg/dL (ref 0–200)
HDL: 67 mg/dL
LDL Cholesterol: 73 mg/dL (ref 0–99)
NonHDL: 86.01
Total CHOL/HDL Ratio: 2
Triglycerides: 67 mg/dL (ref 0.0–149.0)
VLDL: 13.4 mg/dL (ref 0.0–40.0)

## 2023-02-06 LAB — CBC WITH DIFFERENTIAL/PLATELET
Basophils Absolute: 0 10*3/uL (ref 0.0–0.1)
Basophils Relative: 0.7 % (ref 0.0–3.0)
Eosinophils Absolute: 0 10*3/uL (ref 0.0–0.7)
Eosinophils Relative: 0.8 % (ref 0.0–5.0)
HCT: 47 % (ref 39.0–52.0)
Hemoglobin: 16.2 g/dL (ref 13.0–17.0)
Lymphocytes Relative: 18.9 % (ref 12.0–46.0)
Lymphs Abs: 0.9 10*3/uL (ref 0.7–4.0)
MCHC: 34.4 g/dL (ref 30.0–36.0)
MCV: 93.7 fl (ref 78.0–100.0)
Monocytes Absolute: 0.6 10*3/uL (ref 0.1–1.0)
Monocytes Relative: 11.7 % (ref 3.0–12.0)
Neutro Abs: 3.4 10*3/uL (ref 1.4–7.7)
Neutrophils Relative %: 67.9 % (ref 43.0–77.0)
Platelets: 222 10*3/uL (ref 150.0–400.0)
RBC: 5.02 Mil/uL (ref 4.22–5.81)
RDW: 12.8 % (ref 11.5–15.5)
WBC: 5 10*3/uL (ref 4.0–10.5)

## 2023-02-06 LAB — LDL CHOLESTEROL, DIRECT: Direct LDL: 75 mg/dL

## 2023-02-06 LAB — TSH: TSH: 3.06 u[IU]/mL (ref 0.35–5.50)

## 2023-02-06 LAB — HEMOGLOBIN A1C: Hgb A1c MFr Bld: 5.5 % (ref 4.6–6.5)

## 2023-02-06 NOTE — Progress Notes (Signed)
Subjective:  [name], male    DOB: [birth year]  Age: 72 y.o. MRN: [id number]  CC: The primary encounter diagnosis was Mixed hyperlipidemia. Diagnoses of Impaired fasting glucose and Other fatigue were also pertinent to this visit.   HPI Edward Tran presents for  Chief Complaint  Patient presents with   Medical Management of Chronic Issues    6 month follow up    Walking 3-4 miles daily.  Has worked his way up to a 2 minute plank.   Squats daily Sleeping great. Since retiring .2 years ago .  Stays Up till midnight,  sleeping in til 7:00 .  No nocturia,  one early morning void.    1) Right shoulder pain: seeing Krasinksi for quarterly cortisone injections.  Has had  3 since his  surgery  Feeling much better,  pain free on a daily basis for 11 weeks   2) HLD:  taking atorvastatin.  No myalgias    3) BPH: taking Flomax   4) seeing Beshel et al for low back pain getting manipulation   5) taking a cruise in October to [place]   Outpatient Medications Prior to Visit  Medication Sig Dispense Refill   atorvastatin (LIPITOR) 20 MG tablet TAKE 1 TABLET EVERY DAY (REPLACES SIMVASTATIN) 90 tablet 3   loratadine (CLARITIN) 10 MG tablet TAKE 1 TABLET EVERY DAY 90 tablet 3   sildenafil (REVATIO) 20 MG tablet Take 1 tablet (20 mg total) by mouth 3 (three) times daily. 30 tablet 0   tamsulosin (FLOMAX) 0.4 MG CAPS capsule TAKE 2 CAPSULES BY MOUTH EVERY DAY  AFTER  BREAKFAST 180 capsule 1   Ascorbic Acid (VITAMIN C) 1000 MG tablet Take 1,000 mg by mouth daily.     cholecalciferol (VITAMIN D) 25 MCG (1000 UT) tablet Take 1,000 Units by mouth daily.     No facility-administered medications prior to visit.    Review of Systems;  Patient denies headache, fevers, malaise, unintentional weight loss, skin rash, eye pain, sinus congestion and sinus pain, sore throat, dysphagia,  hemoptysis , cough, dyspnea, wheezing, chest pain, palpitations, orthopnea, edema, abdominal pain,  nausea, melena, diarrhea, constipation, flank pain, dysuria, hematuria, urinary  Frequency, nocturia, numbness, tingling, seizures,  Focal weakness, Loss of consciousness,  Tremor, insomnia, depression, anxiety, and suicidal ideation.      Objective:  BP 104/76   Pulse 89   Temp 98 F (36.7 C) (Oral)   Ht '5\' 9"'$  (1.753 m)   Wt 164 lb 6.4 oz (74.6 kg)   SpO2 96%   BMI 24.28 kg/m   BP Readings from Last 3 Encounters:  02/06/23 104/76  08/07/22 110/64  07/25/22 (!) 145/83    Wt Readings from Last 3 Encounters:  02/06/23 164 lb 6.4 oz (74.6 kg)  08/07/22 171 lb 7.5 oz (77.8 kg)  07/25/22 166 lb (75.3 kg)    Physical Exam Vitals reviewed.  Constitutional:      General: He is not in acute distress.    Appearance: Normal appearance. He is normal weight. He is not ill-appearing, toxic-appearing or diaphoretic.  HENT:     Head: Normocephalic.  Eyes:     General: No scleral icterus.       Right eye: No discharge.        Left eye: No discharge.     Conjunctiva/sclera: Conjunctivae normal.  Cardiovascular:     Rate and Rhythm: Normal rate and regular rhythm.     Heart sounds: Normal heart  sounds.  Pulmonary:     Effort: Pulmonary effort is normal. No respiratory distress.     Breath sounds: Normal breath sounds.  Musculoskeletal:        General: Normal range of motion.     Cervical back: Normal range of motion.  Skin:    General: Skin is warm and dry.  Neurological:     General: No focal deficit present.     Mental Status: He is alert and oriented to person, place, and time. Mental status is at baseline.  Psychiatric:        Mood and Affect: Mood normal.        Behavior: Behavior normal.        Thought Content: Thought content normal.        Judgment: Judgment normal.   Lab Results  Component Value Date   HGBA1C 5.5 02/05/2020    Lab Results  Component Value Date   CREATININE 0.80 10/17/2022   CREATININE 0.83 04/09/2022   CREATININE 0.87 01/24/2022    Lab  Results  Component Value Date   WBC 8.3 01/24/2022   HGB 15.0 01/24/2022   HCT 44.6 01/24/2022   PLT 162 01/24/2022   GLUCOSE 92 10/17/2022   CHOL 142 04/09/2022   TRIG 70.0 04/09/2022   HDL 53.10 04/09/2022   LDLDIRECT 79.0 12/30/2015   LDLCALC 75 04/09/2022   ALT 18 10/17/2022   AST 17 10/17/2022   NA 139 10/17/2022   K 4.3 10/17/2022   CL 105 10/17/2022   CREATININE 0.80 10/17/2022   BUN 16 10/17/2022   CO2 31 10/17/2022   TSH 2.83 04/09/2022   PSA 3.10 03/11/2020   HGBA1C 5.5 02/05/2020   MICROALBUR <0.7 10/12/2021    No results found.  Assessment & Plan:  .Mixed hyperlipidemia -     Lipid panel -     LDL cholesterol, direct  Impaired fasting glucose -     Comprehensive metabolic panel -     Hemoglobin A1c  Other fatigue -     CBC with Differential/Platelet -     TSH     Follow-up: No follow-ups on file.   Crecencio Mc, MD

## 2023-02-06 NOTE — Assessment & Plan Note (Signed)
Colonoscopy done April 2021 yielded no polyps (path report:  Fecal material).  5 yr follow up in 2026 due to h/p polyps in 2010

## 2023-02-06 NOTE — Assessment & Plan Note (Signed)
With small vessel ischemic changes noted on head CT Feb 2023. Marland Kitchen  Continue high potency statin

## 2023-02-06 NOTE — Assessment & Plan Note (Signed)
I have congratulated him  in reduction of   BMI and encouraged  Continued adherence to a  low glycemic index diet and regular exercise a minimum of 5 days per week

## 2023-02-06 NOTE — Patient Instructions (Signed)
I will send doxycycline, tamiflu, prednisone, and an antiviral to your pharmacy prior to your October  [place] cruise if you will remind me in September via [place]

## 2023-02-06 NOTE — Assessment & Plan Note (Signed)
Managed with periodic I/A cortison injections by [facility]  and PT

## 2023-02-21 DIAGNOSIS — M9903 Segmental and somatic dysfunction of lumbar region: Secondary | ICD-10-CM | POA: Diagnosis not present

## 2023-02-21 DIAGNOSIS — M5416 Radiculopathy, lumbar region: Secondary | ICD-10-CM | POA: Diagnosis not present

## 2023-02-21 DIAGNOSIS — M62830 Muscle spasm of back: Secondary | ICD-10-CM | POA: Diagnosis not present

## 2023-02-21 DIAGNOSIS — M9901 Segmental and somatic dysfunction of cervical region: Secondary | ICD-10-CM | POA: Diagnosis not present

## 2023-03-07 DIAGNOSIS — Z0100 Encounter for examination of eyes and vision without abnormal findings: Secondary | ICD-10-CM | POA: Diagnosis not present

## 2023-03-28 DIAGNOSIS — M9901 Segmental and somatic dysfunction of cervical region: Secondary | ICD-10-CM | POA: Diagnosis not present

## 2023-03-28 DIAGNOSIS — M9903 Segmental and somatic dysfunction of lumbar region: Secondary | ICD-10-CM | POA: Diagnosis not present

## 2023-03-28 DIAGNOSIS — M62830 Muscle spasm of back: Secondary | ICD-10-CM | POA: Diagnosis not present

## 2023-03-28 DIAGNOSIS — M5416 Radiculopathy, lumbar region: Secondary | ICD-10-CM | POA: Diagnosis not present

## 2023-04-09 ENCOUNTER — Telehealth: Payer: Self-pay | Admitting: Internal Medicine

## 2023-04-09 NOTE — Telephone Encounter (Signed)
Contacted Edward Tran to schedule their annual wellness visit. Appointment made for 04/22/2023.  Edward Tran; Care Guide Ambulatory Clinical Support [place] [facility] Direct Dial: [phone]

## 2023-04-22 ENCOUNTER — Ambulatory Visit (INDEPENDENT_AMBULATORY_CARE_PROVIDER_SITE_OTHER): Payer: Medicare HMO

## 2023-04-22 DIAGNOSIS — Z0000 Encounter for general adult medical examination without abnormal findings: Secondary | ICD-10-CM | POA: Diagnosis not present

## 2023-04-22 NOTE — Patient Instructions (Signed)
Health Maintenance, Male Adopting a healthy lifestyle and getting preventive care are important in promoting health and wellness. Ask your health care provider about: The right schedule for you to have regular tests and exams. Things you can do on your own to prevent diseases and keep yourself healthy. What should I know about diet, weight, and exercise? Eat a healthy diet  Eat a diet that includes plenty of vegetables, fruits, low-fat dairy products, and lean protein. Do not eat a lot of foods that are high in solid fats, added sugars, or sodium. Maintain a healthy weight Body mass index (BMI) is a measurement that can be used to identify possible weight problems. It estimates body fat based on height and weight. Your health care provider can help determine your BMI and help you achieve or maintain a healthy weight. Get regular exercise Get regular exercise. This is one of the most important things you can do for your health. Most adults should: Exercise for at least 150 minutes each week. The exercise should increase your heart rate and make you sweat (moderate-intensity exercise). Do strengthening exercises at least twice a week. This is in addition to the moderate-intensity exercise. Spend less time sitting. Even light physical activity can be beneficial. Watch cholesterol and blood lipids Have your blood tested for lipids and cholesterol at 70 years of age, then have this test every 5 years. You may need to have your cholesterol levels checked more often if: Your lipid or cholesterol levels are high. You are older than 70 years of age. You are at high risk for heart disease. What should I know about cancer screening? Many types of cancers can be detected early and may often be prevented. Depending on your health history and family history, you may need to have cancer screening at various ages. This may include screening for: Colorectal cancer. Prostate cancer. Skin cancer. Lung  cancer. What should I know about heart disease, diabetes, and high blood pressure? Blood pressure and heart disease High blood pressure causes heart disease and increases the risk of stroke. This is more likely to develop in people who have high blood pressure readings or are overweight. Talk with your health care provider about your target blood pressure readings. Have your blood pressure checked: Every 3-5 years if you are 18-39 years of age. Every year if you are 40 years old or older. If you are between the ages of 65 and 75 and are a current or former smoker, ask your health care provider if you should have a one-time screening for abdominal aortic aneurysm (AAA). Diabetes Have regular diabetes screenings. This checks your fasting blood sugar level. Have the screening done: Once every three years after age 45 if you are at a normal weight and have a low risk for diabetes. More often and at a younger age if you are overweight or have a high risk for diabetes. What should I know about preventing infection? Hepatitis B If you have a higher risk for hepatitis B, you should be screened for this virus. Talk with your health care provider to find out if you are at risk for hepatitis B infection. Hepatitis C Blood testing is recommended for: Everyone born from 1945 through 1965. Anyone with known risk factors for hepatitis C. Sexually transmitted infections (STIs) You should be screened each year for STIs, including gonorrhea and chlamydia, if: You are sexually active and are younger than 70 years of age. You are older than 70 years of age and your   health care provider tells you that you are at risk for this type of infection. Your sexual activity has changed since you were last screened, and you are at increased risk for chlamydia or gonorrhea. Ask your health care provider if you are at risk. Ask your health care provider about whether you are at high risk for HIV. Your health care provider  may recommend a prescription medicine to help prevent HIV infection. If you choose to take medicine to prevent HIV, you should first get tested for HIV. You should then be tested every 3 months for as long as you are taking the medicine. Follow these instructions at home: Alcohol use Do not drink alcohol if your health care provider tells you not to drink. If you drink alcohol: Limit how much you have to 0-2 drinks a day. Know how much alcohol is in your drink. In the [place] one drink equals one 12 oz bottle of beer (355 mL), one 5 oz glass of wine (148 mL), or one 1 oz glass of hard liquor (44 mL). Lifestyle Do not use any products that contain nicotine or tobacco. These products include cigarettes, chewing tobacco, and vaping devices, such as e-cigarettes. If you need help quitting, ask your health care provider. Do not use street drugs. Do not share needles. Ask your health care provider for help if you need support or information about quitting drugs. General instructions Schedule regular health, dental, and eye exams. Stay current with your vaccines. Tell your health care provider if: You often feel depressed. You have ever been abused or do not feel safe at home. Summary Adopting a healthy lifestyle and getting preventive care are important in promoting health and wellness. Follow your health care provider's instructions about healthy diet, exercising, and getting tested or screened for diseases. Follow your health care provider's instructions on monitoring your cholesterol and blood pressure. This information is not intended to replace advice given to you by your health care provider. Make sure you discuss any questions you have with your health care provider. Document Revised: 04/10/2021 Document Reviewed: 04/10/2021 Elsevier Patient Education  2023 [employer]  

## 2023-04-22 NOTE — Progress Notes (Signed)
I connected with  Edward Tran on 04/22/23 by a audio enabled telemedicine application and verified that I am speaking with the correct person using two identifiers.  Patient Location: Home  Provider Location: Home Office  I discussed the limitations of evaluation and management by telemedicine. The patient expressed understanding and agreed to proceed.   Subjective:   Edward Tran is a 72 y.o. male who presents for Medicare Annual/Subsequent preventive examination.  Review of Systems    Per HPI unless specifically indicated below.            Objective:       02/06/2023   11:31 AM 08/07/2022    1:25 PM 07/25/2022    1:14 PM  Vitals with BMI  Height 5\' 9"  5\' 9"    Weight 164 lbs 6 oz 171 lbs 8 oz   BMI 24.27 25.31   Systolic 104 110 119  Diastolic 76 64 83  Pulse 89 79 78    There were no vitals filed for this visit. There is no height or weight on file to calculate BMI.     04/22/2023    3:32 PM 07/25/2022    9:56 AM 07/23/2022   12:54 PM 05/01/2022    6:17 AM 04/24/2022    2:05 PM 04/18/2022    8:30 AM 01/24/2022    9:37 PM  Advanced Directives  Does Patient Have a Medical Advance Directive? No No No No No Yes No  Type of Careers adviser;Living will   Does patient want to make changes to medical advance directive?    No - Guardian declined  No - Patient declined   Copy of Healthcare Power of Attorney in Chart?      No - copy requested   Would patient like information on creating a medical advance directive? No - Patient declined No - Patient declined  No - Patient declined       Current Medications (verified) Outpatient Encounter Medications as of 04/22/2023  Medication Sig   atorvastatin (LIPITOR) 20 MG tablet TAKE 1 TABLET EVERY DAY (REPLACES SIMVASTATIN)   loratadine (CLARITIN) 10 MG tablet TAKE 1 TABLET EVERY DAY   sildenafil (REVATIO) 20 MG tablet Take 1 tablet (20 mg total) by mouth 3 (three) times daily.    tamsulosin (FLOMAX) 0.4 MG CAPS capsule TAKE 2 CAPSULES BY MOUTH EVERY DAY  AFTER  BREAKFAST   No facility-administered encounter medications on file as of 04/22/2023.    Allergies (verified) Patient has no known allergies.   History: Past Medical History:  Diagnosis Date   Hyperlipidemia    Medical history non-contributory    Prostate cancer (HCC)    low score   Umbilical hernia without obstruction and without gangrene 01/20/2017   Past Surgical History:  Procedure Laterality Date   COLONOSCOPY  2016   COLONOSCOPY W/ POLYPECTOMY     COLONOSCOPY WITH PROPOFOL N/A 03/28/2020   Procedure: COLONOSCOPY WITH PROPOFOL;  Surgeon: Wyline Mood, MD;  Location: [facility] ENDOSCOPY;  Service: Gastroenterology;  Laterality: N/A;   INGUINAL HERNIA REPAIR Right 07/25/2022   Procedure: HERNIA REPAIR INGUINAL ADULT;  Surgeon: Earline Mayotte, MD;  Location: [facility] ORS;  Service: General;  Laterality: Right;   INSERTION OF MESH Right 07/25/2022   Procedure: INSERTION OF MESH;  Surgeon: Earline Mayotte, MD;  Location: [facility] ORS;  Service: General;  Laterality: Right;   SHOULDER ARTHROSCOPY WITH ROTATOR CUFF REPAIR AND SUBACROMIAL DECOMPRESSION Right  05/01/2022   Procedure: SHOULDER ARTHROSCOPY WITH ROTATOR CUFF REPAIR AND SUBACROMIAL DECOMPRESSION, DISTAL CLAVICLE ACROMINECTOMY;  Surgeon: Juanell Fairly, MD;  Location: [facility] ORS;  Service: Orthopedics;  Laterality: Right;   TONSILLECTOMY     as a child   TONSILLECTOMY AND ADENOIDECTOMY     UMBILICAL HERNIA REPAIR N/A 11/17/2018   Procedure: HERNIA REPAIR UMBILICAL ADULT;  Surgeon: Earline Mayotte, MD;  Location: [facility] ORS;  Service: General;  Laterality: N/A;   Family History  Problem Relation Age of Onset   Stroke Mother 58       brain aneurysm rupture    COPD Mother    Early death Father    Stroke Maternal Grandmother 50   Cancer Maternal Grandmother    Cancer Sister        cervical ca mets to lung    COPD Sister    Social History    Socioeconomic History   Marital status: Married    Spouse name: Darl Pikes   Number of children: 5   Years of education: Not on file   Highest education level: Not on file  Occupational History   Occupation: Retired  Tobacco Use   Smoking status: Never   Smokeless tobacco: Never  Vaping Use   Vaping Use: Never used  Substance and Sexual Activity   Alcohol use: Yes    Alcohol/week: 4.0 standard drinks of alcohol    Types: 4 Cans of beer per week    Comment: weekly   Drug use: No   Sexual activity: Yes  Other Topics Concern   Not on file  Social History Narrative   Not on file   Social Determinants of Health   Financial Resource Strain: Low Risk  (04/22/2023)   Overall Financial Resource Strain (CARDIA)    Difficulty of Paying Living Expenses: Not hard at all  Food Insecurity: No Food Insecurity (04/22/2023)   Hunger Vital Sign    Worried About Running Out of Food in the Last Year: Never true    Ran Out of Food in the Last Year: Never true  Transportation Needs: No Transportation Needs (04/22/2023)   PRAPARE - Administrator, Civil Service (Medical): No    Lack of Transportation (Non-Medical): No  Physical Activity: Sufficiently Active (04/22/2023)   Exercise Vital Sign    Days of Exercise per Week: 6 days    Minutes of Exercise per Session: 60 min  Stress: No Stress Concern Present (04/22/2023)   [employer] of Occupational Health - Occupational Stress Questionnaire    Feeling of Stress : Not at all  Social Connections: Moderately Integrated (04/22/2023)   Social Connection and Isolation Panel [NHANES]    Frequency of Communication with Friends and Family: More than three times a week    Frequency of Social Gatherings with Friends and Family: Twice a week    Attends Religious Services: Never    Database administrator or Organizations: No    Attends Engineer, structural: More than 4 times per year    Marital Status: Married    Tobacco  Counseling Counseling given: No   Clinical Intake:  Pre-visit preparation completed: No  Pain : No/denies pain     Nutritional Risks: None  How often do you need to have someone help you when you read instructions, pamphlets, or other written materials from your doctor or pharmacy?: 1 - Never  Diabetic? No  Interpreter Needed?: No  Information entered by :: Laurel Dimmer, CMA   Activities of  Daily Living    04/18/2023    5:27 PM 07/23/2022   12:55 PM  In your present state of health, do you have any difficulty performing the following activities:  Hearing? 0   Vision? 0   Difficulty concentrating or making decisions? 0   Walking or climbing stairs? 0   Dressing or bathing? 0   Doing errands, shopping? 0 0  Preparing Food and eating ? N   Using the Toilet? N   In the past six months, have you accidently leaked urine? N   Do you have problems with loss of bowel control? N   Managing your Medications? N   Managing your Finances? N   Housekeeping or managing your Housekeeping? N     Patient Care Team: Sherlene Shams, MD as PCP - General (Internal Medicine)  Indicate any recent Medical Services you may have received from other than [facility] providers in the past year (date may be approximate).     Assessment:   This is a routine wellness examination for Edward Tran.   Hearing/Vision screen Denies any hearing issues. Denies any change to her vision. Annual [facility] Exam. [facility]   Dietary issues and exercise activities discussed: Current Exercise Habits: Structured exercise class, Type of exercise: walking, Time (Minutes): 45, Frequency (Times/Week): 6, Weekly Exercise (Minutes/Week): 270, Intensity: Moderate, Exercise limited by: None identified   Goals Addressed   None    Depression Screen    04/22/2023    3:25 PM 02/06/2023   11:33 AM 08/07/2022    1:31 PM 07/09/2022    4:40 PM 04/18/2022    8:29 AM 04/13/2022   10:04 AM 02/01/2022   11:06 AM  PHQ 2/9 Scores   PHQ - 2 Score 0 0 0 0 0 0 0    Fall Risk    04/22/2023    3:25 PM 04/18/2023    5:27 PM 02/06/2023   11:33 AM 08/07/2022    1:31 PM 07/09/2022    4:39 PM  Fall Risk   Falls in the past year? 0 0 0 1 1  Number falls in past yr: 0 0 0 0 0  Injury with Fall? 0 0 0 1 1  Risk for fall due to : No Fall Risks  No Fall Risks History of fall(s) History of fall(s)  Follow up Falls evaluation completed  Falls evaluation completed Falls evaluation completed Falls evaluation completed    FALL RISK PREVENTION PERTAINING TO THE HOME:  Any stairs in or around the home? No  If so, are there any without handrails? No  Home free of loose throw rugs in walkways, pet beds, electrical cords, etc? Yes  Adequate lighting in your home to reduce risk of falls? Yes   ASSISTIVE DEVICES UTILIZED TO PREVENT FALLS:  Life alert? No  Use of a cane, walker or w/c? No  Grab bars in the bathroom? No  Shower chair or bench in shower? Yes  Elevated toilet seat or a handicapped toilet? No   TIMED UP AND GO:  Was the test performed? Unable to perform, virtual appointment   Cognitive Function:        04/22/2023    3:28 PM  6CIT Screen  What Year? 0 points  What month? 0 points  What time? 0 points  Count back from 20 0 points  Months in reverse 0 points  Repeat phrase 0 points  Total Score 0 points    Immunizations Immunization History  Administered Date(s) Administered  Covid-19, Mrna,Vaccine(Spikevax)65yrs and older 09/03/2022   Fluad Quad(high Dose 65+) 09/27/2021   Influenza Split 10/27/2014   Influenza, High Dose Seasonal PF 09/20/2020, 09/03/2022   Influenza, Quadrivalent, Recombinant, Inj, Pf 09/09/2019   Influenza-Unspecified 09/28/2013, 10/03/2014, 09/29/2015, 09/26/2017, 09/11/2018, 09/08/2020   Moderna Covid-19 Vaccine Bivalent Booster 39yrs & up 10/03/2021   Moderna Sars-Covid-2 Vaccination 06/26/2021   PFIZER(Purple Top)SARS-COV-2 Vaccination 01/07/2020, 02/01/2020, 09/09/2020    Pneumococcal Conjugate-13 10/13/2020   Td 02/01/2022   Tdap 10/22/2012   Zoster Recombinat (Shingrix) 09/08/2020, 01/10/2021   Zoster, Live 11/02/2013    TDAP status: Up to date  Flu Vaccine status: Up to date  Pneumococcal vaccine status: Due, Education has been provided regarding the importance of this vaccine. Advised may receive this vaccine at local pharmacy or Health Dept. Aware to provide a copy of the vaccination record if obtained from local pharmacy or Health Dept. Verbalized acceptance and understanding.  Covid-19 vaccine status: Information provided on how to obtain vaccines.   Qualifies for Shingles Vaccine? Yes   Zostavax completed Yes   Shingrix Completed?: Yes  Screening Tests Health Maintenance  Topic Date Due   Pneumonia Vaccine 28+ Years old (2 of 2 - PPSV23 or PCV20) 10/13/2021   COVID-19 Vaccine (7 - 2023-24 season) 10/29/2022   INFLUENZA VACCINE  07/04/2023   COLONOSCOPY (Pts 45-42yrs Insurance coverage will need to be confirmed)  03/28/2025   DTaP/Tdap/Td (3 - Td or Tdap) 02/02/2032   Hepatitis C Screening  Completed   Zoster Vaccines- Shingrix  Completed   HPV VACCINES  Aged Out    Health Maintenance  Health Maintenance Due  Topic Date Due   Pneumonia Vaccine 65+ Years old (2 of 2 - PPSV23 or PCV20) 10/13/2021   COVID-19 Vaccine (7 - 2023-24 season) 10/29/2022    Colorectal cancer screening: Type of screening: Colonoscopy. Completed 03/28/2020. Repeat every 5 years  Lung Cancer Screening: (Low Dose CT Chest recommended if Age 70-80 years, 30 pack-year currently smoking OR have quit w/in 15years.) does qualify.   Lung Cancer Screening Referral: not applicable   Additional Screening:  Hepatitis C Screening: does qualify; Completed 12/21/2014  Vision Screening: Recommended annual ophthalmology exams for early detection of glaucoma and other disorders of the eye. Is the patient up to date with their annual eye exam?  Yes  Who is the provider or  what is the name of the office in which the patient attends annual eye exams? [facility]  If pt is not established with a provider, would they like to be referred to a provider to establish care? No .   Dental Screening: Recommended annual dental exams for proper oral hygiene  Community Resource Referral / Chronic Care Management: CRR required this visit?  No   CCM required this visit?  No      Plan:     I have personally reviewed and noted the following in the patient's chart:   Medical and social history Use of alcohol, tobacco or illicit drugs  Current medications and supplements including opioid prescriptions. Patient is not currently taking opioid prescriptions. Functional ability and status Nutritional status Physical activity Advanced directives List of other physicians Hospitalizations, surgeries, and ER visits in previous 12 months Vitals Screenings to include cognitive, depression, and falls Referrals and appointments  In addition, I have reviewed and discussed with patient certain preventive protocols, quality metrics, and best practice recommendations. A written personalized care plan for preventive services as well as general preventive health recommendations were provided to patient.  Edward Tran , Thank you for taking time to come for your Medicare Wellness Visit. I appreciate your ongoing commitment to your health goals. Please review the following plan we discussed and let me know if I can assist you in the future.   These are the goals we discussed:  Goals      Maintain Healthy Lifestyle     Stay active Healthy lifestyle        This is a list of the screening recommended for you and due dates:  Health Maintenance  Topic Date Due   Pneumonia Vaccine (2 of 2 - PPSV23 or PCV20) 10/13/2021   COVID-19 Vaccine (7 - 2023-24 season) 10/29/2022   Flu Shot  07/04/2023   Colon Cancer Screening  03/28/2025   DTaP/Tdap/Td vaccine (3 - Td or Tdap)  02/02/2032   Hepatitis C Screening: USPSTF Recommendation to screen - Ages 16-79 yo.  Completed   Zoster (Shingles) Vaccine  Completed   HPV Vaccine  Aged [address], [place]   04/22/2023   Nurse Notes: Approximately 30 minute Non-Face -To-Face Medicare Wellness Visit

## 2023-04-24 DIAGNOSIS — M25511 Pain in right shoulder: Secondary | ICD-10-CM | POA: Diagnosis not present

## 2023-04-25 DIAGNOSIS — M5416 Radiculopathy, lumbar region: Secondary | ICD-10-CM | POA: Diagnosis not present

## 2023-04-25 DIAGNOSIS — M62830 Muscle spasm of back: Secondary | ICD-10-CM | POA: Diagnosis not present

## 2023-04-25 DIAGNOSIS — M9903 Segmental and somatic dysfunction of lumbar region: Secondary | ICD-10-CM | POA: Diagnosis not present

## 2023-04-25 DIAGNOSIS — M9901 Segmental and somatic dysfunction of cervical region: Secondary | ICD-10-CM | POA: Diagnosis not present

## 2023-05-23 ENCOUNTER — Other Ambulatory Visit: Payer: Self-pay | Admitting: *Deleted

## 2023-05-23 DIAGNOSIS — M5416 Radiculopathy, lumbar region: Secondary | ICD-10-CM | POA: Diagnosis not present

## 2023-05-23 DIAGNOSIS — M9903 Segmental and somatic dysfunction of lumbar region: Secondary | ICD-10-CM | POA: Diagnosis not present

## 2023-05-23 DIAGNOSIS — M62830 Muscle spasm of back: Secondary | ICD-10-CM | POA: Diagnosis not present

## 2023-05-23 DIAGNOSIS — C61 Malignant neoplasm of prostate: Secondary | ICD-10-CM

## 2023-05-23 DIAGNOSIS — M9901 Segmental and somatic dysfunction of cervical region: Secondary | ICD-10-CM | POA: Diagnosis not present

## 2023-05-24 ENCOUNTER — Other Ambulatory Visit: Payer: Medicare HMO

## 2023-05-24 DIAGNOSIS — C61 Malignant neoplasm of prostate: Secondary | ICD-10-CM

## 2023-05-25 LAB — PSA: Prostate Specific Ag, Serum: 3.6 ng/mL (ref 0.0–4.0)

## 2023-05-27 ENCOUNTER — Encounter: Payer: Self-pay | Admitting: Urology

## 2023-05-27 ENCOUNTER — Ambulatory Visit: Payer: Medicare HMO | Admitting: Urology

## 2023-05-27 VITALS — BP 138/70 | HR 74 | Ht 69.0 in | Wt 160.0 lb

## 2023-05-27 DIAGNOSIS — C61 Malignant neoplasm of prostate: Secondary | ICD-10-CM | POA: Diagnosis not present

## 2023-05-27 DIAGNOSIS — N401 Enlarged prostate with lower urinary tract symptoms: Secondary | ICD-10-CM | POA: Diagnosis not present

## 2023-05-27 NOTE — Progress Notes (Signed)
I, Maysun L Gibbs,acting as a scribe for Riki Altes, MD.,have documented all relevant documentation on the behalf of Riki Altes, MD,as directed by  Riki Altes, MD while in the presence of Riki Altes, MD.  05/27/2023 3:16 PM   Fawn Kirk [birth year] [id number]  Referring provider: Sherlene Shams, MD [address] [place],  [place] [address]  Chief Complaint  Patient presents with   Prostate Cancer   Urologic history: 1. cT1c adenocarcinoma prostate Biopsy June 2019; PSA 4.0; volume 52 g Left mid core with focal Gleason 3+3 at 5% Elected active surveillance pMRI 05/2020 with 63 cc volume and no suspicious lesions; PI-RADS 1 Confirmatory biopsy 06/16/2020; 64 g prostate; LB with focal ASAP; no carcinoma identified   2.  BPH with lower urinary tract symptoms On tamsulosin  HPI: Edward Tran is a 72 y.o. male here for annual follow-up.   Stable LUTS on tamsulosin 0.8 mg PSA 05/24/23 stable at 3.6 Denies dysuria, gross hematuria No flank, abdominal or pelvic pain   PMH: Past Medical History:  Diagnosis Date   Hyperlipidemia    Medical history non-contributory    Prostate cancer (HCC)    low score   Umbilical hernia without obstruction and without gangrene 01/20/2017    Surgical History: Past Surgical History:  Procedure Laterality Date   COLONOSCOPY  2016   COLONOSCOPY W/ POLYPECTOMY     COLONOSCOPY WITH PROPOFOL N/A 03/28/2020   Procedure: COLONOSCOPY WITH PROPOFOL;  Surgeon: Wyline Mood, MD;  Location: [facility] ENDOSCOPY;  Service: Gastroenterology;  Laterality: N/A;   INGUINAL HERNIA REPAIR Right 07/25/2022   Procedure: HERNIA REPAIR INGUINAL ADULT;  Surgeon: Earline Mayotte, MD;  Location: [facility] ORS;  Service: General;  Laterality: Right;   INSERTION OF MESH Right 07/25/2022   Procedure: INSERTION OF MESH;  Surgeon: Earline Mayotte, MD;  Location: [facility] ORS;  Service: General;  Laterality: Right;   SHOULDER ARTHROSCOPY WITH  ROTATOR CUFF REPAIR AND SUBACROMIAL DECOMPRESSION Right 05/01/2022   Procedure: SHOULDER ARTHROSCOPY WITH ROTATOR CUFF REPAIR AND SUBACROMIAL DECOMPRESSION, DISTAL CLAVICLE ACROMINECTOMY;  Surgeon: Juanell Fairly, MD;  Location: [facility] ORS;  Service: Orthopedics;  Laterality: Right;   TONSILLECTOMY     as a child   TONSILLECTOMY AND ADENOIDECTOMY     UMBILICAL HERNIA REPAIR N/A 11/17/2018   Procedure: HERNIA REPAIR UMBILICAL ADULT;  Surgeon: Earline Mayotte, MD;  Location: [facility] ORS;  Service: General;  Laterality: N/A;    Home Medications:  Allergies as of 05/27/2023   No Known Allergies      Medication List        Accurate as of May 27, 2023  3:16 PM. If you have any questions, ask your nurse or doctor.          atorvastatin 20 MG tablet Commonly known as: LIPITOR TAKE 1 TABLET EVERY DAY (REPLACES SIMVASTATIN)   loratadine 10 MG tablet Commonly known as: CLARITIN TAKE 1 TABLET EVERY DAY   sildenafil 20 MG tablet Commonly known as: REVATIO Take 1 tablet (20 mg total) by mouth 3 (three) times daily.   tamsulosin 0.4 MG Caps capsule Commonly known as: FLOMAX TAKE 2 CAPSULES BY MOUTH EVERY DAY  AFTER  BREAKFAST        Allergies: No Known Allergies  Family History: Family History  Problem Relation Age of Onset   Stroke Mother 32       brain aneurysm rupture    COPD Mother    Early death  Father    Stroke Maternal Grandmother 12   Cancer Maternal Grandmother    Cancer Sister        cervical ca mets to lung    COPD Sister     Social History:  reports that he has never smoked. He has never used smokeless tobacco. He reports current alcohol use of about 4.0 standard drinks of alcohol per week. He reports that he does not use drugs.   Physical Exam: BP 138/70   Pulse 74   Ht 5\' 9"  (1.753 m)   Wt 160 lb (72.6 kg)   BMI 23.63 kg/m   Constitutional:  Alert and oriented, No acute distress. HEENT: [place] AT, moist mucus membranes.  Trachea midline, no  masses. Cardiovascular: No clubbing, cyanosis, or edema. Respiratory: Normal respiratory effort, no increased work of breathing. GI: Abdomen is soft, nontender, nondistended, no abdominal masses GU: 60 grams, smooth without nodules.  Skin: No rashes, bruises or suspicious lesions. Neurologic: Grossly intact, no focal deficits, moving all 4 extremities. Psychiatric: Normal mood and affect.   Assessment & Plan:    1. T1c adenocarcinoma prostate (very low risk) Stable PSA/benign DRE Lab visit 6 months PSA  1 year follow-up with PSA  2. BPH with LUTS Stable on tamsulosin  [facility] [address] [place], [place] [address] [phone]

## 2023-06-14 ENCOUNTER — Other Ambulatory Visit: Payer: Self-pay | Admitting: Internal Medicine

## 2023-06-14 MED ORDER — SILDENAFIL CITRATE 20 MG PO TABS: 20.0000 mg | ORAL_TABLET | Freq: Three times a day (TID) | ORAL | 0 refills | Status: DC

## 2023-06-20 DIAGNOSIS — M5416 Radiculopathy, lumbar region: Secondary | ICD-10-CM | POA: Diagnosis not present

## 2023-06-20 DIAGNOSIS — M9903 Segmental and somatic dysfunction of lumbar region: Secondary | ICD-10-CM | POA: Diagnosis not present

## 2023-06-20 DIAGNOSIS — M9901 Segmental and somatic dysfunction of cervical region: Secondary | ICD-10-CM | POA: Diagnosis not present

## 2023-06-20 DIAGNOSIS — M62830 Muscle spasm of back: Secondary | ICD-10-CM | POA: Diagnosis not present

## 2023-07-11 DIAGNOSIS — L821 Other seborrheic keratosis: Secondary | ICD-10-CM | POA: Diagnosis not present

## 2023-07-11 DIAGNOSIS — L538 Other specified erythematous conditions: Secondary | ICD-10-CM | POA: Diagnosis not present

## 2023-07-11 DIAGNOSIS — L570 Actinic keratosis: Secondary | ICD-10-CM | POA: Diagnosis not present

## 2023-07-11 DIAGNOSIS — A630 Anogenital (venereal) warts: Secondary | ICD-10-CM | POA: Diagnosis not present

## 2023-07-11 DIAGNOSIS — B078 Other viral warts: Secondary | ICD-10-CM | POA: Diagnosis not present

## 2023-07-11 DIAGNOSIS — L820 Inflamed seborrheic keratosis: Secondary | ICD-10-CM | POA: Diagnosis not present

## 2023-07-11 DIAGNOSIS — D225 Melanocytic nevi of trunk: Secondary | ICD-10-CM | POA: Diagnosis not present

## 2023-07-17 ENCOUNTER — Other Ambulatory Visit: Payer: Self-pay | Admitting: Urology

## 2023-07-17 DIAGNOSIS — C61 Malignant neoplasm of prostate: Secondary | ICD-10-CM

## 2023-07-18 ENCOUNTER — Encounter (INDEPENDENT_AMBULATORY_CARE_PROVIDER_SITE_OTHER): Payer: Self-pay

## 2023-07-18 DIAGNOSIS — M62830 Muscle spasm of back: Secondary | ICD-10-CM | POA: Diagnosis not present

## 2023-07-18 DIAGNOSIS — M9903 Segmental and somatic dysfunction of lumbar region: Secondary | ICD-10-CM | POA: Diagnosis not present

## 2023-07-18 DIAGNOSIS — M5416 Radiculopathy, lumbar region: Secondary | ICD-10-CM | POA: Diagnosis not present

## 2023-07-18 DIAGNOSIS — M9901 Segmental and somatic dysfunction of cervical region: Secondary | ICD-10-CM | POA: Diagnosis not present

## 2023-07-24 ENCOUNTER — Other Ambulatory Visit: Payer: Self-pay | Admitting: Internal Medicine

## 2023-07-24 ENCOUNTER — Encounter: Payer: Self-pay | Admitting: Internal Medicine

## 2023-07-24 MED ORDER — AZITHROMYCIN 500 MG PO TABS: 500.0000 mg | ORAL_TABLET | Freq: Every day | ORAL | 0 refills | Status: DC

## 2023-07-24 MED ORDER — PREDNISONE 10 MG PO TABS: ORAL_TABLET | ORAL | 0 refills | Status: DC

## 2023-08-15 DIAGNOSIS — M62830 Muscle spasm of back: Secondary | ICD-10-CM | POA: Diagnosis not present

## 2023-08-15 DIAGNOSIS — M5416 Radiculopathy, lumbar region: Secondary | ICD-10-CM | POA: Diagnosis not present

## 2023-08-15 DIAGNOSIS — M9903 Segmental and somatic dysfunction of lumbar region: Secondary | ICD-10-CM | POA: Diagnosis not present

## 2023-08-15 DIAGNOSIS — M9901 Segmental and somatic dysfunction of cervical region: Secondary | ICD-10-CM | POA: Diagnosis not present

## 2023-08-16 ENCOUNTER — Encounter: Payer: Self-pay | Admitting: Internal Medicine

## 2023-08-16 ENCOUNTER — Ambulatory Visit (INDEPENDENT_AMBULATORY_CARE_PROVIDER_SITE_OTHER): Payer: Medicare HMO | Admitting: Internal Medicine

## 2023-08-16 VITALS — BP 118/76 | HR 68 | Temp 98.2°F | Ht 69.0 in | Wt 161.0 lb

## 2023-08-16 DIAGNOSIS — Z23 Encounter for immunization: Secondary | ICD-10-CM

## 2023-08-16 DIAGNOSIS — R030 Elevated blood-pressure reading, without diagnosis of hypertension: Secondary | ICD-10-CM

## 2023-08-16 DIAGNOSIS — C61 Malignant neoplasm of prostate: Secondary | ICD-10-CM

## 2023-08-16 DIAGNOSIS — Z8249 Family history of ischemic heart disease and other diseases of the circulatory system: Secondary | ICD-10-CM

## 2023-08-16 DIAGNOSIS — E782 Mixed hyperlipidemia: Secondary | ICD-10-CM | POA: Diagnosis not present

## 2023-08-16 DIAGNOSIS — E663 Overweight: Secondary | ICD-10-CM

## 2023-08-16 DIAGNOSIS — M25511 Pain in right shoulder: Secondary | ICD-10-CM | POA: Diagnosis not present

## 2023-08-16 DIAGNOSIS — Z0000 Encounter for general adult medical examination without abnormal findings: Secondary | ICD-10-CM

## 2023-08-16 LAB — COMPREHENSIVE METABOLIC PANEL
ALT: 17 U/L (ref 0–53)
AST: 18 U/L (ref 0–37)
Albumin: 3.9 g/dL (ref 3.5–5.2)
Alkaline Phosphatase: 70 U/L (ref 39–117)
BUN: 16 mg/dL (ref 6–23)
CO2: 29 meq/L (ref 19–32)
Calcium: 9.2 mg/dL (ref 8.4–10.5)
Chloride: 105 meq/L (ref 96–112)
Creatinine, Ser: 0.82 mg/dL (ref 0.40–1.50)
GFR: 89.2 mL/min (ref >60.00)
Glucose, Bld: 88 mg/dL (ref 70–99)
Potassium: 4.3 meq/L (ref 3.5–5.1)
Sodium: 140 meq/L (ref 135–145)
Total Bilirubin: 0.8 mg/dL (ref 0.2–1.2)
Total Protein: 6.3 g/dL (ref 6.0–8.3)

## 2023-08-16 MED ORDER — SILDENAFIL CITRATE 20 MG PO TABS: 20.0000 mg | ORAL_TABLET | Freq: Three times a day (TID) | ORAL | 3 refills | Status: DC

## 2023-08-16 NOTE — Progress Notes (Unsigned)
Subjective:  [name], male    DOB: 06-Sep-1951  Age: 72 y.o. MRN: [id number]  CC: The encounter diagnosis was Mixed hyperlipidemia.   HPI Edward Tran presents for  Chief Complaint  Patient presents with  . Medical Management of Chronic Issues   Follow up on hyperlipidemia  1) visited daughter in [place] for 2 weeks   2) left sided low back pain , intermittent aggravated by sitting .  Saw chiropractor yesterday for back issues.    Outpatient Medications Prior to Visit  Medication Sig Dispense Refill  . atorvastatin (LIPITOR) 20 MG tablet TAKE 1 TABLET EVERY DAY (REPLACES SIMVASTATIN) 90 tablet 3  . loratadine (CLARITIN) 10 MG tablet TAKE 1 TABLET EVERY DAY 90 tablet 3  . sildenafil (REVATIO) 20 MG tablet Take 1 tablet (20 mg total) by mouth 3 (three) times daily. 30 tablet 0  . tamsulosin (FLOMAX) 0.4 MG CAPS capsule TAKE 2 CAPSULES BY MOUTH EVERY DAY AFTER BREAKFAST 180 capsule 3  . azithromycin (ZITHROMAX) 500 MG tablet Take 1 tablet (500 mg total) by mouth daily. 7 tablet 0  . predniSONE (DELTASONE) 10 MG tablet 6 tablets on Day 1 , then reduce by 1 tablet daily until gone 21 tablet 0   No facility-administered medications prior to visit.    Review of Systems;  Patient denies headache, fevers, malaise, unintentional weight loss, skin rash, eye pain, sinus congestion and sinus pain, sore throat, dysphagia,  hemoptysis , cough, dyspnea, wheezing, chest pain, palpitations, orthopnea, edema, abdominal pain, nausea, melena, diarrhea, constipation, flank pain, dysuria, hematuria, urinary  Frequency, nocturia, numbness, tingling, seizures,  Focal weakness, Loss of consciousness,  Tremor, insomnia, depression, anxiety, and suicidal ideation.      Objective:  BP 118/76   Pulse 68   Temp 98.2 F (36.8 C) (Oral)   Ht 5\' 9"  (1.753 m)   Wt 161 lb (73 kg)   SpO2 96%   BMI 23.78 kg/m   BP Readings from Last 3 Encounters:  08/16/23 118/76  05/27/23 138/70   02/06/23 104/76    Wt Readings from Last 3 Encounters:  08/16/23 161 lb (73 kg)  05/27/23 160 lb (72.6 kg)  02/06/23 164 lb 6.4 oz (74.6 kg)    Physical Exam Vitals reviewed.  Constitutional:      General: He is not in acute distress.    Appearance: Normal appearance. He is normal weight. He is not ill-appearing, toxic-appearing or diaphoretic.  HENT:     Head: Normocephalic.  Eyes:     General: No scleral icterus.       Right eye: No discharge.        Left eye: No discharge.     Conjunctiva/sclera: Conjunctivae normal.  Cardiovascular:     Rate and Rhythm: Normal rate and regular rhythm.     Heart sounds: Normal heart sounds.  Pulmonary:     Effort: Pulmonary effort is normal. No respiratory distress.     Breath sounds: Normal breath sounds.  Musculoskeletal:        General: Normal range of motion.     Cervical back: Normal range of motion.  Skin:    General: Skin is warm and dry.  Neurological:     General: No focal deficit present.     Mental Status: He is alert and oriented to person, place, and time. Mental status is at baseline.  Psychiatric:        Mood and Affect: Mood normal.  Behavior: Behavior normal.        Thought Content: Thought content normal.        Judgment: Judgment normal.   Lab Results  Component Value Date   HGBA1C 5.5 02/06/2023   HGBA1C 5.5 02/05/2020    Lab Results  Component Value Date   CREATININE 0.84 02/06/2023   CREATININE 0.80 10/17/2022   CREATININE 0.83 04/09/2022    Lab Results  Component Value Date   WBC 5.0 02/06/2023   HGB 16.2 02/06/2023   HCT 47.0 02/06/2023   PLT 222.0 02/06/2023   GLUCOSE 83 02/06/2023   CHOL 153 02/06/2023   TRIG 67.0 02/06/2023   HDL 67.00 02/06/2023   LDLDIRECT 75.0 02/06/2023   LDLCALC 73 02/06/2023   ALT 22 02/06/2023   AST 21 02/06/2023   NA 138 02/06/2023   K 4.4 02/06/2023   CL 101 02/06/2023   CREATININE 0.84 02/06/2023   BUN 15 02/06/2023   CO2 27 02/06/2023   TSH 3.06  02/06/2023   PSA 3.10 03/11/2020   HGBA1C 5.5 02/06/2023   MICROALBUR <0.7 10/12/2021    No results found.  Assessment & Plan:  .Mixed hyperlipidemia     I provided 30 minutes of face-to-face time during this encounter reviewing patient's last visit with me, patient's  most recent visit with cardiology,  nephrology,  and neurology,  recent surgical and non surgical procedures, previous  labs and imaging studies, counseling on currently addressed issues,  and post visit ordering to diagnostics and therapeutics .   Follow-up: No follow-ups on file.   Sherlene Shams, MD

## 2023-08-16 NOTE — Patient Instructions (Signed)
Glad you are doing well  The pneumonia vaccine you received to day is YOUR LAST ONE.  It may cause you to flu like for a day or 2   Postpone flu vaccine until mid october

## 2023-08-16 NOTE — Assessment & Plan Note (Signed)
Diagnosed in 2019, managed with watchful waiting by [facility]. With  Most recent biopsies benign.  Repeat PSA improved and is on semi annual repeat by Urology  Lab Results  Component Value Date   PSA1 3.6 05/24/2023   PSA1 3.8 11/22/2022   PSA1 3.7 05/23/2022   PSA 3.10 03/11/2020   PSA 4.24 (H) 02/05/2020   PSA 3.2 01/30/2019

## 2023-08-17 NOTE — Assessment & Plan Note (Signed)
He is tolerating statin therapy s primary prevention due to the presence of small vessel ischemic changes noted on head CT Feb 2023. .    Lab Results  Component Value Date   CHOL 153 02/06/2023   HDL 67.00 02/06/2023   LDLCALC 73 02/06/2023   LDLDIRECT 75.0 02/06/2023   TRIG 67.0 02/06/2023   CHOLHDL 2 02/06/2023

## 2023-08-17 NOTE — Assessment & Plan Note (Signed)
I have congratulated him  in reduction of   BMI and encouraged  Continued adherence to a  low glycemic index diet and regular exercise a minimum of 5 days per week

## 2023-08-17 NOTE — Assessment & Plan Note (Addendum)
Home readings and the last several office readings have been consistently normal.  He has no  Microalbuminuria and renal function is normal.  No indication for treatment   Lab Results  Component Value Date   MICROALBUR <0.7 10/12/2021   Lab Results  Component Value Date   CREATININE 0.82 08/16/2023   Lab Results  Component Value Date   NA 140 08/16/2023   K 4.3 08/16/2023   CL 105 08/16/2023   CO2 29 08/16/2023     '

## 2023-08-17 NOTE — Progress Notes (Signed)
[name], male    DOB: 1951-01-27  Age: 72 y.o. MRN: [id number]  The patient is here for annual preventive examination and management of other chronic and acute problems.   The risk factors are reflected in the social history.  The roster of all physicians providing medical care to patient - is listed in the Snapshot section of the chart.  Activities of daily living:  The patient is 100% independent in all ADLs: dressing, toileting, feeding as well as independent mobility  Home safety : The patient has smoke detectors in the home. They wear seatbelts.  There are no firearms at home. There is no violence in the home.   There is no risks for hepatitis, STDs or HIV. There is no   history of blood transfusion. They have no travel history to infectious disease endemic areas of the world.  The patient has seen their dentist in the last six month. They have seen their eye doctor in the last year. They admit to slight hearing difficulty with regard to whispered voices and some television programs.  They have deferred audiologic testing in the last year.  They do not  have excessive sun exposure. Discussed the need for sun protection: hats, long sleeves and use of sunscreen if there is significant sun exposure.   Diet: the importance of a healthy diet is discussed. They do have a healthy diet.  The benefits of regular aerobic exercise were discussed. She walks 4 times per week ,  20 minutes.   Depression screen: there are no signs or vegative symptoms of depression- irritability, change in appetite, anhedonia, sadness/tearfullness.  Cognitive assessment: the patient manages all their financial and personal affairs and is actively engaged. They could relate day,date,year and events; recalled 2/3 objects at 3 minutes; performed clock-face test normally.  The following portions of the patient's history were reviewed and updated as appropriate: allergies, current medications, past family  history, past medical history,  past surgical history, past social history  and problem list.  Visual acuity was not assessed per patient preference since she has regular follow up with her ophthalmologist. Hearing and body mass index were assessed and reviewed.   During the course of the visit the patient was educated and counseled about appropriate screening and preventive services including : fall prevention , diabetes screening, nutrition counseling, colorectal cancer screening, and recommended immunizations.    CC: The primary encounter diagnosis was Mixed hyperlipidemia. Diagnoses of Prostate cancer (HCC), Encounter for Prevnar pneumococcal vaccination, Elevated blood pressure reading without diagnosis of hypertension, Encounter for preventive health examination, Family history of cerebral aneurysm, and Overweight were also pertinent to this visit.  History Edward Tran has a past medical history of Hyperlipidemia, Medical history non-contributory, Prostate cancer (HCC), and Umbilical hernia without obstruction and without gangrene (01/20/2017).   He has a past surgical history that includes Tonsillectomy and adenoidectomy; Colonoscopy (2016); Umbilical hernia repair (N/A, 11/17/2018); Colonoscopy with propofol (N/A, 03/28/2020); Tonsillectomy; Colonoscopy w/ polypectomy; Shoulder arthroscopy with rotator cuff repair and subacromial decompression (Right, 05/01/2022); Inguinal hernia repair (Right, 07/25/2022); and Insertion of mesh (Right, 07/25/2022).   His family history includes COPD in his mother and sister; Cancer in his maternal grandmother and sister; Early death in his father; Stroke (age of onset: 40) in his mother; Stroke (age of onset: 76) in his maternal grandmother.He reports that he has never smoked. He has never used smokeless tobacco. He reports current alcohol use of about 4.0 standard drinks of alcohol per week. He  reports that he does not use drugs.  Outpatient Medications Prior to  Visit  Medication Sig Dispense Refill  . atorvastatin (LIPITOR) 20 MG tablet TAKE 1 TABLET EVERY DAY (REPLACES SIMVASTATIN) 90 tablet 3  . loratadine (CLARITIN) 10 MG tablet TAKE 1 TABLET EVERY DAY 90 tablet 3  . tamsulosin (FLOMAX) 0.4 MG CAPS capsule TAKE 2 CAPSULES BY MOUTH EVERY DAY AFTER BREAKFAST 180 capsule 3  . azithromycin (ZITHROMAX) 500 MG tablet Take 1 tablet (500 mg total) by mouth daily. 7 tablet 0  . predniSONE (DELTASONE) 10 MG tablet 6 tablets on Day 1 , then reduce by 1 tablet daily until gone 21 tablet 0  . sildenafil (REVATIO) 20 MG tablet Take 1 tablet (20 mg total) by mouth 3 (three) times daily. 30 tablet 0   No facility-administered medications prior to visit.    Review of Systems  Patient denies headache, fevers, malaise, unintentional weight loss, skin rash, eye pain, sinus congestion and sinus pain, sore throat, dysphagia,  hemoptysis , cough, dyspnea, wheezing, chest pain, palpitations, orthopnea, edema, abdominal pain, nausea, melena, diarrhea, constipation, flank pain, dysuria, hematuria, urinary  Frequency, nocturia, numbness, tingling, seizures,  Focal weakness, Loss of consciousness,  Tremor, insomnia, depression, anxiety, and suicidal ideation.     Objective:  BP 118/76   Pulse 68   Temp 98.2 F (36.8 C) (Oral)   Ht 5\' 9"  (1.753 m)   Wt 161 lb (73 kg)   SpO2 96%   BMI 23.78 kg/m   Physical Exam Vitals reviewed.  Constitutional:      General: He is not in acute distress.    Appearance: Normal appearance. He is normal weight. He is not ill-appearing, toxic-appearing or diaphoretic.  HENT:     Head: Normocephalic and atraumatic.     Right Ear: Tympanic membrane, ear canal and external ear normal. There is no impacted cerumen.     Left Ear: Tympanic membrane, ear canal and external ear normal. There is no impacted cerumen.     Nose: Nose normal.     Mouth/Throat:     Mouth: Mucous membranes are moist.     Pharynx: Oropharynx is clear.  Eyes:      General: No scleral icterus.       Right eye: No discharge.        Left eye: No discharge.     Conjunctiva/sclera: Conjunctivae normal.  Neck:     Thyroid: No thyromegaly.     Vascular: No carotid bruit or JVD.  Cardiovascular:     Rate and Rhythm: Normal rate and regular rhythm.     Heart sounds: Normal heart sounds.  Pulmonary:     Effort: Pulmonary effort is normal. No respiratory distress.     Breath sounds: Normal breath sounds.  Abdominal:     General: Bowel sounds are normal.     Palpations: Abdomen is soft. There is no mass.     Tenderness: There is no abdominal tenderness. There is no guarding or rebound.  Musculoskeletal:        General: Normal range of motion.     Cervical back: Normal range of motion and neck supple.  Lymphadenopathy:     Cervical: No cervical adenopathy.  Skin:    General: Skin is warm and dry.  Neurological:     General: No focal deficit present.     Mental Status: He is alert and oriented to person, place, and time. Mental status is at baseline.  Psychiatric:  Mood and Affect: Mood normal.        Behavior: Behavior normal.        Thought Content: Thought content normal.        Judgment: Judgment normal.     Assessment & Plan:  Mixed hyperlipidemia Assessment & Plan: He is tolerating statin therapy s primary prevention due to the presence of small vessel ischemic changes noted on head CT Feb 2023. .    Lab Results  Component Value Date   CHOL 153 02/06/2023   HDL 67.00 02/06/2023   LDLCALC 73 02/06/2023   LDLDIRECT 75.0 02/06/2023   TRIG 67.0 02/06/2023   CHOLHDL 2 02/06/2023     Orders: -     Comprehensive metabolic panel  Prostate cancer [facility]) Assessment & Plan: Diagnosed in 2019, managed with watchful waiting by [facility]. With  Most recent biopsies benign.  Repeat PSA improved and is on semi annual repeat by Urology  Lab Results  Component Value Date   PSA1 3.6 05/24/2023   PSA1 3.8 11/22/2022   PSA1 3.7 05/23/2022    PSA 3.10 03/11/2020   PSA 4.24 (H) 02/05/2020   PSA 3.2 01/30/2019       Encounter for Prevnar pneumococcal vaccination -     Pneumococcal conjugate vaccine 20-valent  Elevated blood pressure reading without diagnosis of hypertension Assessment & Plan: Home readings and the last several office readings have been consistently normal.  He has no  Microalbuminuria and renal function is normal.  No indication for treatment   Lab Results  Component Value Date   MICROALBUR <0.7 10/12/2021   Lab Results  Component Value Date   CREATININE 0.82 08/16/2023   Lab Results  Component Value Date   NA 140 08/16/2023   K 4.3 08/16/2023   CL 105 08/16/2023   CO2 29 08/16/2023     '   Encounter for preventive health examination Assessment & Plan: age appropriate education and counseling updated, referrals for preventative services and immunizations addressed, dietary and smoking counseling addressed, most recent labs reviewed.  I have personally reviewed and have noted:   1) the patient's medical and social history 2) The pt's use of alcohol, tobacco, and illicit drugs 3) The patient's current medications and supplements 4) Functional ability including ADL's, fall risk, home safety risk, hearing and visual impairment 5) Diet and physical activities 6) Evidence for depression or mood disorder 7) The patient's height, weight, and BMI have been recorded in the chart  I have made referrals, and provided counseling and education based on review of the above    Family history of cerebral aneurysm Assessment & Plan: MRA  of brain was done in   =2014 ;  circle of willis was normal    Overweight Assessment & Plan: I have congratulated him  in reduction of   BMI and encouraged  Continued adherence to a  low glycemic index diet and regular exercise a minimum of 5 days per week   Other orders -     Sildenafil Citrate; Take 1 tablet (20 mg total) by mouth 3 (three) times daily.   Dispense: 30 tablet; Refill: 3      I provided 40 minutes of  face-to-face time during this encounter reviewing patient's current problems and past surgeries,  recent labs and imaging studies, providing counseling on the above mentioned problems , and coordination  of care .   Follow-up: Return in about 6 months (around 02/13/2024) for physical.   Sherlene Shams, MD

## 2023-08-17 NOTE — Assessment & Plan Note (Addendum)
MRA  of brain was done in   =2014 ;  circle of willis was normal

## 2023-08-17 NOTE — Assessment & Plan Note (Signed)

## 2023-08-27 DIAGNOSIS — B078 Other viral warts: Secondary | ICD-10-CM | POA: Diagnosis not present

## 2023-08-27 DIAGNOSIS — C4402 Squamous cell carcinoma of skin of lip: Secondary | ICD-10-CM | POA: Diagnosis not present

## 2023-08-27 DIAGNOSIS — D3701 Neoplasm of uncertain behavior of lip: Secondary | ICD-10-CM | POA: Diagnosis not present

## 2023-08-31 ENCOUNTER — Encounter: Payer: Self-pay | Admitting: Internal Medicine

## 2023-09-02 DIAGNOSIS — M62830 Muscle spasm of back: Secondary | ICD-10-CM | POA: Diagnosis not present

## 2023-09-02 DIAGNOSIS — M5416 Radiculopathy, lumbar region: Secondary | ICD-10-CM | POA: Diagnosis not present

## 2023-09-02 DIAGNOSIS — M9901 Segmental and somatic dysfunction of cervical region: Secondary | ICD-10-CM | POA: Diagnosis not present

## 2023-09-02 DIAGNOSIS — M9903 Segmental and somatic dysfunction of lumbar region: Secondary | ICD-10-CM | POA: Diagnosis not present

## 2023-09-03 MED ORDER — TIZANIDINE HCL 4 MG PO TABS: 4.0000 mg | ORAL_TABLET | Freq: Four times a day (QID) | ORAL | 2 refills | Status: DC | PRN

## 2023-09-12 ENCOUNTER — Other Ambulatory Visit: Payer: Self-pay | Admitting: Internal Medicine

## 2023-09-12 DIAGNOSIS — M542 Cervicalgia: Secondary | ICD-10-CM | POA: Diagnosis not present

## 2023-09-12 DIAGNOSIS — M5416 Radiculopathy, lumbar region: Secondary | ICD-10-CM | POA: Diagnosis not present

## 2023-09-12 DIAGNOSIS — M9901 Segmental and somatic dysfunction of cervical region: Secondary | ICD-10-CM | POA: Diagnosis not present

## 2023-09-12 DIAGNOSIS — M9903 Segmental and somatic dysfunction of lumbar region: Secondary | ICD-10-CM | POA: Diagnosis not present

## 2023-09-12 NOTE — Telephone Encounter (Signed)
Is it okay to refill as a 90 day supply

## 2023-10-10 DIAGNOSIS — M5416 Radiculopathy, lumbar region: Secondary | ICD-10-CM | POA: Diagnosis not present

## 2023-10-10 DIAGNOSIS — M9903 Segmental and somatic dysfunction of lumbar region: Secondary | ICD-10-CM | POA: Diagnosis not present

## 2023-10-10 DIAGNOSIS — M542 Cervicalgia: Secondary | ICD-10-CM | POA: Diagnosis not present

## 2023-10-10 DIAGNOSIS — M9901 Segmental and somatic dysfunction of cervical region: Secondary | ICD-10-CM | POA: Diagnosis not present

## 2023-10-26 ENCOUNTER — Other Ambulatory Visit: Payer: Self-pay | Admitting: Family

## 2023-10-26 DIAGNOSIS — J302 Other seasonal allergic rhinitis: Secondary | ICD-10-CM

## 2023-10-29 ENCOUNTER — Encounter: Payer: Self-pay | Admitting: Internal Medicine

## 2023-10-29 DIAGNOSIS — J302 Other seasonal allergic rhinitis: Secondary | ICD-10-CM

## 2023-10-30 MED ORDER — SILDENAFIL CITRATE 20 MG PO TABS: 20.0000 mg | ORAL_TABLET | Freq: Three times a day (TID) | ORAL | 3 refills | Status: DC

## 2023-10-30 MED ORDER — LORATADINE 10 MG PO TABS
ORAL_TABLET | ORAL | 3 refills | Status: DC
  Filled 2024-02-10 (×2): qty 90, 90d supply, fill #0
  Filled 2024-05-08: qty 90, 90d supply, fill #1
  Filled 2024-08-05: qty 90, 90d supply, fill #2

## 2023-11-01 ENCOUNTER — Telehealth: Payer: Self-pay

## 2023-11-01 NOTE — Telephone Encounter (Signed)
Pharmacy Patient Advocate Encounter  We have received a fax stating the following:  Received notification from [facility] that Prior Authorization for loratadine 10mg   has been DENIED.  Full denial letter will be uploaded to the media tab. See denial reason below.

## 2023-11-04 ENCOUNTER — Telehealth: Payer: Self-pay | Admitting: Pharmacist

## 2023-11-04 NOTE — Telephone Encounter (Signed)
noted 

## 2023-11-04 NOTE — Telephone Encounter (Signed)
Pharmacy Patient Advocate Encounter  Received notification from [facility] that Prior Authorization for Sildenafil 20mg  tablet has been DENIED.  Full denial letter will be uploaded to the media tab. See denial reason below.   PA #/Case ID/Reference #: [id number]

## 2023-11-07 DIAGNOSIS — M9901 Segmental and somatic dysfunction of cervical region: Secondary | ICD-10-CM | POA: Diagnosis not present

## 2023-11-07 DIAGNOSIS — M5416 Radiculopathy, lumbar region: Secondary | ICD-10-CM | POA: Diagnosis not present

## 2023-11-07 DIAGNOSIS — M542 Cervicalgia: Secondary | ICD-10-CM | POA: Diagnosis not present

## 2023-11-07 DIAGNOSIS — M9903 Segmental and somatic dysfunction of lumbar region: Secondary | ICD-10-CM | POA: Diagnosis not present

## 2023-11-07 MED ORDER — SILDENAFIL CITRATE 20 MG PO TABS: 20.0000 mg | ORAL_TABLET | Freq: Three times a day (TID) | ORAL | 3 refills | Status: DC

## 2023-11-07 NOTE — Telephone Encounter (Signed)
New rx sent for #90/month.  Please confirm with mail order pharmacy so they wil fill it .  thanks

## 2023-11-07 NOTE — Addendum Note (Signed)
Addended by: Sherlene Shams on: 11/07/2023 05:44 PM   Modules accepted: Orders

## 2023-11-09 ENCOUNTER — Encounter: Payer: Self-pay | Admitting: Internal Medicine

## 2023-11-11 MED ORDER — SILDENAFIL CITRATE 20 MG PO TABS: 20.0000 mg | ORAL_TABLET | Freq: Three times a day (TID) | ORAL | 3 refills | Status: DC

## 2023-11-11 NOTE — Telephone Encounter (Signed)
Please send the printed rx for sildenafil to [facility]

## 2023-11-12 MED ORDER — SILDENAFIL CITRATE 20 MG PO TABS: 20.0000 mg | ORAL_TABLET | Freq: Three times a day (TID) | ORAL | 3 refills | Status: DC

## 2023-11-14 DIAGNOSIS — C4402 Squamous cell carcinoma of skin of lip: Secondary | ICD-10-CM | POA: Diagnosis not present

## 2023-11-14 DIAGNOSIS — L814 Other melanin hyperpigmentation: Secondary | ICD-10-CM | POA: Diagnosis not present

## 2023-11-14 DIAGNOSIS — L578 Other skin changes due to chronic exposure to nonionizing radiation: Secondary | ICD-10-CM | POA: Diagnosis not present

## 2023-11-14 DIAGNOSIS — L988 Other specified disorders of the skin and subcutaneous tissue: Secondary | ICD-10-CM | POA: Diagnosis not present

## 2023-11-26 ENCOUNTER — Other Ambulatory Visit: Payer: Medicare HMO

## 2023-11-28 ENCOUNTER — Other Ambulatory Visit: Payer: Medicare HMO

## 2023-11-28 ENCOUNTER — Other Ambulatory Visit: Payer: Self-pay

## 2023-11-28 DIAGNOSIS — C61 Malignant neoplasm of prostate: Secondary | ICD-10-CM | POA: Diagnosis not present

## 2023-11-28 DIAGNOSIS — N401 Enlarged prostate with lower urinary tract symptoms: Secondary | ICD-10-CM

## 2023-11-29 LAB — PSA: Prostate Specific Ag, Serum: 4.2 ng/mL — ABNORMAL HIGH (ref 0.0–4.0)

## 2023-12-05 DIAGNOSIS — M5416 Radiculopathy, lumbar region: Secondary | ICD-10-CM | POA: Diagnosis not present

## 2023-12-05 DIAGNOSIS — M9903 Segmental and somatic dysfunction of lumbar region: Secondary | ICD-10-CM | POA: Diagnosis not present

## 2023-12-05 DIAGNOSIS — M542 Cervicalgia: Secondary | ICD-10-CM | POA: Diagnosis not present

## 2023-12-05 DIAGNOSIS — M9901 Segmental and somatic dysfunction of cervical region: Secondary | ICD-10-CM | POA: Diagnosis not present

## 2024-01-02 DIAGNOSIS — M5416 Radiculopathy, lumbar region: Secondary | ICD-10-CM | POA: Diagnosis not present

## 2024-01-02 DIAGNOSIS — M9901 Segmental and somatic dysfunction of cervical region: Secondary | ICD-10-CM | POA: Diagnosis not present

## 2024-01-02 DIAGNOSIS — M542 Cervicalgia: Secondary | ICD-10-CM | POA: Diagnosis not present

## 2024-01-02 DIAGNOSIS — M9903 Segmental and somatic dysfunction of lumbar region: Secondary | ICD-10-CM | POA: Diagnosis not present

## 2024-01-14 DIAGNOSIS — H269 Unspecified cataract: Secondary | ICD-10-CM | POA: Diagnosis not present

## 2024-01-14 DIAGNOSIS — H5203 Hypermetropia, bilateral: Secondary | ICD-10-CM | POA: Diagnosis not present

## 2024-01-14 DIAGNOSIS — H52223 Regular astigmatism, bilateral: Secondary | ICD-10-CM | POA: Diagnosis not present

## 2024-01-14 DIAGNOSIS — H524 Presbyopia: Secondary | ICD-10-CM | POA: Diagnosis not present

## 2024-01-16 DIAGNOSIS — M25511 Pain in right shoulder: Secondary | ICD-10-CM | POA: Diagnosis not present

## 2024-01-16 DIAGNOSIS — M24811 Other specific joint derangements of right shoulder, not elsewhere classified: Secondary | ICD-10-CM | POA: Diagnosis not present

## 2024-01-27 DIAGNOSIS — J309 Allergic rhinitis, unspecified: Secondary | ICD-10-CM | POA: Diagnosis not present

## 2024-01-27 DIAGNOSIS — N529 Male erectile dysfunction, unspecified: Secondary | ICD-10-CM | POA: Diagnosis not present

## 2024-01-27 DIAGNOSIS — C61 Malignant neoplasm of prostate: Secondary | ICD-10-CM | POA: Diagnosis not present

## 2024-01-27 DIAGNOSIS — E785 Hyperlipidemia, unspecified: Secondary | ICD-10-CM | POA: Diagnosis not present

## 2024-01-27 DIAGNOSIS — N400 Enlarged prostate without lower urinary tract symptoms: Secondary | ICD-10-CM | POA: Diagnosis not present

## 2024-01-30 DIAGNOSIS — M5416 Radiculopathy, lumbar region: Secondary | ICD-10-CM | POA: Diagnosis not present

## 2024-01-30 DIAGNOSIS — M9901 Segmental and somatic dysfunction of cervical region: Secondary | ICD-10-CM | POA: Diagnosis not present

## 2024-01-30 DIAGNOSIS — M542 Cervicalgia: Secondary | ICD-10-CM | POA: Diagnosis not present

## 2024-01-30 DIAGNOSIS — M9903 Segmental and somatic dysfunction of lumbar region: Secondary | ICD-10-CM | POA: Diagnosis not present

## 2024-02-10 ENCOUNTER — Other Ambulatory Visit (HOSPITAL_COMMUNITY): Payer: Self-pay

## 2024-02-10 MED ORDER — SILDENAFIL CITRATE 20 MG PO TABS
20.0000 mg | ORAL_TABLET | Freq: Three times a day (TID) | ORAL | 3 refills | Status: DC
  Filled 2024-02-10 – 2024-02-12 (×2): qty 90, 30d supply, fill #0

## 2024-02-10 MED FILL — Tamsulosin HCl Cap 0.4 MG: ORAL | 90 days supply | Qty: 180 | Fill #0 | Status: CN

## 2024-02-10 MED FILL — Tamsulosin HCl Cap 0.4 MG: ORAL | 90 days supply | Qty: 180 | Fill #0 | Status: AC

## 2024-02-12 ENCOUNTER — Other Ambulatory Visit (HOSPITAL_COMMUNITY): Payer: Self-pay

## 2024-02-12 DIAGNOSIS — D2262 Melanocytic nevi of left upper limb, including shoulder: Secondary | ICD-10-CM | POA: Diagnosis not present

## 2024-02-12 DIAGNOSIS — D225 Melanocytic nevi of trunk: Secondary | ICD-10-CM | POA: Diagnosis not present

## 2024-02-12 DIAGNOSIS — D2271 Melanocytic nevi of right lower limb, including hip: Secondary | ICD-10-CM | POA: Diagnosis not present

## 2024-02-12 DIAGNOSIS — D2261 Melanocytic nevi of right upper limb, including shoulder: Secondary | ICD-10-CM | POA: Diagnosis not present

## 2024-02-12 DIAGNOSIS — L570 Actinic keratosis: Secondary | ICD-10-CM | POA: Diagnosis not present

## 2024-02-12 DIAGNOSIS — B078 Other viral warts: Secondary | ICD-10-CM | POA: Diagnosis not present

## 2024-02-12 DIAGNOSIS — L821 Other seborrheic keratosis: Secondary | ICD-10-CM | POA: Diagnosis not present

## 2024-02-12 DIAGNOSIS — D2272 Melanocytic nevi of left lower limb, including hip: Secondary | ICD-10-CM | POA: Diagnosis not present

## 2024-02-13 ENCOUNTER — Other Ambulatory Visit: Payer: Self-pay | Admitting: Internal Medicine

## 2024-02-13 ENCOUNTER — Other Ambulatory Visit: Payer: Self-pay

## 2024-02-13 ENCOUNTER — Other Ambulatory Visit (HOSPITAL_COMMUNITY): Payer: Self-pay

## 2024-02-13 MED ORDER — ATORVASTATIN CALCIUM 20 MG PO TABS
20.0000 mg | ORAL_TABLET | Freq: Every day | ORAL | 3 refills | Status: AC
  Filled 2024-02-13: qty 90, 90d supply, fill #0
  Filled 2024-05-08: qty 90, 90d supply, fill #1
  Filled 2024-08-05: qty 90, 90d supply, fill #2
  Filled 2024-11-22: qty 90, 90d supply, fill #3

## 2024-02-14 ENCOUNTER — Encounter: Payer: Self-pay | Admitting: Internal Medicine

## 2024-02-14 ENCOUNTER — Ambulatory Visit (INDEPENDENT_AMBULATORY_CARE_PROVIDER_SITE_OTHER): Payer: Medicare HMO | Admitting: Internal Medicine

## 2024-02-14 ENCOUNTER — Other Ambulatory Visit (HOSPITAL_COMMUNITY): Payer: Self-pay

## 2024-02-14 VITALS — BP 120/74 | HR 70 | Ht 69.0 in | Wt 163.8 lb

## 2024-02-14 DIAGNOSIS — C61 Malignant neoplasm of prostate: Secondary | ICD-10-CM | POA: Diagnosis not present

## 2024-02-14 DIAGNOSIS — Z0000 Encounter for general adult medical examination without abnormal findings: Secondary | ICD-10-CM | POA: Diagnosis not present

## 2024-02-14 DIAGNOSIS — R030 Elevated blood-pressure reading, without diagnosis of hypertension: Secondary | ICD-10-CM

## 2024-02-14 DIAGNOSIS — E782 Mixed hyperlipidemia: Secondary | ICD-10-CM

## 2024-02-14 DIAGNOSIS — R5383 Other fatigue: Secondary | ICD-10-CM | POA: Diagnosis not present

## 2024-02-14 DIAGNOSIS — R7301 Impaired fasting glucose: Secondary | ICD-10-CM

## 2024-02-14 DIAGNOSIS — Z8546 Personal history of malignant neoplasm of prostate: Secondary | ICD-10-CM

## 2024-02-14 LAB — CBC WITH DIFFERENTIAL/PLATELET
Basophils Absolute: 0 10*3/uL (ref 0.0–0.1)
Basophils Relative: 0.3 % (ref 0.0–3.0)
Eosinophils Absolute: 0 10*3/uL (ref 0.0–0.7)
Eosinophils Relative: 0.6 % (ref 0.0–5.0)
HCT: 47.7 % (ref 39.0–52.0)
Hemoglobin: 16.1 g/dL (ref 13.0–17.0)
Lymphocytes Relative: 12.8 % (ref 12.0–46.0)
Lymphs Abs: 0.8 10*3/uL (ref 0.7–4.0)
MCHC: 33.8 g/dL (ref 30.0–36.0)
MCV: 95 fl (ref 78.0–100.0)
Monocytes Absolute: 0.9 10*3/uL (ref 0.1–1.0)
Monocytes Relative: 14.9 % — ABNORMAL HIGH (ref 3.0–12.0)
Neutro Abs: 4.5 10*3/uL (ref 1.4–7.7)
Neutrophils Relative %: 71.4 % (ref 43.0–77.0)
Platelets: 205 10*3/uL (ref 150.0–400.0)
RBC: 5.02 Mil/uL (ref 4.22–5.81)
RDW: 13.2 % (ref 11.5–15.5)
WBC: 6.3 10*3/uL (ref 4.0–10.5)

## 2024-02-14 LAB — LIPID PANEL
Cholesterol: 173 mg/dL (ref 0–200)
HDL: 71.9 mg/dL (ref >39.00)
LDL Cholesterol: 91 mg/dL (ref 0–99)
NonHDL: 100.66
Total CHOL/HDL Ratio: 2
Triglycerides: 48 mg/dL (ref 0.0–149.0)
VLDL: 9.6 mg/dL (ref 0.0–40.0)

## 2024-02-14 LAB — COMPREHENSIVE METABOLIC PANEL
ALT: 21 U/L (ref 0–53)
AST: 24 U/L (ref 0–37)
Albumin: 4.6 g/dL (ref 3.5–5.2)
Alkaline Phosphatase: 65 U/L (ref 39–117)
BUN: 15 mg/dL (ref 6–23)
CO2: 30 meq/L (ref 19–32)
Calcium: 9.8 mg/dL (ref 8.4–10.5)
Chloride: 103 meq/L (ref 96–112)
Creatinine, Ser: 0.81 mg/dL (ref 0.40–1.50)
GFR: 89.22 mL/min (ref >60.00)
Glucose, Bld: 99 mg/dL (ref 70–99)
Potassium: 4.2 meq/L (ref 3.5–5.1)
Sodium: 140 meq/L (ref 135–145)
Total Bilirubin: 0.7 mg/dL (ref 0.2–1.2)
Total Protein: 7.1 g/dL (ref 6.0–8.3)

## 2024-02-14 LAB — MICROALBUMIN / CREATININE URINE RATIO
Creatinine,U: 49.4 mg/dL
Microalb Creat Ratio: UNDETERMINED mg/g (ref 0.0–30.0)
Microalb, Ur: <0.7 mg/dL

## 2024-02-14 LAB — LDL CHOLESTEROL, DIRECT: Direct LDL: 89 mg/dL

## 2024-02-14 LAB — TSH: TSH: 2.2 u[IU]/mL (ref 0.35–5.50)

## 2024-02-14 NOTE — Assessment & Plan Note (Signed)
 Diagnosed in 2019, managed with watchful waiting by [facility]. With  Most recent biopsies benign.  Repeat PSA increaesd ; continue  semi annual repeat by Urology  Lab Results  Component Value Date   PSA1 4.2 (H) 11/28/2023   PSA1 3.6 05/24/2023   PSA1 3.8 11/22/2022   PSA 3.10 03/11/2020   PSA 4.24 (H) 02/05/2020   PSA 3.2 01/30/2019

## 2024-02-14 NOTE — Assessment & Plan Note (Signed)
 He was referred to URology for elevated PSA Dec 2024.

## 2024-02-14 NOTE — Assessment & Plan Note (Signed)
 Home readings and the last several office readings have been consistently normal.  He has no  Microalbuminuria ;  corrected UaCr from 2022 is 13;   renal function is normal.  No indication for treatment   Lab Results  Component Value Date   MICROALBUR <0.7 10/12/2021   Lab Results  Component Value Date   CREATININE 0.82 08/16/2023   Lab Results  Component Value Date   NA 140 08/16/2023   K 4.3 08/16/2023   CL 105 08/16/2023   CO2 29 08/16/2023     '

## 2024-02-14 NOTE — Progress Notes (Signed)
 [name], male    DOB: Mar 28, 1951  Age: 73 y.o. MRN: [id number]  The patient is here for annual preventive examination and management of other chronic and acute problems.   The risk factors are reflected in the social history.   The roster of all physicians providing medical care to patient - is listed in the Snapshot section of the chart.   Activities of daily living:  The patient is 100% independent in all ADLs: dressing, toileting, feeding as well as independent mobility   Home safety : The patient has smoke detectors in the home. They wear seatbelts.  There are no unsecured firearms at home. There is no violence in the home.    There is no risks for hepatitis, STDs or HIV. There is no   history of blood transfusion. They have no travel history to infectious disease endemic areas of the world.   The patient has seen their dentist in the last six month. They have seen their eye doctor in the last year. The patinet  denies slight hearing difficulty with regard to whispered voices and some television programs.  They have deferred audiologic testing in the last year.  They do not  have excessive sun exposure. Discussed the need for sun protection: hats, long sleeves and use of sunscreen if there is significant sun exposure.    Diet: the importance of a healthy diet is discussed. They do have a healthy diet.   The benefits of regular aerobic exercise were discussed. The patient  exercises  3 to 5 days per week  for  60 minutes.    Depression screen: there are no signs or vegative symptoms of depression- irritability, change in appetite, anhedonia, sadness/tearfullness.   The following portions of the patient's history were reviewed and updated as appropriate: allergies, current medications, past family history, past medical history,  past surgical history, past social history  and problem list.   Visual acuity was not assessed per patient preference since the patient has regular  follow up with an  ophthalmologist. Hearing and body mass index were assessed and reviewed.    During the course of the visit the patient was educated and counseled about appropriate screening and preventive services including : fall prevention , diabetes screening, nutrition counseling, colorectal cancer screening, and recommended immunizations.    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  Avoiding direct sunlight.     Review of Symptoms  Patient denies headache, fevers, malaise, unintentional weight loss, skin rash, eye pain, sinus congestion and sinus pain, sore throat, dysphagia,  hemoptysis , cough, dyspnea, wheezing, chest pain, palpitations, orthopnea, edema, abdominal pain, nausea, melena, diarrhea, constipation, flank pain, dysuria, hematuria, urinary  Frequency, nocturia, numbness, tingling, seizures,  Focal weakness, Loss of consciousness,  Tremor, insomnia, depression, anxiety, and suicidal ideation.    Physical Exam:  BP 120/74   Pulse 70   Ht 5\' 9"  (1.753 m)   Wt 163 lb 12.8 oz (74.3 kg)   SpO2 98%   BMI 24.19 kg/m    Physical Exam Vitals reviewed.  Constitutional:      General: He is not in acute distress.    Appearance: Normal appearance. He is normal weight. He is not ill-appearing, toxic-appearing or diaphoretic.  HENT:     Head: Normocephalic and atraumatic.     Right Ear: Tympanic membrane, ear canal and external ear normal. There  is no impacted cerumen.     Left Ear: Tympanic membrane, ear canal and external ear normal. There is no impacted cerumen.     Nose: Nose normal.     Mouth/Throat:     Mouth: Mucous membranes are moist.     Pharynx: Oropharynx is clear.  Eyes:     General: No scleral icterus.       Right eye: No discharge.        Left eye: No discharge.     Conjunctiva/sclera: Conjunctivae normal.  Neck:     Thyroid: No thyromegaly.     Vascular: No carotid bruit or JVD.   Cardiovascular:     Rate and Rhythm: Normal rate and regular rhythm.     Heart sounds: Normal heart sounds.  Pulmonary:     Effort: Pulmonary effort is normal. No respiratory distress.     Breath sounds: Normal breath sounds.  Abdominal:     General: Bowel sounds are normal.     Palpations: Abdomen is soft. There is no mass.     Tenderness: There is no abdominal tenderness. There is no guarding or rebound.  Musculoskeletal:        General: Normal range of motion.     Cervical back: Normal range of motion and neck supple.  Lymphadenopathy:     Cervical: No cervical adenopathy.  Skin:    General: Skin is warm and dry.  Neurological:     General: No focal deficit present.     Mental Status: He is alert and oriented to person, place, and time. Mental status is at baseline.  Psychiatric:        Mood and Affect: Mood normal.        Behavior: Behavior normal.        Thought Content: Thought content normal.        Judgment: Judgment normal.    Assessment and Plan: Encounter for preventive health examination Assessment & Plan: age appropriate education and counseling updated, referrals for preventative services and immunizations addressed, dietary and smoking counseling addressed, most recent labs reviewed.  I have personally reviewed and have noted:   1) the patient's medical and social history 2) The pt's use of alcohol, tobacco, and illicit drugs 3) The patient's current medications and supplements 4) Functional ability including ADL's, fall risk, home safety risk, hearing and visual impairment 5) Diet and physical activities 6) Evidence for depression or mood disorder 7) The patient's height, weight, and BMI have been recorded in the chart   I have made referrals, and provided counseling and education based on review of the above    Mixed hyperlipidemia -     Comprehensive metabolic panel -     Lipid panel -     LDL cholesterol, direct  Impaired fasting glucose  Other  fatigue -     TSH -     CBC with Differential/Platelet  History of prostate cancer Assessment & Plan: He was referred to URology for elevated PSA Dec 2024.     Prostate cancer [facility]) Assessment & Plan: Diagnosed in 2019, managed with watchful waiting by [facility]. With  Most recent biopsies benign.  Repeat PSA increaesd ; continue  semi annual repeat by Urology  Lab Results  Component Value Date   PSA1 4.2 (H) 11/28/2023   PSA1 3.6 05/24/2023   PSA1 3.8 11/22/2022   PSA 3.10 03/11/2020   PSA 4.24 (H) 02/05/2020   PSA 3.2 01/30/2019       Elevated blood pressure  reading without diagnosis of hypertension Assessment & Plan: Home readings and the last several office readings have been consistently normal.  He has no  Microalbuminuria ;  corrected UaCr from 2022 is 13;   renal function is normal.  No indication for treatment   Lab Results  Component Value Date   MICROALBUR <0.7 10/12/2021   Lab Results  Component Value Date   CREATININE 0.82 08/16/2023   Lab Results  Component Value Date   NA 140 08/16/2023   K 4.3 08/16/2023   CL 105 08/16/2023   CO2 29 08/16/2023     '  Orders: -     Microalbumin / creatinine urine ratio    Return in about 6 months (around 08/16/2024).  Sherlene Shams, MD

## 2024-02-14 NOTE — Patient Instructions (Addendum)
 By now you have probably received the message from [facility] about a miscalculation on the test that measures the albumin to creatinine ratio which helps [place] determine if early kidney disease is present.   I have reviewed your 2022  screening test for nephropathy and recalculated your ratio and it was NORMAL.  (This is the one that was abnormal for Edward Tran)   We will repeat it today  since it has been 3 years   Tell Edward Tran to stop the meloxicam and any other OTC NSAIDS( advil , aleve).  Topical voltaren gel ok and tylenol OK

## 2024-02-15 ENCOUNTER — Encounter: Payer: Self-pay | Admitting: Internal Medicine

## 2024-02-15 NOTE — Assessment & Plan Note (Signed)

## 2024-02-18 ENCOUNTER — Other Ambulatory Visit (HOSPITAL_COMMUNITY): Payer: Self-pay

## 2024-02-18 ENCOUNTER — Telehealth: Payer: Self-pay

## 2024-02-18 NOTE — Telephone Encounter (Signed)
 A user error has taken place: encounter opened in error, closed for administrative reasons.

## 2024-02-21 ENCOUNTER — Other Ambulatory Visit (HOSPITAL_COMMUNITY): Payer: Self-pay

## 2024-02-27 DIAGNOSIS — M9903 Segmental and somatic dysfunction of lumbar region: Secondary | ICD-10-CM | POA: Diagnosis not present

## 2024-02-27 DIAGNOSIS — M9901 Segmental and somatic dysfunction of cervical region: Secondary | ICD-10-CM | POA: Diagnosis not present

## 2024-02-27 DIAGNOSIS — M5416 Radiculopathy, lumbar region: Secondary | ICD-10-CM | POA: Diagnosis not present

## 2024-02-27 DIAGNOSIS — M542 Cervicalgia: Secondary | ICD-10-CM | POA: Diagnosis not present

## 2024-03-26 DIAGNOSIS — M5416 Radiculopathy, lumbar region: Secondary | ICD-10-CM | POA: Diagnosis not present

## 2024-03-26 DIAGNOSIS — M9903 Segmental and somatic dysfunction of lumbar region: Secondary | ICD-10-CM | POA: Diagnosis not present

## 2024-03-26 DIAGNOSIS — M542 Cervicalgia: Secondary | ICD-10-CM | POA: Diagnosis not present

## 2024-03-26 DIAGNOSIS — M9901 Segmental and somatic dysfunction of cervical region: Secondary | ICD-10-CM | POA: Diagnosis not present

## 2024-04-21 ENCOUNTER — Encounter: Payer: Medicare HMO | Admitting: Internal Medicine

## 2024-04-23 DIAGNOSIS — M9903 Segmental and somatic dysfunction of lumbar region: Secondary | ICD-10-CM | POA: Diagnosis not present

## 2024-04-23 DIAGNOSIS — M9901 Segmental and somatic dysfunction of cervical region: Secondary | ICD-10-CM | POA: Diagnosis not present

## 2024-04-23 DIAGNOSIS — M542 Cervicalgia: Secondary | ICD-10-CM | POA: Diagnosis not present

## 2024-04-23 DIAGNOSIS — M5416 Radiculopathy, lumbar region: Secondary | ICD-10-CM | POA: Diagnosis not present

## 2024-05-08 ENCOUNTER — Other Ambulatory Visit (HOSPITAL_COMMUNITY): Payer: Self-pay

## 2024-05-08 MED FILL — Tamsulosin HCl Cap 0.4 MG: ORAL | 90 days supply | Qty: 180 | Fill #1 | Status: AC

## 2024-05-21 ENCOUNTER — Encounter: Payer: Self-pay | Admitting: Urology

## 2024-05-25 ENCOUNTER — Other Ambulatory Visit: Payer: Self-pay

## 2024-05-25 DIAGNOSIS — C61 Malignant neoplasm of prostate: Secondary | ICD-10-CM

## 2024-05-25 DIAGNOSIS — N401 Enlarged prostate with lower urinary tract symptoms: Secondary | ICD-10-CM

## 2024-05-26 ENCOUNTER — Other Ambulatory Visit: Payer: Self-pay

## 2024-05-26 DIAGNOSIS — C61 Malignant neoplasm of prostate: Secondary | ICD-10-CM

## 2024-05-26 DIAGNOSIS — N401 Enlarged prostate with lower urinary tract symptoms: Secondary | ICD-10-CM

## 2024-05-27 LAB — PSA: Prostate Specific Ag, Serum: 3.6 ng/mL (ref 0.0–4.0)

## 2024-05-28 ENCOUNTER — Encounter: Payer: Self-pay | Admitting: Urology

## 2024-05-28 ENCOUNTER — Ambulatory Visit: Payer: Self-pay | Admitting: Urology

## 2024-05-28 ENCOUNTER — Encounter: Payer: Self-pay | Admitting: Internal Medicine

## 2024-05-28 VITALS — BP 158/84 | HR 71 | Ht 69.0 in | Wt 155.0 lb

## 2024-05-28 DIAGNOSIS — C61 Malignant neoplasm of prostate: Secondary | ICD-10-CM

## 2024-05-28 DIAGNOSIS — M9901 Segmental and somatic dysfunction of cervical region: Secondary | ICD-10-CM | POA: Diagnosis not present

## 2024-05-28 DIAGNOSIS — N401 Enlarged prostate with lower urinary tract symptoms: Secondary | ICD-10-CM

## 2024-05-28 DIAGNOSIS — M5416 Radiculopathy, lumbar region: Secondary | ICD-10-CM | POA: Diagnosis not present

## 2024-05-28 DIAGNOSIS — M9903 Segmental and somatic dysfunction of lumbar region: Secondary | ICD-10-CM | POA: Diagnosis not present

## 2024-05-28 DIAGNOSIS — M542 Cervicalgia: Secondary | ICD-10-CM | POA: Diagnosis not present

## 2024-05-28 NOTE — Progress Notes (Signed)
 05/28/2024 2:12 PM   KHAIRI GARMAN [birth year] [id number]  Referring provider: Marylynn Verneita CROME, MD [address] [place],  [place] [address]  Chief Complaint  Patient presents with   Follow-up   Urologic history: 1. cT1c adenocarcinoma prostate Biopsy June 2019; PSA 4.0; volume 52 g Left mid core with focal Gleason 3+3 at 5% Elected active surveillance pMRI 05/2020 with 63 cc volume and no suspicious lesions; PI-RADS 1 Confirmatory biopsy 06/16/2020; 64 g prostate; LB with focal ASAP; no carcinoma identified   2.  BPH with lower urinary tract symptoms On tamsulosin   HPI: Edward Tran is a 73 y.o. male here for annual follow-up.   Remains on tamsulosin  0.8 mg; some increased hesitancy, frequency and urgency 6 month interim PSA 11/2023 stable at 4.2; PSA 05/22/2024 was 3.6 Denies dysuria, gross hematuria No flank, abdominal or pelvic pain   PMH: Past Medical History:  Diagnosis Date   Cataract 2016   told surgery needed in probably 5+ years. 2025 exam told I'm good for at least another year without needing surgery   Hyperlipidemia    Medical history non-contributory    Prostate cancer (HCC)    low score   Umbilical hernia without obstruction and without gangrene 01/20/2017    Surgical History: Past Surgical History:  Procedure Laterality Date   COLONOSCOPY  2016   COLONOSCOPY W/ POLYPECTOMY     COLONOSCOPY WITH PROPOFOL  N/A 03/28/2020   Procedure: COLONOSCOPY WITH PROPOFOL ;  Surgeon: Therisa Bi, MD;  Location: [facility];  Service: Gastroenterology;  Laterality: N/A;   INGUINAL HERNIA REPAIR Right 07/25/2022   Procedure: HERNIA REPAIR INGUINAL ADULT;  Surgeon: Dessa Reyes ORN, MD;  Location: [facility] ORS;  Service: General;  Laterality: Right;   INSERTION OF MESH Right 07/25/2022   Procedure: INSERTION OF MESH;  Surgeon: Dessa Reyes ORN, MD;  Location: [facility] ORS;  Service: General;  Laterality: Right;   SHOULDER ARTHROSCOPY WITH ROTATOR CUFF  REPAIR AND SUBACROMIAL DECOMPRESSION Right 05/01/2022   Procedure: SHOULDER ARTHROSCOPY WITH ROTATOR CUFF REPAIR AND SUBACROMIAL DECOMPRESSION, DISTAL CLAVICLE ACROMINECTOMY;  Surgeon: Marchia Drivers, MD;  Location: [facility] ORS;  Service: Orthopedics;  Laterality: Right;   TONSILLECTOMY     as a child   TONSILLECTOMY AND ADENOIDECTOMY     UMBILICAL HERNIA REPAIR N/A 11/17/2018   Procedure: HERNIA REPAIR UMBILICAL ADULT;  Surgeon: Dessa Reyes ORN, MD;  Location: [facility] ORS;  Service: General;  Laterality: N/A;    Home Medications:  Allergies as of 05/28/2024   No Known Allergies      Medication List        Accurate as of May 28, 2024  2:12 PM. If you have any questions, ask your nurse or doctor.          atorvastatin  20 MG tablet Commonly known as: LIPITOR Take 1 tablet (20 mg total) by mouth daily.(Replaces simvastatin )   fluorouracil 5 % cream Commonly known as: EFUDEX Apply 1 Application topically 2 (two) times daily.   loratadine  10 MG tablet Commonly known as: CLARITIN  TAKE 1 TABLET EVERY DAY   sildenafil  20 MG tablet Commonly known as: REVATIO  Take 1 tablet (20 mg total) by mouth 3 (three) times daily.   tamsulosin  0.4 MG Caps capsule Commonly known as: FLOMAX  TAKE 2 CAPSULES BY MOUTH EVERY DAY AFTER BREAKFAST        Allergies: No Known Allergies  Family History: Family History  Problem Relation Age of Onset   Stroke Mother 87  brain aneurysm rupture    COPD Mother    Early death Father    Stroke Maternal Grandmother 19   Cancer Maternal Grandmother    Cancer Sister        cervical ca mets to lung    COPD Sister    Cancer Sister     Social History:  reports that he has never smoked. He has never used smokeless tobacco. He reports current alcohol use of about 3.0 standard drinks of alcohol per week. He reports that he does not use drugs.   Physical Exam: BP (!) 158/84   Pulse 71   Ht 5' 9 (1.753 m)   Wt 155 lb (70.3 kg)   BMI 22.89  kg/m   Constitutional:  Alert and oriented, No acute distress. HEENT: [place] AT Respiratory: Normal respiratory effort, no increased work of breathing. GU: 60 grams, smooth without nodules.  Psychiatric: Normal mood and affect.   Assessment & Plan:    1. T1c adenocarcinoma prostate (very low risk) Stable PSA/benign DRE 6 months PSA with Dr. Marylynn 1 year follow-up with PSA  2. BPH with LUTS Slight worsening of symptoms He is not interested in pursuing surgical management We discussed adding finasteride though he desires to hold off at this time PVR next follow-up    Glendia JAYSON Barba, MD  [facility] [address], [address] [place], [place] [address] [phone]

## 2024-05-31 NOTE — Assessment & Plan Note (Signed)
 Diagnosed in 2019, managed with watchful waiting by [facility]. With  Most recent biopsies benign.  Repeat PSA iin Dec 2024 was increased but decreased in June per Urology ; continue  semi annual repeat  Lab Results  Component Value Date   PSA1 3.6 05/26/2024   PSA1 4.2 (H) 11/28/2023   PSA1 3.6 05/24/2023   PSA 3.10 03/11/2020   PSA 4.24 (H) 02/05/2020   PSA 3.2 01/30/2019

## 2024-06-03 ENCOUNTER — Encounter: Payer: Self-pay | Admitting: Urology

## 2024-06-25 DIAGNOSIS — M9901 Segmental and somatic dysfunction of cervical region: Secondary | ICD-10-CM | POA: Diagnosis not present

## 2024-06-25 DIAGNOSIS — M5416 Radiculopathy, lumbar region: Secondary | ICD-10-CM | POA: Diagnosis not present

## 2024-06-25 DIAGNOSIS — M9903 Segmental and somatic dysfunction of lumbar region: Secondary | ICD-10-CM | POA: Diagnosis not present

## 2024-06-25 DIAGNOSIS — M542 Cervicalgia: Secondary | ICD-10-CM | POA: Diagnosis not present

## 2024-07-14 DIAGNOSIS — G8929 Other chronic pain: Secondary | ICD-10-CM | POA: Diagnosis not present

## 2024-07-14 DIAGNOSIS — M25511 Pain in right shoulder: Secondary | ICD-10-CM | POA: Diagnosis not present

## 2024-07-14 DIAGNOSIS — M7541 Impingement syndrome of right shoulder: Secondary | ICD-10-CM | POA: Diagnosis not present

## 2024-07-31 DIAGNOSIS — M9901 Segmental and somatic dysfunction of cervical region: Secondary | ICD-10-CM | POA: Diagnosis not present

## 2024-07-31 DIAGNOSIS — M9903 Segmental and somatic dysfunction of lumbar region: Secondary | ICD-10-CM | POA: Diagnosis not present

## 2024-07-31 DIAGNOSIS — M542 Cervicalgia: Secondary | ICD-10-CM | POA: Diagnosis not present

## 2024-07-31 DIAGNOSIS — M5416 Radiculopathy, lumbar region: Secondary | ICD-10-CM | POA: Diagnosis not present

## 2024-08-05 ENCOUNTER — Other Ambulatory Visit (HOSPITAL_COMMUNITY): Payer: Self-pay

## 2024-08-05 ENCOUNTER — Other Ambulatory Visit: Payer: Self-pay

## 2024-08-05 ENCOUNTER — Other Ambulatory Visit: Payer: Self-pay | Admitting: Urology

## 2024-08-05 DIAGNOSIS — C61 Malignant neoplasm of prostate: Secondary | ICD-10-CM

## 2024-08-05 MED ORDER — TAMSULOSIN HCL 0.4 MG PO CAPS
0.8000 mg | ORAL_CAPSULE | Freq: Every day | ORAL | 3 refills | Status: AC
  Filled 2024-08-05: qty 180, 90d supply, fill #0
  Filled 2024-11-02: qty 180, 90d supply, fill #1

## 2024-08-17 ENCOUNTER — Ambulatory Visit: Admitting: Internal Medicine

## 2024-08-25 ENCOUNTER — Ambulatory Visit (INDEPENDENT_AMBULATORY_CARE_PROVIDER_SITE_OTHER): Admitting: Internal Medicine

## 2024-08-25 ENCOUNTER — Encounter: Payer: Self-pay | Admitting: Internal Medicine

## 2024-08-25 VITALS — BP 126/72 | HR 71 | Ht 69.0 in | Wt 157.2 lb

## 2024-08-25 DIAGNOSIS — C61 Malignant neoplasm of prostate: Secondary | ICD-10-CM

## 2024-08-25 DIAGNOSIS — R030 Elevated blood-pressure reading, without diagnosis of hypertension: Secondary | ICD-10-CM

## 2024-08-25 DIAGNOSIS — R3915 Urgency of urination: Secondary | ICD-10-CM

## 2024-08-25 DIAGNOSIS — R7301 Impaired fasting glucose: Secondary | ICD-10-CM

## 2024-08-25 DIAGNOSIS — I6782 Cerebral ischemia: Secondary | ICD-10-CM | POA: Diagnosis not present

## 2024-08-25 DIAGNOSIS — R5383 Other fatigue: Secondary | ICD-10-CM | POA: Diagnosis not present

## 2024-08-25 DIAGNOSIS — E782 Mixed hyperlipidemia: Secondary | ICD-10-CM | POA: Diagnosis not present

## 2024-08-25 DIAGNOSIS — N401 Enlarged prostate with lower urinary tract symptoms: Secondary | ICD-10-CM | POA: Diagnosis not present

## 2024-08-25 LAB — COMPREHENSIVE METABOLIC PANEL WITH GFR
ALT: 19 U/L (ref 0–53)
AST: 18 U/L (ref 0–37)
Albumin: 4.3 g/dL (ref 3.5–5.2)
Alkaline Phosphatase: 72 U/L (ref 39–117)
BUN: 17 mg/dL (ref 6–23)
CO2: 29 meq/L (ref 19–32)
Calcium: 9.5 mg/dL (ref 8.4–10.5)
Chloride: 104 meq/L (ref 96–112)
Creatinine, Ser: 0.78 mg/dL (ref 0.40–1.50)
GFR: 89.91 mL/min (ref >60.00)
Glucose, Bld: 97 mg/dL (ref 70–99)
Potassium: 4.2 meq/L (ref 3.5–5.1)
Sodium: 139 meq/L (ref 135–145)
Total Bilirubin: 0.7 mg/dL (ref 0.2–1.2)
Total Protein: 6.4 g/dL (ref 6.0–8.3)

## 2024-08-25 LAB — LIPID PANEL
Cholesterol: 167 mg/dL (ref 0–200)
HDL: 68.5 mg/dL (ref >39.00)
LDL Cholesterol: 89 mg/dL (ref 0–99)
NonHDL: 98.46
Total CHOL/HDL Ratio: 2
Triglycerides: 48 mg/dL (ref 0.0–149.0)
VLDL: 9.6 mg/dL (ref 0.0–40.0)

## 2024-08-25 LAB — LDL CHOLESTEROL, DIRECT: Direct LDL: 89 mg/dL

## 2024-08-25 MED ORDER — FINASTERIDE 5 MG PO TABS: 5.0000 mg | ORAL_TABLET | Freq: Every day | ORAL | 0 refills | Status: DC

## 2024-08-25 NOTE — Assessment & Plan Note (Signed)
 Diagnosed in 2019, managed with watchful waiting by [facility]. With  Most recent biopsies benign.  Repeat PSA iin Dec 2024 was increased but decreased in June per Urology ; continue  semi annual repeat  Lab Results  Component Value Date   PSA1 3.6 05/26/2024   PSA1 4.2 (H) 11/28/2023   PSA1 3.6 05/24/2023   PSA 3.10 03/11/2020   PSA 4.24 (H) 02/05/2020   PSA 3.2 01/30/2019

## 2024-08-25 NOTE — Progress Notes (Unsigned)
 Subjective:  [name], male    DOB: [birth year]  Age: 73 y.o. MRN: [id number]  CC: The primary encounter diagnosis was Impaired fasting glucose. 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   HPI Edward Tran presents for  Chief Complaint  Patient presents with   Medical Management of Chronic Issues    6 month follow up     1) recent fall:  patient has been playing pickle ball,  had a fall last week  while playing and  bruised the right side of knee, shoulder,  etc  . Left knee has been tender on the medial side since then but no bruising, clicking, swelling or giving way.   2) recent crush injury to right thumb (car door ) while visiting new grandchild in [place]  last week ,  denies pain ,  nail is black but not loose  3) urinary urgency/ frequency; chronic issues.  Has to wear a pad all the time, because of occasional urge incontinence.   cramping his lifestyle.  Discussed with Stoioff his urologist who offered addition of finasteride  to tamsulosin  but deferred .  Symptoms are bothersome and lifestyle limited .  Discussed the potential side effects of orthostasis.  Would like to try medication for a month to see if he tolerates it.    Outpatient Medications Prior to Visit  Medication Sig Dispense Refill   atorvastatin  (LIPITOR) 20 MG tablet Take 1 tablet (20 mg total) by mouth daily.(Replaces simvastatin ) 90 tablet 3   loratadine  (CLARITIN ) 10 MG tablet TAKE 1 TABLET EVERY DAY 90 tablet 3   sildenafil  (REVATIO ) 20 MG tablet Take 1 tablet (20 mg total) by mouth 3 (three) times daily. 90 tablet 3   tamsulosin  (FLOMAX ) 0.4 MG CAPS capsule Take 2 capsules (0.8 mg total) by mouth daily after breakfast. 180 capsule 3   fluorouracil (EFUDEX)  5 % cream Apply 1 Application topically 2 (two) times daily.     No facility-administered medications prior to visit.    Review of Systems;  Patient denies headache, fevers, malaise, unintentional weight loss, skin rash, eye pain, sinus congestion and sinus pain, sore throat, dysphagia,  hemoptysis , cough, dyspnea, wheezing, chest pain, palpitations, orthopnea, edema, abdominal pain, nausea, melena, diarrhea, constipation, flank pain, dysuria, hematuria, urinary  Frequency, nocturia, numbness, tingling, seizures,  Focal weakness, Loss of consciousness,  Tremor, insomnia, depression, anxiety, and suicidal ideation.      Objective:  BP 126/72   Pulse 71   Ht 5' 9 (1.753 m)   Wt 157 lb 3.2 oz (71.3 kg)   SpO2 97%   BMI 23.21 kg/m   BP Readings from Last 3 Encounters:  08/25/24 126/72  05/28/24 (!) 158/84  02/14/24 120/74    Wt Readings from Last 3 Encounters:  08/25/24 157 lb 3.2 oz (71.3 kg)  05/28/24 155 lb (70.3 kg)  02/14/24 163 lb 12.8 oz (74.3 kg)    Physical Exam Vitals reviewed.  Constitutional:      General: He is not in acute distress.    Appearance: Normal appearance. He is normal weight. He is not ill-appearing, toxic-appearing or diaphoretic.  HENT:     Head: Normocephalic.  Eyes:     General: No scleral icterus.       Right eye: No discharge.  Left eye: No discharge.     Conjunctiva/sclera: Conjunctivae normal.  Cardiovascular:     Rate and Rhythm: Normal rate and regular rhythm.     Heart sounds: Normal heart sounds.  Pulmonary:     Effort: Pulmonary effort is normal. No respiratory distress.     Breath sounds: Normal breath sounds.  Musculoskeletal:        General: No swelling, tenderness or deformity. Normal range of motion.     Cervical back: Normal range of motion.     Right lower leg: No edema.     Left lower leg: No edema.  Skin:    General: Skin is warm and dry.  Neurological:     General: No focal deficit present.     Mental  Status: He is alert and oriented to person, place, and time. Mental status is at baseline.  Psychiatric:        Mood and Affect: Mood normal.        Behavior: Behavior normal.        Thought Content: Thought content normal.        Judgment: Judgment normal.     Lab Results  Component Value Date   HGBA1C 5.5 02/06/2023   HGBA1C 5.5 02/05/2020    Lab Results  Component Value Date   CREATININE 0.78 08/25/2024   CREATININE 0.81 02/14/2024   CREATININE 0.82 08/16/2023    Lab Results  Component Value Date   WBC 6.3 02/14/2024   HGB 16.1 02/14/2024   HCT 47.7 02/14/2024   PLT 205.0 02/14/2024   GLUCOSE 97 08/25/2024   CHOL 167 08/25/2024   TRIG 48.0 08/25/2024   HDL 68.50 08/25/2024   LDLDIRECT 89.0 08/25/2024   LDLCALC 89 08/25/2024   ALT 19 08/25/2024   AST 18 08/25/2024   NA 139 08/25/2024   K 4.2 08/25/2024   CL 104 08/25/2024   CREATININE 0.78 08/25/2024   BUN 17 08/25/2024   CO2 29 08/25/2024   TSH 2.20 02/14/2024   PSA 3.10 03/11/2020   HGBA1C 5.5 02/06/2023   MICROALBUR <0.7 02/14/2024    No results found.  Assessment & Plan:  .Impaired fasting glucose -     Comprehensive metabolic panel with GFR -     Comprehensive metabolic panel with GFR; Future  Mixed hyperlipidemia Assessment & Plan: His untreated lipid panel is fine, but he is taking and is tolerating statin therapy for primary prevention due to the presence of small vessel ischemic changes noted on head CT Feb 2023. .    Lab Results  Component Value Date   CHOL 173 02/14/2024   HDL 71.90 02/14/2024   LDLCALC 91 02/14/2024   LDLDIRECT 89.0 02/14/2024   TRIG 48.0 02/14/2024   CHOLHDL 2 02/14/2024     Orders: -     Lipid panel -     LDL cholesterol, direct -     Lipid panel; Future -     LDL cholesterol, direct; Future  Prostate cancer [facility]) Assessment & Plan: Diagnosed in 2019, managed with watchful waiting by [facility]. With  Most recent biopsieswere  benign.  Repeat PSA iin Dec 2024  was increased but decreased in June per Urology ; continue  semi annual repeat  Lab Results  Component Value Date   PSA1 3.6 05/26/2024   PSA1 4.2 (H) 11/28/2023   PSA1 3.6 05/24/2023   PSA 3.10 03/11/2020   PSA 4.24 (H) 02/05/2020   PSA 3.2 01/30/2019       Other fatigue -  CBC with Differential/Platelet; Future  Elevated blood pressure reading without diagnosis of hypertension Assessment & Plan: Readings have been normal . 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    Reviewed the role of diet, sleep and exercise in preventing dementia    Benign prostatic hyperplasia (BPH) with urinary urgency Assessment & Plan: Taking tamsulosin ,  adding finasteride  as a trial    Other orders -     Finasteride ; Take 1 tablet (5 mg total) by mouth daily.  Dispense: 30 tablet; Refill: 0    I personally spent a total of 30 minutes in the care of the patient today including getting/reviewing separately obtained history, reviewing evaluation by Dr Annemarie,  reviewing most recent labs, including PSA and cholesterol,  reviewing 2023 MRI brain, performing a medically appropriate exam/evaluation, counseling and educating, placing orders, independently interpreting results, and communicating results.  Follow-up: Return in about 6 months (around 02/22/2025) for hypertension.   Verneita LITTIE Kettering, MD

## 2024-08-25 NOTE — Assessment & Plan Note (Signed)
 His untreated lipid panel is fine, but he is taking and is tolerating statin therapy for primary prevention due to the presence of small vessel ischemic changes noted on head CT Feb 2023. .    Lab Results  Component Value Date   CHOL 173 02/14/2024   HDL 71.90 02/14/2024   LDLCALC 91 02/14/2024   LDLDIRECT 89.0 02/14/2024   TRIG 48.0 02/14/2024   CHOLHDL 2 02/14/2024

## 2024-08-25 NOTE — Patient Instructions (Signed)
 I have sent a 30 day rx for finasteride  to your pharmacy,  this is the medication Dr Twylla mentioned to you at your last visit .  I recommend taking 81 mg aspirin ONCE A WEEK

## 2024-08-26 ENCOUNTER — Encounter: Payer: Self-pay | Admitting: Internal Medicine

## 2024-08-26 ENCOUNTER — Ambulatory Visit: Payer: Self-pay | Admitting: Internal Medicine

## 2024-08-26 DIAGNOSIS — N401 Enlarged prostate with lower urinary tract symptoms: Secondary | ICD-10-CM | POA: Insufficient documentation

## 2024-08-26 NOTE — Assessment & Plan Note (Signed)
 Taking tamsulosin ,  adding finasteride  as a trial

## 2024-08-26 NOTE — Assessment & Plan Note (Signed)
 Readings have been normal . No indication for [facility]

## 2024-08-26 NOTE — Assessment & Plan Note (Signed)
 Continue atorvastatin , add 81 mg aspirin weekly.    Reviewed the role of diet, sleep and exercise in preventing dementia

## 2024-08-28 DIAGNOSIS — M5416 Radiculopathy, lumbar region: Secondary | ICD-10-CM | POA: Diagnosis not present

## 2024-08-28 DIAGNOSIS — M542 Cervicalgia: Secondary | ICD-10-CM | POA: Diagnosis not present

## 2024-08-28 DIAGNOSIS — M9901 Segmental and somatic dysfunction of cervical region: Secondary | ICD-10-CM | POA: Diagnosis not present

## 2024-08-28 DIAGNOSIS — M9903 Segmental and somatic dysfunction of lumbar region: Secondary | ICD-10-CM | POA: Diagnosis not present

## 2024-09-21 DIAGNOSIS — M170 Bilateral primary osteoarthritis of knee: Secondary | ICD-10-CM | POA: Diagnosis not present

## 2024-09-21 DIAGNOSIS — M1712 Unilateral primary osteoarthritis, left knee: Secondary | ICD-10-CM | POA: Diagnosis not present

## 2024-09-24 ENCOUNTER — Other Ambulatory Visit: Payer: Self-pay | Admitting: Internal Medicine

## 2024-09-25 DIAGNOSIS — M9901 Segmental and somatic dysfunction of cervical region: Secondary | ICD-10-CM | POA: Diagnosis not present

## 2024-09-25 DIAGNOSIS — M542 Cervicalgia: Secondary | ICD-10-CM | POA: Diagnosis not present

## 2024-09-25 DIAGNOSIS — M5416 Radiculopathy, lumbar region: Secondary | ICD-10-CM | POA: Diagnosis not present

## 2024-09-25 DIAGNOSIS — M9903 Segmental and somatic dysfunction of lumbar region: Secondary | ICD-10-CM | POA: Diagnosis not present

## 2024-10-03 ENCOUNTER — Encounter: Payer: Self-pay | Admitting: Internal Medicine

## 2024-10-14 DIAGNOSIS — D485 Neoplasm of uncertain behavior of skin: Secondary | ICD-10-CM | POA: Diagnosis not present

## 2024-10-14 DIAGNOSIS — D2262 Melanocytic nevi of left upper limb, including shoulder: Secondary | ICD-10-CM | POA: Diagnosis not present

## 2024-10-14 DIAGNOSIS — C44619 Basal cell carcinoma of skin of left upper limb, including shoulder: Secondary | ICD-10-CM | POA: Diagnosis not present

## 2024-10-14 DIAGNOSIS — D0461 Carcinoma in situ of skin of right upper limb, including shoulder: Secondary | ICD-10-CM | POA: Diagnosis not present

## 2024-10-14 DIAGNOSIS — L570 Actinic keratosis: Secondary | ICD-10-CM | POA: Diagnosis not present

## 2024-10-14 DIAGNOSIS — D2272 Melanocytic nevi of left lower limb, including hip: Secondary | ICD-10-CM | POA: Diagnosis not present

## 2024-10-14 DIAGNOSIS — D2271 Melanocytic nevi of right lower limb, including hip: Secondary | ICD-10-CM | POA: Diagnosis not present

## 2024-10-14 DIAGNOSIS — L821 Other seborrheic keratosis: Secondary | ICD-10-CM | POA: Diagnosis not present

## 2024-10-14 DIAGNOSIS — D2261 Melanocytic nevi of right upper limb, including shoulder: Secondary | ICD-10-CM | POA: Diagnosis not present

## 2024-10-14 DIAGNOSIS — D225 Melanocytic nevi of trunk: Secondary | ICD-10-CM | POA: Diagnosis not present

## 2024-10-22 DIAGNOSIS — M25511 Pain in right shoulder: Secondary | ICD-10-CM | POA: Diagnosis not present

## 2024-10-22 DIAGNOSIS — M5416 Radiculopathy, lumbar region: Secondary | ICD-10-CM | POA: Diagnosis not present

## 2024-10-22 DIAGNOSIS — M542 Cervicalgia: Secondary | ICD-10-CM | POA: Diagnosis not present

## 2024-10-22 DIAGNOSIS — M9901 Segmental and somatic dysfunction of cervical region: Secondary | ICD-10-CM | POA: Diagnosis not present

## 2024-10-22 DIAGNOSIS — M24811 Other specific joint derangements of right shoulder, not elsewhere classified: Secondary | ICD-10-CM | POA: Diagnosis not present

## 2024-10-22 DIAGNOSIS — M9903 Segmental and somatic dysfunction of lumbar region: Secondary | ICD-10-CM | POA: Diagnosis not present

## 2024-10-25 ENCOUNTER — Encounter: Payer: Self-pay | Admitting: Internal Medicine

## 2024-10-26 MED ORDER — SILDENAFIL CITRATE 20 MG PO TABS: 20.0000 mg | ORAL_TABLET | Freq: Three times a day (TID) | ORAL | 3 refills | Status: AC

## 2024-11-02 ENCOUNTER — Other Ambulatory Visit: Payer: Self-pay | Admitting: *Deleted

## 2024-11-02 ENCOUNTER — Other Ambulatory Visit: Payer: Self-pay | Admitting: Internal Medicine

## 2024-11-02 ENCOUNTER — Other Ambulatory Visit: Payer: Self-pay

## 2024-11-02 ENCOUNTER — Other Ambulatory Visit (HOSPITAL_COMMUNITY): Payer: Self-pay

## 2024-11-02 DIAGNOSIS — J302 Other seasonal allergic rhinitis: Secondary | ICD-10-CM

## 2024-11-02 DIAGNOSIS — C61 Malignant neoplasm of prostate: Secondary | ICD-10-CM

## 2024-11-02 MED ORDER — LORATADINE 10 MG PO TABS
ORAL_TABLET | ORAL | 2 refills | Status: AC
  Filled 2024-11-02: qty 90, 90d supply, fill #0

## 2024-11-03 ENCOUNTER — Telehealth: Payer: Self-pay

## 2024-11-03 NOTE — Telephone Encounter (Signed)
 Colonoscopy is due May 2026.  Pt called to schedule.  I've informed him that I will call him back in March/April to schedule his colonoscopy.  Thanks,  [place], CMA

## 2024-11-09 ENCOUNTER — Other Ambulatory Visit

## 2024-11-09 DIAGNOSIS — C61 Malignant neoplasm of prostate: Secondary | ICD-10-CM | POA: Diagnosis not present

## 2024-11-10 ENCOUNTER — Encounter: Payer: Self-pay | Admitting: Urology

## 2024-11-10 LAB — PSA: Prostate Specific Ag, Serum: 3.1 ng/mL (ref 0.0–4.0)

## 2024-11-22 ENCOUNTER — Other Ambulatory Visit (HOSPITAL_COMMUNITY): Payer: Self-pay

## 2025-01-04 ENCOUNTER — Ambulatory Visit

## 2025-01-06 ENCOUNTER — Ambulatory Visit

## 2025-03-23 ENCOUNTER — Ambulatory Visit

## 2025-05-26 ENCOUNTER — Other Ambulatory Visit

## 2025-05-28 ENCOUNTER — Ambulatory Visit: Admitting: Urology
